# Patient Record
Sex: Male | Born: 1940
Health system: Southern US, Community
[De-identification: ages and names within clinical notes are randomized; demographics above are authoritative.]

## PROBLEM LIST (undated history)

## (undated) DIAGNOSIS — Z87442 Personal history of urinary calculi: Secondary | ICD-10-CM

## (undated) DIAGNOSIS — C61 Malignant neoplasm of prostate: Secondary | ICD-10-CM

## (undated) DIAGNOSIS — I10 Essential (primary) hypertension: Secondary | ICD-10-CM

## (undated) DIAGNOSIS — C181 Malignant neoplasm of appendix: Secondary | ICD-10-CM

## (undated) DIAGNOSIS — N201 Calculus of ureter: Secondary | ICD-10-CM

## (undated) DIAGNOSIS — K219 Gastro-esophageal reflux disease without esophagitis: Secondary | ICD-10-CM

## (undated) DIAGNOSIS — J4540 Moderate persistent asthma, uncomplicated: Secondary | ICD-10-CM

## (undated) DIAGNOSIS — I471 Supraventricular tachycardia, unspecified: Secondary | ICD-10-CM

## (undated) DIAGNOSIS — I480 Paroxysmal atrial fibrillation: Secondary | ICD-10-CM

## (undated) DIAGNOSIS — M7120 Synovial cyst of popliteal space [Baker], unspecified knee: Secondary | ICD-10-CM

## (undated) HISTORY — DX: Malignant neoplasm of appendix: C18.1

## (undated) HISTORY — DX: Paroxysmal atrial fibrillation: I48.0

## (undated) HISTORY — DX: Synovial cyst of popliteal space (Baker), unspecified knee: M71.20

## (undated) HISTORY — DX: Moderate persistent asthma, uncomplicated: J45.40

## (undated) HISTORY — PX: APPENDECTOMY: SHX54

## (undated) HISTORY — DX: Supraventricular tachycardia, unspecified: I47.10

## (undated) HISTORY — PX: OTHER SURGICAL HISTORY: SHX169

---

## 2012-01-15 ENCOUNTER — Emergency Department (HOSPITAL_BASED_OUTPATIENT_CLINIC_OR_DEPARTMENT_OTHER): Payer: Medicare Other

## 2012-01-15 ENCOUNTER — Encounter (HOSPITAL_BASED_OUTPATIENT_CLINIC_OR_DEPARTMENT_OTHER): Payer: Self-pay | Admitting: Emergency Medicine

## 2012-01-15 ENCOUNTER — Emergency Department (HOSPITAL_BASED_OUTPATIENT_CLINIC_OR_DEPARTMENT_OTHER)
Admission: EM | Admit: 2012-01-15 | Discharge: 2012-01-15 | Disposition: A | Discharge status: Home / Self Care | Payer: Medicare Other | Attending: Emergency Medicine | Admitting: Emergency Medicine

## 2012-01-15 DIAGNOSIS — Z87891 Personal history of nicotine dependence: Secondary | ICD-10-CM | POA: Insufficient documentation

## 2012-01-15 DIAGNOSIS — M79609 Pain in unspecified limb: Secondary | ICD-10-CM | POA: Insufficient documentation

## 2012-01-15 DIAGNOSIS — M7120 Synovial cyst of popliteal space [Baker], unspecified knee: Secondary | ICD-10-CM | POA: Insufficient documentation

## 2012-01-15 NOTE — ED Notes (Signed)
Patient back from  X-ray 

## 2012-01-15 NOTE — ED Notes (Signed)
Patient transported to X-ray 

## 2012-01-15 NOTE — ED Provider Notes (Signed)
History     CSN: [id number]  Arrival date & time 01/15/12  0909   First MD Initiated Contact with Patient 01/15/12 1018      Chief Complaint  Patient presents with  . Leg Pain    Lt posterior lower leg  . Knee Pain    Lt posterior knee    (Consider location/radiation/quality/duration/timing/severity/associated sxs/prior treatment) Patient is a 71 y.o. male presenting with leg pain and knee pain. The history is provided by the patient.  Leg Pain  The incident occurred more than 2 days ago. Incident location: walking and stretching his leg when he felt a pop. The injury mechanism is unknown. The pain is present in the left knee. The quality of the pain is described as aching and throbbing. The pain is at a severity of 8/10. The pain is moderate. The pain has been constant since onset. Pertinent negatives include no numbness, no inability to bear weight, no loss of motion, no loss of sensation and no tingling. Nothing aggravates the symptoms. He has tried NSAIDs for the symptoms. The treatment provided no relief.  Knee Pain    History reviewed. No pertinent past medical history.  Past Surgical History  Procedure Date  . Appendectomy     History reviewed. No pertinent family history.  History  Substance Use Topics  . Smoking status: Former Smoker    Types: Cigarettes  . Smokeless tobacco: Not on file  . Alcohol Use: 0.6 oz/week    1 Glasses of wine per week      Review of Systems  Constitutional: Negative for fever.  Neurological: Negative for tingling, weakness and numbness.  All other systems reviewed and are negative.    Allergies  Review of patient's allergies indicates no known allergies.  Home Medications  No current outpatient prescriptions on file.  BP 153/72  Pulse 61  Temp 98.7 F (37.1 C) (Oral)  Resp 18  Ht 5\' 9"  (1.753 m)  Wt 183 lb (83.008 kg)  BMI 27.02 kg/m2  SpO2 97%  Physical Exam  Nursing note and vitals reviewed. Constitutional:  He is oriented to person, place, and time. He appears well-developed and well-nourished. No distress.  HENT:  Head: Normocephalic and atraumatic.  Eyes: EOM are normal. Pupils are equal, round, and reactive to light.  Musculoskeletal:       Left knee: He exhibits swelling. He exhibits no ecchymosis and no bony tenderness. No medial joint line, no lateral joint line, no MCL, no LCL and no patellar tendon tenderness noted.       Legs: Neurological: He is alert and oriented to person, place, and time. He has normal strength. No sensory deficit.  Skin: Skin is warm and dry. No rash noted. No erythema.    ED Course  Procedures (including critical care time)  Labs Reviewed - No data to display Dg Knee 1-2 Views Left  01/15/2012  *RADIOLOGY REPORT*  Clinical Data: Knee pain.  No known injury.  LEFT KNEE - 1-2 VIEW  Comparison: None  Findings: Early degenerative changes with early spurring in the medial and patellofemoral compartments.  There may be a small joint effusion. No acute bony abnormality.  Specifically, no fracture, subluxation, or dislocation.  Soft tissues are intact.  IMPRESSION: No acute bony abnormality.   Original Report Authenticated By: Cyndie Chime, M.D.    [place] Extrem Low Left Comp  01/15/2012  *RADIOLOGY REPORT*  Clinical Data: Posterior knee pain, swelling.  ULTRASOUND LEFT LOWER EXTREMITY LIMITED  Technique:  Ultrasound  examination of the region of interest in the left lower extremity was performed.  Comparison:  None.  Findings: There is a complex cystic collection in the popliteal fossa measuring 8.1 x 4.1 x 2.6 cm.  Internal septations and debris.  No internal blood flow.  Findings compatible with large complex Baker's cyst.  IMPRESSION: 8.1 cm complex left Baker's cyst.   Original Report Authenticated By: Cyndie Chime, M.D.      1. Baker's cyst of knee       MDM   Patient with pain and swelling behind the left knee. He states his knee and felt tight for months to  3 days ago he felt a pop and now has a swelling. Concern for possible ruptured Baker's cyst. He has significant swelling and pain in the posterior aspect of the left knee. He is able to flex and extend his knee normally. Neurovascularly intact. No external signs of bruising and normal function of the calf muscle as well as thigh and hamstring.  10:44 AM Will start with plain films of the knee.  12:03 PM Knee films are unremarkable follow up ultrasound showed an 8.1 cm Baker cyst. Patient was given followup with Dr. Alford Highland, MD 01/15/12 [phone]

## 2012-01-15 NOTE — ED Notes (Signed)
Patient transported to Ultrasound 

## 2012-01-15 NOTE — ED Notes (Signed)
Pt reports Lt knee pain/ Lt leg pain. Pt denies injury. Pt states occasional feeling numbness.

## 2012-01-18 ENCOUNTER — Other Ambulatory Visit (HOSPITAL_COMMUNITY): Payer: Self-pay | Admitting: Orthopedic Surgery

## 2012-01-18 DIAGNOSIS — M7120 Synovial cyst of popliteal space [Baker], unspecified knee: Secondary | ICD-10-CM

## 2012-01-19 ENCOUNTER — Other Ambulatory Visit: Payer: Self-pay | Admitting: Radiology

## 2012-01-22 ENCOUNTER — Ambulatory Visit (HOSPITAL_COMMUNITY)
Admission: RE | Admit: 2012-01-22 | Discharge: 2012-01-22 | Disposition: A | Discharge status: Home / Self Care | Payer: Medicare Other | Source: Ambulatory Visit | Attending: Orthopedic Surgery | Admitting: Orthopedic Surgery

## 2012-01-22 ENCOUNTER — Ambulatory Visit (HOSPITAL_COMMUNITY): Payer: Medicare Other

## 2012-01-22 DIAGNOSIS — M7120 Synovial cyst of popliteal space [Baker], unspecified knee: Secondary | ICD-10-CM

## 2012-01-22 NOTE — Procedures (Signed)
US aspiration Bakers Cyst Left 25ml pale yellow fluid No complication No blood loss. See complete dictation in [facility]

## 2012-04-18 ENCOUNTER — Encounter: Payer: Self-pay | Admitting: Critical Care Medicine

## 2012-04-18 ENCOUNTER — Ambulatory Visit (INDEPENDENT_AMBULATORY_CARE_PROVIDER_SITE_OTHER): Payer: Medicare Other | Admitting: Critical Care Medicine

## 2012-04-18 ENCOUNTER — Ambulatory Visit (HOSPITAL_BASED_OUTPATIENT_CLINIC_OR_DEPARTMENT_OTHER)
Admission: RE | Admit: 2012-04-18 | Discharge: 2012-04-18 | Disposition: A | Discharge status: Home / Self Care | Payer: Medicare Other | Source: Ambulatory Visit | Attending: Critical Care Medicine | Admitting: Critical Care Medicine

## 2012-04-18 VITALS — BP 150/88 | HR 77 | Temp 97.6°F | Ht 70.0 in | Wt 194.0 lb

## 2012-04-18 DIAGNOSIS — K219 Gastro-esophageal reflux disease without esophagitis: Secondary | ICD-10-CM

## 2012-04-18 DIAGNOSIS — R0600 Dyspnea, unspecified: Secondary | ICD-10-CM

## 2012-04-18 DIAGNOSIS — R1310 Dysphagia, unspecified: Secondary | ICD-10-CM

## 2012-04-18 DIAGNOSIS — J45902 Unspecified asthma with status asthmaticus: Secondary | ICD-10-CM

## 2012-04-18 DIAGNOSIS — R0602 Shortness of breath: Secondary | ICD-10-CM | POA: Insufficient documentation

## 2012-04-18 DIAGNOSIS — R0989 Other specified symptoms and signs involving the circulatory and respiratory systems: Secondary | ICD-10-CM

## 2012-04-18 MED ORDER — ALBUTEROL SULFATE HFA 108 (90 BASE) MCG/ACT IN AERS: 2.0000 | INHALATION_SPRAY | Freq: Four times a day (QID) | RESPIRATORY_TRACT | Status: DC | PRN

## 2012-04-18 MED ORDER — OMEPRAZOLE 20 MG PO CPDR: 20.0000 mg | DELAYED_RELEASE_CAPSULE | Freq: Every day | ORAL | Status: DC

## 2012-04-18 MED ORDER — MOMETASONE FUROATE 220 MCG/INH IN AEPB: 2.0000 | INHALATION_SPRAY | Freq: Every day | RESPIRATORY_TRACT | Status: DC

## 2012-04-18 NOTE — Assessment & Plan Note (Signed)
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 In concerned about the possibility of esophageal stricture driving the patient's dyspnea and wheezing. Note pulmonary function show mild to moderate airway obstruction.  Plan Start Asmanex two puff daily Albuterol 1-2 puff every 4 hours as needed for wheezing or short of breath Start omeprazole 20mg  daily Follow strict reflux diet Obtain an esophageal barium swallow, will do at [facility]

## 2012-04-18 NOTE — Progress Notes (Signed)
Quick Note:  Notify the patient that the Xray is stable and no pneumonia No change in medications are recommended. Continue current meds as prescribed at last office visit ______ 

## 2012-04-18 NOTE — Progress Notes (Signed)
Subjective:    Patient ID: John Livingston, male    DOB: 25-Feb-1941, 72 y.o.   MRN: [id number]  HPI Comments: Dyspnea complaint with exertion only. 56yrs hx  Shortness of Breath This is a chronic problem. The current episode started more than 1 year ago. The problem occurs daily (exertional only, getting worse, working, exercise, going up hill). The problem has been gradually worsening. The average episode lasts 15 minutes. Associated symptoms include PND, rhinorrhea and wheezing. Pertinent negatives include no abdominal pain, chest pain, claudication, coryza, ear pain, fever, headaches, hemoptysis, leg pain, leg swelling, neck pain, orthopnea, rash, sore throat, sputum production or vomiting. The symptoms are aggravated by any activity, exercise and lying flat. Associated symptoms comments: Pt notes nocturnal wheezing and will awaken with same . Risk factors include smoking. He has tried nothing for the symptoms. There is no history of allergies, aspirin allergies, asthma, bronchiolitis, CAD, chronic lung disease, COPD, DVT, a heart failure, PE, pneumonia or a recent surgery.   Past Medical History  Diagnosis Date  . Baker's cyst of knee     left  . Dyspnea   . Cancer of appendix      Family History  Problem Relation Age of Onset  . Asthma Son     as a child  . Heart disease Mother   . Prostate cancer Father      History   Social History  . Marital Status: Single    Spouse Name: N/A    Number of Children: N/A  . Years of Education: N/A   Occupational History  . Buisness Owner     [employer]   Social History Main Topics  . Smoking status: Former Smoker -- 1.0 packs/day for 20 years    Types: Cigarettes    Quit date: 03/20/1978  . Smokeless tobacco: Never Used  . Alcohol Use: 0.6 oz/week    1 Glasses of wine per week     Comment: a glass of wine nightly  . Drug Use: No  . Sexually Active: Not on file   Other Topics Concern  . Not on file   Social History  Narrative  . No narrative on file     No Known Allergies   No outpatient prescriptions prior to visit.    Last reviewed on 04/18/2012 11:19 AM by Storm Frisk, MD      Review of Systems  Constitutional: Negative for fever, chills, diaphoresis, activity change, appetite change, fatigue and unexpected weight change.  HENT: Positive for hearing loss, rhinorrhea, trouble swallowing and tinnitus. Negative for ear pain, nosebleeds, congestion, sore throat, facial swelling, sneezing, mouth sores, neck pain, neck stiffness, dental problem, voice change, postnasal drip, sinus pressure and ear discharge.        Diff swallow bread.  Feels like is stuck in the throat Prior Esophageal dilation 2-3 yrs ago, cannot remember the MD name at [facility]  Eyes: Negative for photophobia, discharge, itching and visual disturbance.  Respiratory: Positive for shortness of breath and wheezing. Negative for apnea, cough, hemoptysis, sputum production, choking, chest tightness and stridor.   Cardiovascular: Positive for palpitations and PND. Negative for chest pain, orthopnea, claudication and leg swelling.  Gastrointestinal: Negative for nausea, vomiting, abdominal pain, constipation, blood in stool and abdominal distention.  Genitourinary: Negative for dysuria, urgency, frequency, hematuria, flank pain, decreased urine volume and difficulty urinating.  Musculoskeletal: Negative for myalgias, back pain, joint swelling, arthralgias and gait problem.  Skin: Negative for color change, pallor and rash.  Neurological: Negative for dizziness, tremors, seizures, syncope, speech difficulty, weakness, light-headedness, numbness and headaches.  Hematological: Negative for adenopathy. Does not bruise/bleed easily.  Psychiatric/Behavioral: Negative for confusion, sleep disturbance and agitation. The patient is not nervous/anxious.        Objective:   Physical Exam Filed Vitals:   04/18/12 1058  BP: 150/88  Pulse: 77   Temp: 97.6 F (36.4 C)  TempSrc: Oral  Height: 5\' 10"  (1.778 m)  Weight: 194 lb (87.998 kg)  SpO2: 92%    Gen: Pleasant, well-nourished, in no distress,  normal affect  ENT: No lesions,  mouth clear,  oropharynx clear, no postnasal drip  Neck: No JVD, no TMG, no carotid bruits  Lungs: No use of accessory muscles, no dullness to percussion, expired wheezes and lower airway and moderate pseudo-wheeze and upper airway  Cardiovascular: RRR, heart sounds normal, no murmur or gallops, no peripheral edema  Abdomen: soft and NT, no HSM,  BS normal  Musculoskeletal: No deformities, no cyanosis or clubbing  Neuro: alert, non focal  Skin: Warm, no lesions or rashes  Dg Chest 2 View  04/18/2012  *RADIOLOGY REPORT*  Clinical Data: Shortness of breath  CHEST - 2 VIEW  Comparison: None.  Findings: Cardiomediastinal silhouette is unremarkable.  No acute infiltrate or pleural effusion.  No pulmonary edema.  Bony thorax is unremarkable.  IMPRESSION: No active disease.   Original Report Authenticated By: Natasha Mead, M.D.    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 In concerned about the possibility of esophageal stricture driving the patient's dyspnea and wheezing. Note pulmonary function show mild to moderate airway obstruction.  Plan Start Asmanex two puff daily Albuterol 1-2 puff every 4 hours as needed for wheezing or short of breath Start omeprazole 20mg  daily Follow strict reflux diet Obtain an esophageal barium swallow, will do at [facility]   Updated Medication List Outpatient Encounter Prescriptions as of 04/18/2012  Medication Sig Dispense Refill  . Aromatic Inhalants (VICKS VAPOR INHALER IN) Inhale into the lungs at bedtime.      .  clobetasol cream (TEMOVATE) 0.05 % Apply topically. As directed      . Doxylamine Succinate, Sleep, (SLEEP AID PO) Take 2 tablets by mouth at bedtime.      Marland Kitchen albuterol (PROVENTIL HFA;VENTOLIN HFA) 108 (90 BASE) MCG/ACT inhaler Inhale 2 puffs into the lungs every 6 (six) hours as needed for wheezing or shortness of breath.  1 Inhaler  6  . mometasone (ASMANEX 120 METERED DOSES) 220 MCG/INH inhaler Inhale 2 puffs into the lungs daily.  1 Inhaler  12  . omeprazole (PRILOSEC) 20 MG capsule Take 1 capsule (20 mg total) by mouth daily.  30 capsule  4

## 2012-04-18 NOTE — Patient Instructions (Addendum)
Start Asmanex two puff daily Albuterol 1-2 puff every 4 hours as needed for wheezing or short of breath Start omeprazole 20mg  daily Follow strict reflux diet Obtain an esophageal barium swallow, will do at [facility] Obtain Chest xray today Return 6 weeks for recheck

## 2012-04-19 ENCOUNTER — Other Ambulatory Visit: Payer: Self-pay | Admitting: Critical Care Medicine

## 2012-04-19 DIAGNOSIS — R1310 Dysphagia, unspecified: Secondary | ICD-10-CM

## 2012-04-22 ENCOUNTER — Telehealth: Payer: Self-pay | Admitting: Critical Care Medicine

## 2012-04-22 NOTE — Telephone Encounter (Signed)
John Livingston has gone for the day, I will put cpt code on referral form, so pt can have this barium swallow @ [facility] .Kandice Hams

## 2012-04-22 NOTE — Progress Notes (Signed)
Quick Note:  Called, spoke with pt. Informed him of cxr results and recs per Dr. Wright. He verbalized understanding of this and voiced no further questions or concerns at this time. ______ 

## 2012-04-22 NOTE — Telephone Encounter (Signed)
Spoke with Steward Drone @ [facility] pt has esophageal barium swallow scheduled 04/24/12, informed her dx is Dysphagia; 787.20. They also need CPT code to [place] put [place] referral [place] [address] which she says is fl Barium swallow xray. In order for pt to have this done they need this documented on referral form. For [facility] orders.  Crystal can you please document this on order.  I can bring the faxed order to you and it can be handwritten on order form.  Thanks!

## 2012-04-23 NOTE — Telephone Encounter (Signed)
Per Alida, this has been taken care of.  Nothing further needed at this time.

## 2012-04-30 ENCOUNTER — Telehealth: Payer: Self-pay | Admitting: Critical Care Medicine

## 2012-04-30 NOTE — Telephone Encounter (Signed)
Call pt and tell him barium swallow was NORMAL.  No swallowing or esophagus issues. No medication changes Keep next OV

## 2012-05-01 NOTE — Telephone Encounter (Signed)
Called, spoke with pt.  Informed him of barium swallow results and recs per PW.  He verbalized understanding of this, is aware to keep scheduled OV on March 13 at 9 am in [facility], and voiced no further questions or concerns at this time.

## 2012-05-20 ENCOUNTER — Encounter: Payer: Self-pay | Admitting: Critical Care Medicine

## 2012-05-30 ENCOUNTER — Ambulatory Visit (INDEPENDENT_AMBULATORY_CARE_PROVIDER_SITE_OTHER): Payer: Medicare Other | Admitting: Critical Care Medicine

## 2012-05-30 ENCOUNTER — Encounter: Payer: Self-pay | Admitting: Critical Care Medicine

## 2012-05-30 VITALS — BP 118/78 | HR 60 | Temp 97.8°F | Ht 69.5 in | Wt 194.0 lb

## 2012-05-30 DIAGNOSIS — J45902 Unspecified asthma with status asthmaticus: Secondary | ICD-10-CM

## 2012-05-30 NOTE — Patient Instructions (Addendum)
Continue asmanex two puff daily Return 6 months

## 2012-05-30 NOTE — Progress Notes (Signed)
Subjective:    Patient ID: John Livingston, male    DOB: [birth year], 72 y.o.   MRN: [id number]  HPI 05/30/2012 Since her last visit the patient is improved. The patient had been using Asmanex regularly but note is that the samples lockup when he twists the device. He does have a prescription from the pharmacy and is just now picked this up. The patient denies any cough wheezing or excess mucus production. The patient had a barium swallow which showed no stricture and mild reflux with vital hernia noted no other source of dysphagia seen. Pt denies any significant sore throat, nasal congestion or excess secretions, fever, chills, sweats, unintended weight loss, pleurtic or exertional chest pain, orthopnea PND, or leg swelling Pt denies any increase in rescue therapy over baseline, denies waking up needing it or having any early am or nocturnal exacerbations of coughing/wheezing/or dyspnea. Pt also denies any obvious fluctuation in symptoms with  weather or environmental change or other alleviating or aggravating factors  PUL ASTHMA HISTORY 05/30/2012  Symptoms 0-2 days/week  Nighttime awakenings 0-2/month  Interference with activity Minor limitations  SABA use 0-2 days/wk  Exacerbations requiring oral steroids 0-1 / year     Past Medical History  Diagnosis Date  . Baker's cyst of knee     left  . Moderate persistent asthma   . Cancer of appendix      Family History  Problem Relation Age of Onset  . Asthma Son     as a child  . Heart disease Mother   . Prostate cancer Father      History   Social History  . Marital Status: Single    Spouse Name: N/A    Number of Children: N/A  . Years of Education: N/A   Occupational History  . Buisness Owner     [employer]   Social History Main Topics  . Smoking status: Former Smoker -- 1.00 packs/day for 20 years    Types: Cigarettes    Quit date: 03/20/1978  . Smokeless tobacco: Never Used  . Alcohol Use: 0.6 oz/week    1  Glasses of wine per week     Comment: a glass of wine nightly  . Drug Use: No  . Sexually Active: Not on file   Other Topics Concern  . Not on file   Social History Narrative  . No narrative on file     No Known Allergies   Outpatient Prescriptions Prior to Visit  Medication Sig Dispense Refill  . albuterol (PROVENTIL HFA;VENTOLIN HFA) 108 (90 BASE) MCG/ACT inhaler Inhale 2 puffs into the lungs every 6 (six) hours as needed for wheezing or shortness of breath.  1 Inhaler  6  . Aromatic Inhalants (VICKS VAPOR INHALER IN) Inhale into the lungs at bedtime.      . clobetasol cream (TEMOVATE) 0.05 % Apply topically. As directed      . Doxylamine Succinate, Sleep, (SLEEP AID PO) Take 2 tablets by mouth at bedtime.      Marland Kitchen omeprazole (PRILOSEC) 20 MG capsule Take 1 capsule (20 mg total) by mouth daily.  30 capsule  4  . mometasone (ASMANEX 120 METERED DOSES) 220 MCG/INH inhaler Inhale 2 puffs into the lungs daily.  1 Inhaler  12   No facility-administered medications prior to visit.        Review of Systems  Constitutional: Negative for chills, diaphoresis, activity change, appetite change, fatigue and unexpected weight change.  HENT: Positive for hearing loss,  trouble swallowing and tinnitus. Negative for nosebleeds, congestion, facial swelling, sneezing, mouth sores, neck stiffness, dental problem, voice change, postnasal drip, sinus pressure and ear discharge.        Diff swallow bread.  Feels like is stuck in the throat Prior Esophageal dilation 2-3 yrs ago, cannot remember the MD name at [facility]  Eyes: Negative for photophobia, discharge, itching and visual disturbance.  Respiratory: Negative for apnea, cough, choking, chest tightness and stridor.   Cardiovascular: Positive for palpitations.  Gastrointestinal: Negative for nausea, constipation, blood in stool and abdominal distention.  Genitourinary: Negative for dysuria, urgency, frequency, hematuria, flank pain, decreased urine  volume and difficulty urinating.  Musculoskeletal: Negative for myalgias, back pain, joint swelling, arthralgias and gait problem.  Skin: Negative for color change and pallor.  Neurological: Negative for dizziness, tremors, seizures, syncope, speech difficulty, weakness, light-headedness and numbness.  Hematological: Negative for adenopathy. Does not bruise/bleed easily.  Psychiatric/Behavioral: Negative for confusion, sleep disturbance and agitation. The patient is not nervous/anxious.        Objective:   Physical Exam  Filed Vitals:   05/30/12 0906  BP: 118/78  Pulse: 60  Temp: 97.8 F (36.6 C)  TempSrc: Oral  Height: 5' 9.5" (1.765 m)  Weight: 194 lb (87.998 kg)  SpO2: 98%    Gen: Pleasant, well-nourished, in no distress,  normal affect  ENT: No lesions,  mouth clear,  oropharynx clear, no postnasal drip  Neck: No JVD, no TMG, no carotid bruits  Lungs: No use of accessory muscles, no dullness to percussion, Much clearer with decreased wheeze  Cardiovascular: RRR, heart sounds normal, no murmur or gallops, no peripheral edema  Abdomen: soft and NT, no HSM,  BS normal  Musculoskeletal: No deformities, no cyanosis or clubbing  Neuro: alert, non focal  Skin: Warm, no lesions or rashes      Assessment & Plan:   Moderate persistent asthma with reflux disease Moderate persistent asthma with inability to use Asmanex inhaler due to mechanical failure of device GERD trigger of reflux Plan The patient was reinstructed as to proper use of the dry powdered inhaler device and additional samples were issued If the patient continues to have difficulty operating this device the patient will be migrated to different inhaled steroid Continued proton pump inhibitor for reflux which is a major trigger for the patient's asthma Return 6 months    Updated Medication List Outpatient Encounter Prescriptions as of 05/30/2012  Medication Sig Dispense Refill  . albuterol (PROVENTIL  HFA;VENTOLIN HFA) 108 (90 BASE) MCG/ACT inhaler Inhale 2 puffs into the lungs every 6 (six) hours as needed for wheezing or shortness of breath.  1 Inhaler  6  . Aromatic Inhalants (VICKS VAPOR INHALER IN) Inhale into the lungs at bedtime.      . clobetasol cream (TEMOVATE) 0.05 % Apply topically. As directed      . Doxylamine Succinate, Sleep, (SLEEP AID PO) Take 2 tablets by mouth at bedtime.      Marland Kitchen omeprazole (PRILOSEC) 20 MG capsule Take 1 capsule (20 mg total) by mouth daily.  30 capsule  4  . TRAVATAN Z 0.004 % SOLN ophthalmic solution Place 1 drop into both eyes 2 (two) times daily.      . mometasone (ASMANEX 120 METERED DOSES) 220 MCG/INH inhaler Inhale 2 puffs into the lungs daily.  1 Inhaler  12   No facility-administered encounter medications on file as of 05/30/2012.

## 2012-05-30 NOTE — Assessment & Plan Note (Signed)
Moderate persistent asthma with inability to use Asmanex inhaler due to mechanical failure of device GERD trigger of reflux Plan The patient was reinstructed as to proper use of the dry powdered inhaler device and additional samples were issued If the patient continues to have difficulty operating this device the patient will be migrated to different inhaled steroid Continued proton pump inhibitor for reflux which is a major trigger for the patient's asthma Return 6 months

## 2012-11-12 ENCOUNTER — Telehealth: Payer: Self-pay | Admitting: Critical Care Medicine

## 2012-11-12 NOTE — Telephone Encounter (Signed)
pt states he no longer needs to see PW. Will call us if he needs [place] in the future.

## 2013-04-29 ENCOUNTER — Emergency Department (HOSPITAL_BASED_OUTPATIENT_CLINIC_OR_DEPARTMENT_OTHER)
Admission: EM | Admit: 2013-04-29 | Discharge: 2013-04-30 | Disposition: A | Discharge status: Home / Self Care | Payer: Medicare Other | Attending: Emergency Medicine | Admitting: Emergency Medicine

## 2013-04-29 ENCOUNTER — Emergency Department (HOSPITAL_BASED_OUTPATIENT_CLINIC_OR_DEPARTMENT_OTHER): Payer: Medicare Other

## 2013-04-29 ENCOUNTER — Encounter (HOSPITAL_BASED_OUTPATIENT_CLINIC_OR_DEPARTMENT_OTHER): Payer: Self-pay | Admitting: Emergency Medicine

## 2013-04-29 DIAGNOSIS — Z79899 Other long term (current) drug therapy: Secondary | ICD-10-CM | POA: Insufficient documentation

## 2013-04-29 DIAGNOSIS — J45909 Unspecified asthma, uncomplicated: Secondary | ICD-10-CM | POA: Insufficient documentation

## 2013-04-29 DIAGNOSIS — K219 Gastro-esophageal reflux disease without esophagitis: Secondary | ICD-10-CM | POA: Insufficient documentation

## 2013-04-29 DIAGNOSIS — Z8509 Personal history of malignant neoplasm of other digestive organs: Secondary | ICD-10-CM | POA: Insufficient documentation

## 2013-04-29 DIAGNOSIS — Z87891 Personal history of nicotine dependence: Secondary | ICD-10-CM | POA: Insufficient documentation

## 2013-04-29 DIAGNOSIS — I10 Essential (primary) hypertension: Secondary | ICD-10-CM | POA: Insufficient documentation

## 2013-04-29 DIAGNOSIS — Z8739 Personal history of other diseases of the musculoskeletal system and connective tissue: Secondary | ICD-10-CM | POA: Insufficient documentation

## 2013-04-29 DIAGNOSIS — IMO0002 Reserved for concepts with insufficient information to code with codable children: Secondary | ICD-10-CM | POA: Insufficient documentation

## 2013-04-29 DIAGNOSIS — K859 Acute pancreatitis without necrosis or infection, unspecified: Secondary | ICD-10-CM

## 2013-04-29 LAB — URINALYSIS, ROUTINE W REFLEX MICROSCOPIC
BILIRUBIN URINE: NEGATIVE
Glucose, UA: NEGATIVE mg/dL
HGB URINE DIPSTICK: NEGATIVE
Ketones, ur: NEGATIVE mg/dL
Leukocytes, UA: NEGATIVE
Nitrite: NEGATIVE
PROTEIN: NEGATIVE mg/dL
Specific Gravity, Urine: 1.026 (ref 1.005–1.030)
UROBILINOGEN UA: 1 mg/dL (ref 0.0–1.0)
pH: 5.5 (ref 5.0–8.0)

## 2013-04-29 LAB — COMPREHENSIVE METABOLIC PANEL
ALT: 13 U/L (ref 0–53)
AST: 17 U/L (ref 0–37)
Albumin: 4.2 g/dL (ref 3.5–5.2)
Alkaline Phosphatase: 71 U/L (ref 39–117)
BUN: 16 mg/dL (ref 6–23)
CALCIUM: 9.3 mg/dL (ref 8.4–10.5)
CO2: 26 mEq/L (ref 19–32)
Chloride: 105 mEq/L (ref 96–112)
Creatinine, Ser: 1.1 mg/dL (ref 0.50–1.35)
GFR calc non Af Amer: 64 mL/min — ABNORMAL LOW (ref >90)
GFR, EST AFRICAN AMERICAN: 74 mL/min — AB (ref >90)
GLUCOSE: 147 mg/dL — AB (ref 70–99)
Potassium: 3.7 mEq/L (ref 3.7–5.3)
SODIUM: 144 meq/L (ref 137–147)
TOTAL PROTEIN: 7.4 g/dL (ref 6.0–8.3)
Total Bilirubin: 0.4 mg/dL (ref 0.3–1.2)

## 2013-04-29 LAB — CBC WITH DIFFERENTIAL/PLATELET
BASOS ABS: 0 10*3/uL (ref 0.0–0.1)
Basophils Relative: 1 % (ref 0–1)
EOS ABS: 0.3 10*3/uL (ref 0.0–0.7)
EOS PCT: 5 % (ref 0–5)
HCT: 40.8 % (ref 39.0–52.0)
Hemoglobin: 14.5 g/dL (ref 13.0–17.0)
Lymphocytes Relative: 26 % (ref 12–46)
Lymphs Abs: 1.6 10*3/uL (ref 0.7–4.0)
MCH: 32.1 pg (ref 26.0–34.0)
MCHC: 35.5 g/dL (ref 30.0–36.0)
MCV: 90.3 fL (ref 78.0–100.0)
Monocytes Absolute: 0.9 10*3/uL (ref 0.1–1.0)
Monocytes Relative: 16 % — ABNORMAL HIGH (ref 3–12)
Neutro Abs: 3.2 10*3/uL (ref 1.7–7.7)
Neutrophils Relative %: 53 % (ref 43–77)
PLATELETS: 185 10*3/uL (ref 150–400)
RBC: 4.52 MIL/uL (ref 4.22–5.81)
RDW: 13.5 % (ref 11.5–15.5)
WBC: 6 10*3/uL (ref 4.0–10.5)

## 2013-04-29 LAB — CG4 I-STAT (LACTIC ACID): Lactic Acid, Venous: 1.21 mmol/L (ref 0.5–2.2)

## 2013-04-29 LAB — TROPONIN I: Troponin I: <0.3 ng/mL (ref <0.30)

## 2013-04-29 LAB — LIPASE, BLOOD: Lipase: 69 U/L — ABNORMAL HIGH (ref 11–59)

## 2013-04-29 MED ORDER — ONDANSETRON HCL 4 MG/2ML IJ SOLN
4.0000 mg | Freq: Once | INTRAMUSCULAR | Status: AC
  Administered 2013-04-29: 4 mg via INTRAVENOUS
  Filled 2013-04-29: qty 2

## 2013-04-29 MED ORDER — IOHEXOL 350 MG/ML SOLN
100.0000 mL | Freq: Once | INTRAVENOUS | Status: AC | PRN
  Administered 2013-04-29: 100 mL via INTRAVENOUS

## 2013-04-29 MED ORDER — MORPHINE SULFATE 4 MG/ML IJ SOLN
4.0000 mg | Freq: Once | INTRAMUSCULAR | Status: AC
  Administered 2013-04-29: 4 mg via INTRAVENOUS
  Filled 2013-04-29: qty 1

## 2013-04-29 MED ORDER — GI COCKTAIL ~~LOC~~
30.0000 mL | Freq: Once | ORAL | Status: AC
  Administered 2013-04-29: 30 mL via ORAL
  Filled 2013-04-29: qty 30

## 2013-04-29 NOTE — ED Notes (Signed)
Pt complains epigastric pain that started about 1 hour ago that radiates into back.  Denies n/v/d.

## 2013-04-29 NOTE — ED Provider Notes (Signed)
CSN: [id number]     Arrival date & time 04/29/13  2136 History  This chart was scribed for John Dessert, MD by Celesta Gentile, ED Scribe. The patient was seen in room [address]. Patient's care was started at 9:52 PM.    Chief Complaint  Patient presents with  . Abdominal Pain   Patient is a 73 y.o. male presenting with abdominal pain. The history is provided by the patient. No language interpreter was used.  Abdominal Pain Pain location:  Epigastric Pain radiates to:  Back Pain severity:  Moderate Onset quality:  Sudden Duration:  1 hour Timing:  Constant Progression:  Worsening Chronicity:  New Relieved by:  Nothing Worsened by:  Deep breathing and palpation Ineffective treatments:  None tried Associated symptoms: no chest pain, no chills, no cough, no diarrhea, no fever, no nausea, no shortness of breath, no sore throat and no vomiting   Risk factors: no alcohol abuse and not obese    HPI Comments: KERWIN AUGUSTUS is a 73 y.o. male who presents to the Emergency Department complaining of constant epigastric abdominal/ lower substernal chest pain that started about an hour ago while the pt was sitting on the floor sorting through boxes.  Pt states that the pain radiates into his upper back.  Pt denies SOB.  Pt states the pain is worsened with palpation and when he breathes out.  Pt denies pain below his navel area.  Pt states that he last ate around 6:00PM.  He states that he had fried chicken without the skin, collard greens with vinegar, and bread.  Pt denies diarrhea and nausea.  Pt states that he had an appendectomy.  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  Pt states that he drinks 1 glass of wine daily.  Pt denies smoking.  Pt denies any heart conditions.  Pt denies diabetes and HTN.  However high blood pressure here.  Past Medical History  Diagnosis Date  . Baker's cyst of knee     left  . Moderate persistent asthma   .  Cancer of appendix    Past Surgical History  Procedure Laterality Date  . Appendectomy    . Cartilage surgery in nose     Family History  Problem Relation Age of Onset  . Asthma Son     as a child  . Heart disease Mother   . Prostate cancer Father    History  Substance Use Topics  . Smoking status: Former Smoker -- 1.00 packs/day for 20 years    Types: Cigarettes    Quit date: 03/20/1978  . Smokeless tobacco: Never Used  . Alcohol Use: 0.6 oz/week    1 Glasses of wine per week     Comment: a glass of wine nightly    Review of Systems  Constitutional: Negative for fever and chills.  HENT: Negative for congestion, rhinorrhea and sore throat.   Respiratory: Negative for cough and shortness of breath.   Cardiovascular: Negative for chest pain.  Gastrointestinal: Positive for abdominal pain. Negative for nausea, vomiting and diarrhea.  Musculoskeletal: Negative for back pain.  Skin: Negative for color change and rash.  Neurological: Negative for syncope.  Psychiatric/Behavioral: Negative for behavioral problems and confusion.  All other systems reviewed and are negative.      Allergies  Review of patient's allergies indicates no known allergies.  Home Medications   Current Outpatient Rx  Name  Route  Sig  Dispense  Refill  . albuterol (PROVENTIL HFA;VENTOLIN HFA) 108 (90 BASE) MCG/ACT inhaler   Inhalation   Inhale 2 puffs into the lungs every 6 (six) hours as needed for wheezing or shortness of breath.   1 Inhaler   6   . Aromatic Inhalants (VICKS VAPOR INHALER IN)   Inhalation   Inhale into the lungs at bedtime.         . clobetasol cream (TEMOVATE) 0.05 %   Topical   Apply topically. As directed         . Doxylamine Succinate, Sleep, (SLEEP AID PO)   Oral   Take 2 tablets by mouth at bedtime.         . mometasone (ASMANEX 120 METERED DOSES) 220 MCG/INH inhaler   Inhalation   Inhale 2 puffs into the lungs daily.   1 Inhaler   12   . omeprazole  (PRILOSEC) 20 MG capsule   Oral   Take 1 capsule (20 mg total) by mouth daily.   30 capsule   4   . TRAVATAN Z 0.004 % SOLN ophthalmic solution   Both Eyes   Place 1 drop into both eyes 2 (two) times daily.          Triage Vitals: BP 218/92  Pulse 69  Temp(Src) 98.1 F (36.7 C) (Oral)  Resp 16  Ht 5\' 9"  (1.753 m)  Wt 196 lb (88.905 kg)  BMI 28.93 kg/m2  SpO2 97% Physical Exam  Nursing note and vitals reviewed. Constitutional: He is oriented to person, place, and time. He appears well-developed and well-nourished. No distress.  HENT:  Head: Normocephalic and atraumatic.  Eyes: Conjunctivae and EOM are normal. Right eye exhibits no discharge. Left eye exhibits no discharge.  Neck: Neck supple. No tracheal deviation present.  Cardiovascular: Normal rate, regular rhythm and normal heart sounds.  Exam reveals no gallop and no friction rub.   No murmur heard. Pulmonary/Chest: Effort normal. No respiratory distress. He has no wheezes. He has no rales. He exhibits no tenderness.  Abdominal: Soft. He exhibits no distension and no mass. There is no tenderness. There is no rebound and no guarding.  Musculoskeletal: Normal range of motion.  Neurological: He is alert and oriented to person, place, and time.  Skin: Skin is warm and dry. No rash noted.  Psychiatric: He has a normal mood and affect. His behavior is normal. Judgment and thought content normal.    ED Course  Procedures (including critical care time) DIAGNOSTIC STUDIES: Oxygen Saturation is 97% on RA, normal by my interpretation.    COORDINATION OF CARE: 10:09 PM-Will order UA, chest x-ray, and Comprehensive metabolic panel.  Patient informed of current plan of treatment and evaluation and agrees with plan.     Labs Review Labs Reviewed  CBC WITH DIFFERENTIAL - Abnormal; Notable for the following:    Monocytes Relative 16 (*)    All other components within normal limits  COMPREHENSIVE METABOLIC PANEL - Abnormal;  Notable for the following:    Glucose, Bld 147 (*)    GFR calc non Af Amer 64 (*)    GFR calc Af Amer 74 (*)    All other components within normal limits  LIPASE, BLOOD - Abnormal; Notable for the following:    Lipase 69 (*)    All other components within normal limits  TROPONIN I  URINALYSIS, ROUTINE W REFLEX MICROSCOPIC  CG4 I-STAT (LACTIC ACID)   Imaging Review Dg Chest 2 View  04/29/2013   CLINICAL DATA:  Chest pain  EXAM: CHEST  2 VIEW  COMPARISON:  April 18, 2012  FINDINGS: The heart size and mediastinal contours are within normal limits. The aorta is tortuous. There is no focal infiltrate, pulmonary edema, or pleural effusion. The visualized skeletal structures are stable.  IMPRESSION: No active cardiopulmonary disease.   Electronically Signed   By: Abelardo Diesel M.D.   On: 04/29/2013 22:48    EKG Interpretation    Date/Time:  Tuesday April 29 2013 21:41:48 EST Ventricular Rate:  65 PR Interval:  176 QRS Duration: 108 QT Interval:  436 QTC Calculation: 453 R Axis:   69 Text Interpretation:  Normal sinus rhythm Normal ECG No significant change since last tracing Confirmed by Maryan Rued  MD, Wilhelmenia Addis ([id number]) on 04/29/2013 10:11:28 PM            MDM   Final diagnoses:  None   Patient presents with sudden onset of lower substernal and epigastric chest pain that radiates into the upper back that started approximately one hour ago. He has no other associated symptoms and is well appearing. However patient is hypertensive today. He has no prior history of hypertension and sees his PCP every 6 months for evaluation. Blood pressure is similar in bilateral upper extremities with normal pulses and new description appearing chest pain or pain radiating into the extremities. Patient does have a history of GERD and takes omeprazole.  EKG is within normal limits today. Pulses are intact in all 4 extremities. Symptoms could be related to esophageal spasm versus gallbladder  pathology however patient does not have significant right upper quadrant pain. Low suspicion for ACS given patient's symptoms and description of pain however pt has new significant HTN and dissection is also a possibility.  will evaluate chest x-ray and troponin. Low suspicion for mesenteric ischemia as patient is not truly complaining of abdominal tenderness. He denies any bowel or bladder complaints.  No radiation of pain into the limbs Patient given a GI cocktail to see if any improvement in the pain.  CBC, CMP, lipase, troponin, lactate, chest x-ray pending.  10:44 PM No improvement with GI cocktail.  Labs without significant findings except for mild elevation of lipase of 69.  CXR with tortuous aorta but no other findings.  Will get CTA of chest/abd pelvis for further evaluation.  Pt given morphine for pain.  I personally performed the services described in this documentation, which was scribed in my presence.  The recorded information has been reviewed and considered.    John Dessert, MD 05/01/13 [phone]

## 2013-04-29 NOTE — ED Notes (Signed)
Pt c/o diffuse abd pain and lower back pain x 1 hr

## 2013-04-30 MED ORDER — OXYCODONE-ACETAMINOPHEN 5-325 MG PO TABS: 1.0000 | ORAL_TABLET | Freq: Four times a day (QID) | ORAL | Status: DC | PRN

## 2013-04-30 MED ORDER — MORPHINE SULFATE 2 MG/ML IJ SOLN
2.0000 mg | Freq: Once | INTRAMUSCULAR | Status: AC
Start: 2013-04-30 — End: 2013-04-30
  Administered 2013-04-30: 2 mg via INTRAVENOUS

## 2013-04-30 MED ORDER — HYDROCHLOROTHIAZIDE 25 MG PO TABS: 25.0000 mg | ORAL_TABLET | Freq: Every day | ORAL | Status: DC

## 2013-04-30 MED ORDER — MORPHINE SULFATE 2 MG/ML IJ SOLN
INTRAMUSCULAR | Status: AC
  Filled 2013-04-30: qty 1

## 2013-04-30 NOTE — Discharge Instructions (Signed)
Acute Pancreatitis °Acute pancreatitis is a disease in which the pancreas becomes suddenly inflamed. The pancreas is a large gland located behind your stomach. The pancreas produces enzymes that help digest food. The pancreas also releases the hormones glucagon and insulin that help regulate blood sugar. Damage to the pancreas occurs when the digestive enzymes from the pancreas are activated and begin attacking the pancreas before being released into the intestine. Most acute attacks last a couple of days and can cause serious complications. Some people become dehydrated and develop low blood pressure. In severe cases, bleeding into the pancreas can lead to shock and can be life-threatening. The lungs, heart, and kidneys may fail. °CAUSES  °Pancreatitis can happen to anyone. In some cases, the cause is unknown. Most cases are caused by: °· Alcohol abuse. °· Gallstones. °Other less common causes are: °· Certain medicines. °· Exposure to certain chemicals. °· Infection. °· Damage caused by an accident (trauma). °· Abdominal surgery. °SYMPTOMS  °· Pain in the upper abdomen that may radiate to the back. °· Tenderness and swelling of the abdomen. °· Nausea and vomiting. °DIAGNOSIS  °Your caregiver will perform a physical exam. Blood and stool tests may be done to confirm the diagnosis. Imaging tests may also be done, such as X-rays, CT scans, or an ultrasound of the abdomen. °TREATMENT  °Treatment usually requires a stay in the hospital. Treatment may include: °· Pain medicine. °· Fluid replacement through an intravenous line (IV). °· Placing a tube in the stomach to remove stomach contents and control vomiting. °· Not eating for 3 or 4 days. This gives your pancreas a rest, because enzymes are not being produced that can cause further damage. °· Antibiotic medicines if your condition is caused by an infection. °· Surgery of the pancreas or gallbladder. °HOME CARE INSTRUCTIONS  °· Follow the diet advised by your  caregiver. This may involve avoiding alcohol and decreasing the amount of fat in your diet. °· Eat smaller, more frequent meals. This reduces the amount of digestive juices the pancreas produces. °· Drink enough fluids to keep your urine clear or pale yellow. °· Only take over-the-counter or prescription medicines as directed by your caregiver. °· Avoid drinking alcohol if it caused your condition. °· Do not smoke. °· Get plenty of rest. °· Check your blood sugar at home as directed by your caregiver. °· Keep all follow-up appointments as directed by your caregiver. °SEEK MEDICAL CARE IF:  °· You do not recover as quickly as expected. °· You develop new or worsening symptoms. °· You have persistent pain, weakness, or nausea. °· You recover and then have another episode of pain. °SEEK IMMEDIATE MEDICAL CARE IF:  °· You are unable to eat or keep fluids down. °· Your pain becomes severe. °· You have a fever or persistent symptoms for more than 2 to 3 days. °· You have a fever and your symptoms suddenly get worse. °· Your skin or the white part of your eyes turn yellow (jaundice). °· You develop vomiting. °· You feel dizzy, or you faint. °· Your blood sugar is high (over 300 mg/dL). °MAKE SURE YOU:  °· Understand these instructions. °· Will watch your condition. °· Will get help right away if you are not doing well or get worse. °Document Released: 03/06/2005 Document Revised: 09/05/2011 Document Reviewed: 06/15/2011 °[employer]® Patient Information ©2014 [employer]. ° °

## 2013-08-02 ENCOUNTER — Emergency Department (HOSPITAL_BASED_OUTPATIENT_CLINIC_OR_DEPARTMENT_OTHER)
Admission: EM | Admit: 2013-08-02 | Discharge: 2013-08-02 | Disposition: A | Discharge status: Home / Self Care | Payer: Medicare Other | Attending: Emergency Medicine | Admitting: Emergency Medicine

## 2013-08-02 ENCOUNTER — Encounter (HOSPITAL_BASED_OUTPATIENT_CLINIC_OR_DEPARTMENT_OTHER): Payer: Self-pay | Admitting: Emergency Medicine

## 2013-08-02 ENCOUNTER — Emergency Department (HOSPITAL_BASED_OUTPATIENT_CLINIC_OR_DEPARTMENT_OTHER): Payer: Medicare Other

## 2013-08-02 DIAGNOSIS — Z87891 Personal history of nicotine dependence: Secondary | ICD-10-CM | POA: Insufficient documentation

## 2013-08-02 DIAGNOSIS — N201 Calculus of ureter: Secondary | ICD-10-CM | POA: Insufficient documentation

## 2013-08-02 DIAGNOSIS — Z8509 Personal history of malignant neoplasm of other digestive organs: Secondary | ICD-10-CM | POA: Insufficient documentation

## 2013-08-02 DIAGNOSIS — Z79899 Other long term (current) drug therapy: Secondary | ICD-10-CM | POA: Insufficient documentation

## 2013-08-02 DIAGNOSIS — Z791 Long term (current) use of non-steroidal anti-inflammatories (NSAID): Secondary | ICD-10-CM | POA: Insufficient documentation

## 2013-08-02 DIAGNOSIS — Z87448 Personal history of other diseases of urinary system: Secondary | ICD-10-CM | POA: Insufficient documentation

## 2013-08-02 DIAGNOSIS — J45909 Unspecified asthma, uncomplicated: Secondary | ICD-10-CM | POA: Insufficient documentation

## 2013-08-02 LAB — URINE MICROSCOPIC-ADD ON

## 2013-08-02 LAB — URINALYSIS, ROUTINE W REFLEX MICROSCOPIC
BILIRUBIN URINE: NEGATIVE
Glucose, UA: NEGATIVE mg/dL
Ketones, ur: NEGATIVE mg/dL
Leukocytes, UA: NEGATIVE
Nitrite: NEGATIVE
Protein, ur: NEGATIVE mg/dL
Specific Gravity, Urine: 1.021 (ref 1.005–1.030)
Urobilinogen, UA: 0.2 mg/dL (ref 0.0–1.0)
pH: 5 (ref 5.0–8.0)

## 2013-08-02 LAB — CBC WITH DIFFERENTIAL/PLATELET
Basophils Absolute: 0 10*3/uL (ref 0.0–0.1)
Basophils Relative: 1 % (ref 0–1)
EOS ABS: 0.3 10*3/uL (ref 0.0–0.7)
Eosinophils Relative: 5 % (ref 0–5)
HCT: 40.6 % (ref 39.0–52.0)
HEMOGLOBIN: 14.6 g/dL (ref 13.0–17.0)
LYMPHS ABS: 1.1 10*3/uL (ref 0.7–4.0)
LYMPHS PCT: 23 % (ref 12–46)
MCH: 32.4 pg (ref 26.0–34.0)
MCHC: 36 g/dL (ref 30.0–36.0)
MCV: 90.2 fL (ref 78.0–100.0)
Monocytes Absolute: 0.8 10*3/uL (ref 0.1–1.0)
Monocytes Relative: 17 % — ABNORMAL HIGH (ref 3–12)
NEUTROS PCT: 55 % (ref 43–77)
Neutro Abs: 2.6 10*3/uL (ref 1.7–7.7)
Platelets: 190 10*3/uL (ref 150–400)
RBC: 4.5 MIL/uL (ref 4.22–5.81)
RDW: 13.5 % (ref 11.5–15.5)
WBC: 4.8 10*3/uL (ref 4.0–10.5)

## 2013-08-02 LAB — BASIC METABOLIC PANEL
BUN: 18 mg/dL (ref 6–23)
CALCIUM: 9.5 mg/dL (ref 8.4–10.5)
CO2: 26 meq/L (ref 19–32)
Chloride: 103 mEq/L (ref 96–112)
Creatinine, Ser: 1.1 mg/dL (ref 0.50–1.35)
GFR calc non Af Amer: 64 mL/min — ABNORMAL LOW (ref >90)
GFR, EST AFRICAN AMERICAN: 74 mL/min — AB (ref >90)
GLUCOSE: 125 mg/dL — AB (ref 70–99)
POTASSIUM: 4 meq/L (ref 3.7–5.3)
Sodium: 141 mEq/L (ref 137–147)

## 2013-08-02 MED ORDER — ONDANSETRON 4 MG PO TBDP: 4.0000 mg | ORAL_TABLET | Freq: Three times a day (TID) | ORAL | Status: DC | PRN

## 2013-08-02 MED ORDER — HYDROCODONE-ACETAMINOPHEN 5-325 MG PO TABS: 1.0000 | ORAL_TABLET | Freq: Four times a day (QID) | ORAL | Status: DC | PRN

## 2013-08-02 NOTE — ED Provider Notes (Signed)
CSN: [id number]     Arrival date & time 08/02/13  1102 History   First MD Initiated Contact with Patient 08/02/13 1123     Chief Complaint  Patient presents with  . Flank Pain     (Consider location/radiation/quality/duration/timing/severity/associated sxs/prior Treatment) Patient is a 73 y.o. male presenting with flank pain. The history is provided by the patient.  Flank Pain Associated symptoms include abdominal pain. Pertinent negatives include no chest pain, no headaches and no shortness of breath.   patient with onset of a right-sided flank pain CVA pain started about 3 in the morning. Would resolve and then come back. Patient currently pain free. Patient's had a history of kidney stones in the past has a urologist in [place] area. Not associated with any nausea or vomiting. No fevers. No blood in the urine.  Past Medical History  Diagnosis Date  . Baker's cyst of knee     left  . Moderate persistent asthma   . Cancer of appendix   . Renal disorder    Past Surgical History  Procedure Laterality Date  . Appendectomy    . Cartilage surgery in nose     Family History  Problem Relation Age of Onset  . Asthma Son     as a child  . Heart disease Mother   . Prostate cancer Father    History  Substance Use Topics  . Smoking status: Former Smoker -- 1.00 packs/day for 20 years    Types: Cigarettes    Quit date: 03/20/1978  . Smokeless tobacco: Never Used  . Alcohol Use: 0.6 oz/week    1 Glasses of wine per week     Comment: a glass of wine nightly    Review of Systems  Constitutional: Negative for fever.  HENT: Negative for congestion.   Eyes: Negative for visual disturbance.  Respiratory: Negative for shortness of breath.   Cardiovascular: Negative for chest pain.  Gastrointestinal: Positive for abdominal pain. Negative for nausea and vomiting.  Genitourinary: Positive for dysuria and flank pain.  Musculoskeletal: Positive for back pain.  Skin: Positive for  rash.  Neurological: Negative for headaches.  Hematological: Does not bruise/bleed easily.  Psychiatric/Behavioral: Negative for confusion.      Allergies  Review of patient's allergies indicates no known allergies.  Home Medications   Prior to Admission medications   Medication Sig Start Date End Date Taking? Authorizing Provider  albuterol (PROVENTIL HFA;VENTOLIN HFA) 108 (90 BASE) MCG/ACT inhaler Inhale 2 puffs into the lungs every 6 (six) hours as needed for wheezing or shortness of breath. 04/18/12  Yes Elsie Stain, MD  vardenafil (LEVITRA) 10 MG tablet Take 10 mg by mouth daily as needed for erectile dysfunction.   Yes Historical Provider, MD  Aromatic Inhalants (VICKS VAPOR INHALER IN) Inhale into the lungs at bedtime.    Historical Provider, MD  clobetasol cream (TEMOVATE) 0.05 % Apply topically. As directed    Historical Provider, MD  Doxylamine Succinate, Sleep, (SLEEP AID PO) Take 2 tablets by mouth at bedtime.    Historical Provider, MD  hydrochlorothiazide (HYDRODIURIL) 25 MG tablet Take 1 tablet (25 mg total) by mouth daily. 04/30/13   April Alfonso Patten, MD  HYDROcodone-acetaminophen (NORCO/VICODIN) 5-325 MG per tablet Take 1-2 tablets by mouth every 6 (six) hours as needed for moderate pain. 08/02/13   Mervin Kung, MD  mometasone (ASMANEX 120 METERED DOSES) 220 MCG/INH inhaler Inhale 2 puffs into the lungs daily. 04/18/12   Elsie Stain, MD  omeprazole (PRILOSEC) 20 MG capsule Take 1 capsule (20 mg total) by mouth daily. 04/18/12   Elsie Stain, MD  ondansetron (ZOFRAN ODT) 4 MG disintegrating tablet Take 1 tablet (4 mg total) by mouth every 8 (eight) hours as needed for nausea or vomiting. 08/02/13   Mervin Kung, MD  oxyCODONE-acetaminophen (PERCOCET) 5-325 MG per tablet Take 1 tablet by mouth every 6 (six) hours as needed. 04/30/13   April K Palumbo-Rasch, MD  TRAVATAN Z 0.004 % SOLN ophthalmic solution Place 1 drop into both eyes 2 (two) times  daily.    Historical Provider, MD   BP 145/86  Pulse 64  Temp(Src) 97.4 F (36.3 C) (Oral)  Resp 18  Ht 5\' 9"  (1.753 m)  Wt 190 lb (86.183 kg)  BMI 28.05 kg/m2  SpO2 94% Physical Exam  Nursing note and vitals reviewed. Constitutional: He is oriented to person, place, and time. He appears well-developed and well-nourished. No distress.  HENT:  Head: Normocephalic and atraumatic.  Mouth/Throat: Oropharynx is clear and moist.  Eyes: Conjunctivae and EOM are normal. Pupils are equal, round, and reactive to light.  Neck: Normal range of motion.  Cardiovascular: Normal rate, regular rhythm and normal heart sounds.   Pulmonary/Chest: Effort normal and breath sounds normal. No respiratory distress.  Abdominal: Soft. Bowel sounds are normal. There is no tenderness.  Musculoskeletal: Normal range of motion. He exhibits no edema.  Neurological: He is alert and oriented to person, place, and time. No cranial nerve deficit. He exhibits normal muscle tone. Coordination normal.  Skin: Skin is warm. No erythema.    ED Course  Procedures (including critical care time) Labs Review Labs Reviewed  URINALYSIS, ROUTINE W REFLEX MICROSCOPIC - Abnormal; Notable for the following:    Hgb urine dipstick MODERATE (*)    All other components within normal limits  CBC WITH DIFFERENTIAL - Abnormal; Notable for the following:    Monocytes Relative 17 (*)    All other components within normal limits  BASIC METABOLIC PANEL - Abnormal; Notable for the following:    Glucose, Bld 125 (*)    GFR calc non Af Amer 64 (*)    GFR calc Af Amer 74 (*)    All other components within normal limits  URINE MICROSCOPIC-ADD ON - Abnormal; Notable for the following:    Bacteria, UA MANY (*)    Casts HYALINE CASTS (*)    All other components within normal limits   Results for orders placed during the hospital encounter of 08/02/13  URINALYSIS, ROUTINE W REFLEX MICROSCOPIC      Result Value Ref Range   Color, Urine  YELLOW  YELLOW   APPearance CLEAR  CLEAR   Specific Gravity, Urine 1.021  1.005 - 1.030   pH 5.0  5.0 - 8.0   Glucose, UA NEGATIVE  NEGATIVE mg/dL   Hgb urine dipstick MODERATE (*) NEGATIVE   Bilirubin Urine NEGATIVE  NEGATIVE   Ketones, ur NEGATIVE  NEGATIVE mg/dL   Protein, ur NEGATIVE  NEGATIVE mg/dL   Urobilinogen, UA 0.2  0.0 - 1.0 mg/dL   Nitrite NEGATIVE  NEGATIVE   Leukocytes, UA NEGATIVE  NEGATIVE  CBC WITH DIFFERENTIAL      Result Value Ref Range   WBC 4.8  4.0 - 10.5 K/uL   RBC 4.50  4.22 - 5.81 MIL/uL   Hemoglobin 14.6  13.0 - 17.0 g/dL   HCT 40.6  39.0 - 52.0 %   MCV 90.2  78.0 - 100.0 fL  MCH 32.4  26.0 - 34.0 pg   MCHC 36.0  30.0 - 36.0 g/dL   RDW 13.5  11.5 - 15.5 %   Platelets 190  150 - 400 K/uL   Neutrophils Relative % 55  43 - 77 %   Neutro Abs 2.6  1.7 - 7.7 K/uL   Lymphocytes Relative 23  12 - 46 %   Lymphs Abs 1.1  0.7 - 4.0 K/uL   Monocytes Relative 17 (*) 3 - 12 %   Monocytes Absolute 0.8  0.1 - 1.0 K/uL   Eosinophils Relative 5  0 - 5 %   Eosinophils Absolute 0.3  0.0 - 0.7 K/uL   Basophils Relative 1  0 - 1 %   Basophils Absolute 0.0  0.0 - 0.1 K/uL  BASIC METABOLIC PANEL      Result Value Ref Range   Sodium 141  137 - 147 mEq/L   Potassium 4.0  3.7 - 5.3 mEq/L   Chloride 103  96 - 112 mEq/L   CO2 26  19 - 32 mEq/L   Glucose, Bld 125 (*) 70 - 99 mg/dL   BUN 18  6 - 23 mg/dL   Creatinine, Ser 1.10  0.50 - 1.35 mg/dL   Calcium 9.5  8.4 - 10.5 mg/dL   GFR calc non Af Amer 64 (*) >90 mL/min   GFR calc Af Amer 74 (*) >90 mL/min  URINE MICROSCOPIC-ADD ON      Result Value Ref Range   Squamous Epithelial / LPF RARE  RARE   WBC, UA 0-2  <3 WBC/hpf   RBC / HPF 11-20  <3 RBC/hpf   Bacteria, UA MANY (*) RARE   Casts HYALINE CASTS (*) NEGATIVE   Urine-Other MUCOUS PRESENT       Imaging Review Ct Abdomen Pelvis Wo Contrast  08/02/2013   CLINICAL DATA:  Right flank pain for 24 hr  EXAM: CT ABDOMEN AND PELVIS WITHOUT CONTRAST  TECHNIQUE:  Multidetector CT imaging of the abdomen and pelvis was performed following the standard protocol without IV contrast.  COMPARISON:  CT CTA ABD/PEL W/CM AND/OR W/O CM dated 04/29/2013  FINDINGS: Curvilinear lingular scarring or atelectasis reidentified. Lung bases otherwise clear. No pleural effusion.  Moderate atheromatous aortic calcification with minimal infrarenal aortic ectasia measuring 2.1 cm image 36. Moderate bilateral perinephric stranding is noted. There is mild right hydroureteronephrosis to the level of the right ureteral vesicular junction. A 2 mm calculus is identified at or adjacent to the region of the right ureteral vesicular junction image 77. No left ureteral radiopaque calculus is identified. Nonobstructing 2 mm right upper pole renal calculus identified image 27.  Unenhanced gallbladder, spleen, pancreas, and adrenal glands are unremarkable. Hepatic granuloma incidentally noted in the otherwise normal-appearing liver. Large duodenal diverticulum incidentally noted image 28. No lymphadenopathy, free air, or ascites.  Clip from presumed appendectomy noted. No bowel wall thickening or focal segmental dilatation. Fat containing umbilical hernia. Colonic diverticuli noted without evidence for diverticulitis. Mild prostatomegaly, 4.8 cm. No acute osseous abnormality. Multilevel disc degenerative change noted in the lumbar spine.  IMPRESSION: 2 mm calculus at the right ureterovesicular junction versus immediately adjacent to it within the bladder, with mild right hydroureteronephrosis.   Electronically Signed   By: Conchita Paris M.D.   On: 08/02/2013 12:23     EKG Interpretation None      MDM   Final diagnoses:  Right ureteral stone   CT scan is consistent with right ureteral stone may be  in the bladder. Patient currently without symptoms. Patient has urologist. The followup with. Patient's had kidney stones in the past. Nontoxic no acute distress. Urinalysis shows some hematuria  consistent with the finding of the stone.    Mervin Kung, MD 08/02/13 [phone]

## 2013-08-02 NOTE — Discharge Instructions (Signed)
Kidney Stones Kidney stones (urolithiasis) are solid masses that form inside your kidneys. The intense pain is caused by the stone moving through the kidney, ureter, bladder, and urethra (urinary tract). When the stone moves, the ureter starts to spasm around the stone. The stone is usually passed in your pee (urine).  HOME CARE  Drink enough fluids to keep your pee clear or pale yellow. This helps to get the stone out.  Strain all pee through the provided strainer. Do not pee without peeing through the strainer, not even once. If you pee the stone out, catch it in the strainer. The stone may be as small as a grain of salt. Take this to your doctor. This will help your doctor figure out what you can do to try to prevent more kidney stones.  Only take medicine as told by your doctor.  Follow up with your doctor as told.  Get follow-up X-rays as told by your doctor. GET HELP IF: You have pain that gets worse even if you have been taking pain medicine. GET HELP RIGHT AWAY IF:   Your pain does not get better with medicine.  You have a fever or shaking chills.  Your pain increases and gets worse over 18 hours.  You have new belly (abdominal) pain.  You feel faint or pass out.  You are unable to pee. MAKE SURE YOU:   Understand these instructions.  Will watch your condition.  Will get help right away if you are not doing well or get worse. Document Released: 08/23/2007 Document Revised: 11/06/2012 Document Reviewed: 08/07/2012 [facility] Patient Information 2014 [place], [place].  Ureteral Colic Ureteral colic is spasm-like pain from the kidney or the ureter. This is often caused by a kidney stone. The pain is caused by the stone trying to get through the tubes that pass your pee. HOME CARE   Drink enough fluids to keep your pee (urine) clear or pale yellow.  Strain all your pee. A strainer will be provided. Keep anything caught in the [place] and bring it to your doctor. The  stone causing the pain may be very small.  Only take medicine as told by your doctor.  Follow up with your doctor as told. GET HELP RIGHT AWAY IF:   Pain is not controlled with medicine.  Pain continues or gets worse.  The pain changes and there is chest or belly (abdominal) pain.  You pass out (faint).  You cannot pee.  You keep throwing up (vomiting).  You have a temperature by mouth above 102 F (38.9 C), not controlled by medicine. MAKE SURE YOU:   Understand these instructions.  Will watch this condition.  Will get help right away if you are not doing well or get worse. Document Released: 08/23/2007 Document Revised: 05/29/2011 Document Reviewed: 08/23/2007 [facility] Patient Information 2014 [place], [place].  A kidney stone is either near the bladder or in the bladder. If you develop recurrent pain that wasn't quite in the bladder. Take pain medicine and antinausea medicine as needed. Make a point to follow up with your urologist at [facility]. Return for any newer worse symptoms.

## 2013-08-02 NOTE — ED Notes (Signed)
Right flank pain, started last night. States he has had a kidney stone in the past and this feels the same.

## 2014-08-30 ENCOUNTER — Emergency Department (HOSPITAL_BASED_OUTPATIENT_CLINIC_OR_DEPARTMENT_OTHER)
Admission: EM | Admit: 2014-08-30 | Discharge: 2014-08-30 | Disposition: A | Discharge status: Other Acute Inpatient Hospital | Payer: Medicare Other | Attending: Emergency Medicine | Admitting: Emergency Medicine

## 2014-08-30 ENCOUNTER — Emergency Department (HOSPITAL_BASED_OUTPATIENT_CLINIC_OR_DEPARTMENT_OTHER): Payer: Medicare Other

## 2014-08-30 ENCOUNTER — Encounter (HOSPITAL_BASED_OUTPATIENT_CLINIC_OR_DEPARTMENT_OTHER): Payer: Self-pay | Admitting: Emergency Medicine

## 2014-08-30 DIAGNOSIS — J4540 Moderate persistent asthma, uncomplicated: Secondary | ICD-10-CM | POA: Diagnosis not present

## 2014-08-30 DIAGNOSIS — I10 Essential (primary) hypertension: Secondary | ICD-10-CM | POA: Insufficient documentation

## 2014-08-30 DIAGNOSIS — Z87442 Personal history of urinary calculi: Secondary | ICD-10-CM | POA: Insufficient documentation

## 2014-08-30 DIAGNOSIS — Z8739 Personal history of other diseases of the musculoskeletal system and connective tissue: Secondary | ICD-10-CM | POA: Diagnosis not present

## 2014-08-30 DIAGNOSIS — Z7951 Long term (current) use of inhaled steroids: Secondary | ICD-10-CM | POA: Diagnosis not present

## 2014-08-30 DIAGNOSIS — R079 Chest pain, unspecified: Secondary | ICD-10-CM | POA: Diagnosis not present

## 2014-08-30 DIAGNOSIS — Z87891 Personal history of nicotine dependence: Secondary | ICD-10-CM | POA: Insufficient documentation

## 2014-08-30 DIAGNOSIS — Z79899 Other long term (current) drug therapy: Secondary | ICD-10-CM | POA: Diagnosis not present

## 2014-08-30 DIAGNOSIS — Z8509 Personal history of malignant neoplasm of other digestive organs: Secondary | ICD-10-CM | POA: Insufficient documentation

## 2014-08-30 HISTORY — DX: Calculus of ureter: N20.1

## 2014-08-30 LAB — MAGNESIUM: MAGNESIUM: 2.3 mg/dL (ref 1.7–2.4)

## 2014-08-30 LAB — CBC
HEMATOCRIT: 40.2 % (ref 39.0–52.0)
Hemoglobin: 14 g/dL (ref 13.0–17.0)
MCH: 31.5 pg (ref 26.0–34.0)
MCHC: 34.8 g/dL (ref 30.0–36.0)
MCV: 90.3 fL (ref 78.0–100.0)
Platelets: 205 10*3/uL (ref 150–400)
RBC: 4.45 MIL/uL (ref 4.22–5.81)
RDW: 13.6 % (ref 11.5–15.5)
WBC: 5.3 10*3/uL (ref 4.0–10.5)

## 2014-08-30 LAB — COMPREHENSIVE METABOLIC PANEL
ALT: 17 U/L (ref 17–63)
ANION GAP: 7 (ref 5–15)
AST: 23 U/L (ref 15–41)
Albumin: 4.4 g/dL (ref 3.5–5.0)
Alkaline Phosphatase: 64 U/L (ref 38–126)
BUN: 19 mg/dL (ref 6–20)
CALCIUM: 9.4 mg/dL (ref 8.9–10.3)
CO2: 24 mmol/L (ref 22–32)
Chloride: 106 mmol/L (ref 101–111)
Creatinine, Ser: 1.13 mg/dL (ref 0.61–1.24)
Glucose, Bld: 119 mg/dL — ABNORMAL HIGH (ref 65–99)
Potassium: 3.9 mmol/L (ref 3.5–5.1)
Sodium: 137 mmol/L (ref 135–145)
TOTAL PROTEIN: 7.4 g/dL (ref 6.5–8.1)
Total Bilirubin: 0.3 mg/dL (ref 0.3–1.2)

## 2014-08-30 LAB — TROPONIN I: Troponin I: <0.03 ng/mL (ref <0.031)

## 2014-08-30 MED ORDER — MORPHINE SULFATE 2 MG/ML IJ SOLN
2.0000 mg | Freq: Once | INTRAMUSCULAR | Status: AC
  Administered 2014-08-30: 2 mg via INTRAVENOUS
  Filled 2014-08-30: qty 1

## 2014-08-30 MED ORDER — SODIUM CHLORIDE 0.9 % IV BOLUS (SEPSIS)
500.0000 mL | Freq: Once | INTRAVENOUS | Status: AC
  Administered 2014-08-30: 500 mL via INTRAVENOUS

## 2014-08-30 MED ORDER — ASPIRIN 81 MG PO CHEW
CHEWABLE_TABLET | ORAL | Status: AC
  Administered 2014-08-30: 324 mg via ORAL
  Filled 2014-08-30: qty 3

## 2014-08-30 MED ORDER — ASPIRIN 81 MG PO CHEW
324.0000 mg | CHEWABLE_TABLET | Freq: Once | ORAL | Status: DC
  Administered 2014-08-30: 324 mg via ORAL

## 2014-08-30 MED ORDER — NITROGLYCERIN 0.4 MG SL SUBL
0.4000 mg | SUBLINGUAL_TABLET | SUBLINGUAL | Status: AC | PRN
  Administered 2014-08-30 (×3): 0.4 mg via SUBLINGUAL

## 2014-08-30 MED ORDER — ASPIRIN 81 MG PO CHEW: 324.0000 mg | CHEWABLE_TABLET | Freq: Once | ORAL | Status: AC

## 2014-08-30 NOTE — ED Notes (Signed)
MD at bedside. Informed of pts cont c/o mid, non radiating chest pressure, 12 lead EKG repeated

## 2014-08-30 NOTE — ED Provider Notes (Signed)
CSN: [id number]     Arrival date & time 08/30/14  1950 History   First MD Initiated Contact with Patient 08/30/14 [phone]     Chief Complaint  Patient presents with  . Chest Pain     (Consider location/radiation/quality/duration/timing/severity/associated sxs/prior Treatment) HPI  This is a 74 year old male with a history of hypertension and a remote history of smoking, having quit in the 1980s. He is here with chest pain that awakened him from sleep approximately 5:50 AM. The pain is located in the lower substernal region, is described as a pressure and is rated as a 6 out of 10. There is no associated diaphoresis, shortness of breath or nausea. There are no known modifying factors.  Past Medical History  Diagnosis Date  . Baker's cyst of knee     left  . Moderate persistent asthma   . Cancer of appendix   . Ureteral stone    Past Surgical History  Procedure Laterality Date  . Appendectomy    . Cartilage surgery in nose     Family History  Problem Relation Age of Onset  . Asthma Son     as a child  . Heart disease Mother   . Prostate cancer Father    History  Substance Use Topics  . Smoking status: Former Smoker -- 1.00 packs/day for 20 years    Types: Cigarettes    Quit date: 03/20/1978  . Smokeless tobacco: Never Used  . Alcohol Use: 0.6 oz/week    1 Glasses of wine per week     Comment: a glass of wine nightly    Review of Systems  All other systems reviewed and are negative.   Allergies  Review of patient's allergies indicates no known allergies.  Home Medications   Prior to Admission medications   Medication Sig Start Date End Date Taking? Authorizing Provider  albuterol (PROVENTIL HFA;VENTOLIN HFA) 108 (90 BASE) MCG/ACT inhaler Inhale 2 puffs into the lungs every 6 (six) hours as needed for wheezing or shortness of breath. 04/18/12   Elsie Stain, MD  Aromatic Inhalants (VICKS VAPOR INHALER IN) Inhale into the lungs at bedtime.    Historical Provider, MD   clobetasol cream (TEMOVATE) 0.05 % Apply topically. As directed    Historical Provider, MD  Doxylamine Succinate, Sleep, (SLEEP AID PO) Take 2 tablets by mouth at bedtime.    Historical Provider, MD  hydrochlorothiazide (HYDRODIURIL) 25 MG tablet Take 1 tablet (25 mg total) by mouth daily. 04/30/13   April Palumbo, MD  HYDROcodone-acetaminophen (NORCO/VICODIN) 5-325 MG per tablet Take 1-2 tablets by mouth every 6 (six) hours as needed for moderate pain. 08/02/13   Fredia Sorrow, MD  mometasone (ASMANEX 120 METERED DOSES) 220 MCG/INH inhaler Inhale 2 puffs into the lungs daily. 04/18/12   Elsie Stain, MD  omeprazole (PRILOSEC) 20 MG capsule Take 1 capsule (20 mg total) by mouth daily. 04/18/12   Elsie Stain, MD  ondansetron (ZOFRAN ODT) 4 MG disintegrating tablet Take 1 tablet (4 mg total) by mouth every 8 (eight) hours as needed for nausea or vomiting. 08/02/13   Fredia Sorrow, MD  oxyCODONE-acetaminophen (PERCOCET) 5-325 MG per tablet Take 1 tablet by mouth every 6 (six) hours as needed. 04/30/13   April Palumbo, MD  TRAVATAN Z 0.004 % SOLN ophthalmic solution Place 1 drop into both eyes 2 (two) times daily.    Historical Provider, MD  vardenafil (LEVITRA) 10 MG tablet Take 10 mg by mouth daily as needed for erectile dysfunction.  Historical Provider, MD   BP 111/61 mmHg  Pulse 64  Temp(Src) 97.7 F (36.5 C) (Oral)  Resp 16  Wt 185 lb (83.915 kg)  SpO2 93%   Physical Exam  General: Well-developed, well-nourished male in no acute distress; appearance consistent with age of record HENT: normocephalic; atraumatic Eyes: pupils equal, round and reactive to light; extraocular muscles intact Neck: supple Heart: regular rate and rhythm; no murmurs, rubs or gallops Lungs: clear to auscultation bilaterally Abdomen: soft; nondistended; nontender; no masses or hepatosplenomegaly; bowel sounds present Extremities: No deformity; full range of motion; pulses normal Neurologic: Awake, alert  and oriented; motor function intact in all extremities and symmetric; no facial droop Skin: Warm and dry Psychiatric: Normal mood and affect   ED Course  Procedures (including critical care time)   EKG Interpretation   Date/Time:  Sunday August 30 2014 06:30:27 EDT Ventricular Rate:  63 PR Interval:  194 QRS Duration: 100 QT Interval:  446 QTC Calculation: 456 R Axis:   57 Text Interpretation:  Normal sinus rhythm Possible Left atrial enlargement  Borderline ECG No significant change was found Confirmed by Florina Ou  MD,  Jenny Reichmann ([id number]) on 08/30/2014 6:34:24 AM       EKG Interpretation  Date/Time:  Sunday August 30 2014 07:08:20 EDT Ventricular Rate:  66 PR Interval:  198 QRS Duration: 104 QT Interval:  454 QTC Calculation: 475 R Axis:   70 Text Interpretation:  Normal sinus rhythm with sinus pause Normal ECG Sinus pauses not seen previously Confirmed by Porchia Sinkler  MD, Jenny Reichmann ([id number]) on 08/30/2014 7:10:00 AM        MDM  Nursing notes and vitals signs, including pulse oximetry, reviewed.  Summary of this visit's results, reviewed by myself:  Labs:  Results for orders placed or performed during the hospital encounter of 08/30/14 (from the past 24 hour(s))  Troponin I     Status: None   Collection Time: 08/30/14  6:29 AM  Result Value Ref Range   Troponin I <0.03 <0.031 ng/mL  Magnesium     Status: None   Collection Time: 08/30/14  6:29 AM  Result Value Ref Range   Magnesium 2.3 1.7 - 2.4 mg/dL    Imaging Studies: No results found.  7:08 AM Pain not relieved by sublingual nitroglycerin 3. Repeat EKG shows normal sinus rhythm with sinus arrhythmia. Dr. Leonides Schanz to follow up on studies and make disposition.     Shanon Rosser, MD 08/30/14 [phone]

## 2014-08-30 NOTE — ED Notes (Signed)
Family at bedside. 

## 2014-08-30 NOTE — ED Notes (Signed)
Patient remains on cardiac monitor with sinus bradycardia.  Patient continues to deny pain and states he would rather go home, but will go to [facility] when a bed is available.

## 2014-08-30 NOTE — ED Notes (Signed)
Pt reports awoke from sleep at 05:45 with central chest pain pressure denies Hx of cardiac other than HTN

## 2014-08-30 NOTE — ED Notes (Signed)
Resting quietly on stretcher, cont on cardiac monitor, SB noted w/o ectopy, denies any pain or discomfort at this time. Cont to await for room assignment at [facility]

## 2014-08-30 NOTE — ED Notes (Signed)
Report given to HP1

## 2014-08-30 NOTE — ED Notes (Signed)
[facility]1 here to transport patient to [facility].

## 2014-08-30 NOTE — ED Provider Notes (Addendum)
7:15 AM  Assumed care from Dr. Florina Ou.  Pt is a 74 y.o. M with h/o HTN and prior tobacco use (quit in the 1980s) who presents to ED with chest pressure that woke him from sleep.  No associated symptoms.  EKG shows no ischemic changes.  First troponin negative.  Labs, CXR pending.  Will need admission for chest pain rule out.  7:50 AM  Pt chest pain-free after 4 mg of IV morphine. Labs unremarkable. Chest x-ray clear but does show torturous aorta. He is hemodynamically stable. Have recommended admission. He thinks his primary care physician is with [facility]. He would like admission at [facility]. Will discuss with hospitalist on call.  8:50 AM  D/w Dr. Laveda Norman for admission to [facility].       [place], DO 08/30/14 0851   1:00 PM  Pt still chest pain-free and hemodynamically stable. He has had mild bradycardia here. Second troponin is negative. Bed available at [facility]. Awaiting transport. Patient and wife updated.  Tyrrell, DO 08/30/14 1303

## 2014-08-30 NOTE — ED Notes (Signed)
MD at bedside. 

## 2015-03-28 ENCOUNTER — Emergency Department (HOSPITAL_BASED_OUTPATIENT_CLINIC_OR_DEPARTMENT_OTHER): Payer: Medicare Other

## 2015-03-28 ENCOUNTER — Other Ambulatory Visit: Payer: Self-pay

## 2015-03-28 ENCOUNTER — Emergency Department (HOSPITAL_BASED_OUTPATIENT_CLINIC_OR_DEPARTMENT_OTHER)
Admission: EM | Admit: 2015-03-28 | Discharge: 2015-03-29 | Disposition: A | Discharge status: Home / Self Care | Payer: Medicare Other | Attending: Emergency Medicine | Admitting: Emergency Medicine

## 2015-03-28 ENCOUNTER — Encounter (HOSPITAL_BASED_OUTPATIENT_CLINIC_OR_DEPARTMENT_OTHER): Payer: Self-pay | Admitting: Emergency Medicine

## 2015-03-28 DIAGNOSIS — Z87891 Personal history of nicotine dependence: Secondary | ICD-10-CM | POA: Diagnosis not present

## 2015-03-28 DIAGNOSIS — R1110 Vomiting, unspecified: Secondary | ICD-10-CM | POA: Diagnosis not present

## 2015-03-28 DIAGNOSIS — I7103 Dissection of thoracoabdominal aorta: Secondary | ICD-10-CM

## 2015-03-28 DIAGNOSIS — R1013 Epigastric pain: Secondary | ICD-10-CM | POA: Insufficient documentation

## 2015-03-28 DIAGNOSIS — Z8739 Personal history of other diseases of the musculoskeletal system and connective tissue: Secondary | ICD-10-CM | POA: Insufficient documentation

## 2015-03-28 DIAGNOSIS — J4540 Moderate persistent asthma, uncomplicated: Secondary | ICD-10-CM | POA: Insufficient documentation

## 2015-03-28 DIAGNOSIS — Z9089 Acquired absence of other organs: Secondary | ICD-10-CM | POA: Diagnosis not present

## 2015-03-28 DIAGNOSIS — R079 Chest pain, unspecified: Secondary | ICD-10-CM | POA: Diagnosis not present

## 2015-03-28 DIAGNOSIS — Z85028 Personal history of other malignant neoplasm of stomach: Secondary | ICD-10-CM | POA: Insufficient documentation

## 2015-03-28 DIAGNOSIS — Z79899 Other long term (current) drug therapy: Secondary | ICD-10-CM | POA: Diagnosis not present

## 2015-03-28 DIAGNOSIS — Z87448 Personal history of other diseases of urinary system: Secondary | ICD-10-CM | POA: Diagnosis not present

## 2015-03-28 LAB — CBC WITH DIFFERENTIAL/PLATELET
BASOS PCT: 0 %
Basophils Absolute: 0 10*3/uL (ref 0.0–0.1)
Eosinophils Absolute: 0.2 10*3/uL (ref 0.0–0.7)
Eosinophils Relative: 3 %
HEMATOCRIT: 37.9 % — AB (ref 39.0–52.0)
Hemoglobin: 12.9 g/dL — ABNORMAL LOW (ref 13.0–17.0)
LYMPHS ABS: 1.4 10*3/uL (ref 0.7–4.0)
Lymphocytes Relative: 16 %
MCH: 30.8 pg (ref 26.0–34.0)
MCHC: 34 g/dL (ref 30.0–36.0)
MCV: 90.5 fL (ref 78.0–100.0)
MONOS PCT: 12 %
Monocytes Absolute: 1 10*3/uL (ref 0.1–1.0)
NEUTROS ABS: 6.1 10*3/uL (ref 1.7–7.7)
NEUTROS PCT: 69 %
Platelets: 181 10*3/uL (ref 150–400)
RBC: 4.19 MIL/uL — ABNORMAL LOW (ref 4.22–5.81)
RDW: 13.7 % (ref 11.5–15.5)
WBC: 8.7 10*3/uL (ref 4.0–10.5)

## 2015-03-28 LAB — COMPREHENSIVE METABOLIC PANEL
ALBUMIN: 4.1 g/dL (ref 3.5–5.0)
ALT: 15 U/L — ABNORMAL LOW (ref 17–63)
ANION GAP: 7 (ref 5–15)
AST: 21 U/L (ref 15–41)
Alkaline Phosphatase: 58 U/L (ref 38–126)
BUN: 20 mg/dL (ref 6–20)
CALCIUM: 9 mg/dL (ref 8.9–10.3)
CO2: 28 mmol/L (ref 22–32)
Chloride: 103 mmol/L (ref 101–111)
Creatinine, Ser: 1.09 mg/dL (ref 0.61–1.24)
GFR calc Af Amer: >60 mL/min (ref >60)
GFR calc non Af Amer: >60 mL/min (ref >60)
Glucose, Bld: 188 mg/dL — ABNORMAL HIGH (ref 65–99)
Potassium: 4.2 mmol/L (ref 3.5–5.1)
Sodium: 138 mmol/L (ref 135–145)
Total Bilirubin: 0.5 mg/dL (ref 0.3–1.2)
Total Protein: 6.9 g/dL (ref 6.5–8.1)

## 2015-03-28 LAB — I-STAT CG4 LACTIC ACID, ED: Lactic Acid, Venous: 3.11 mmol/L (ref 0.5–2.0)

## 2015-03-28 LAB — LIPASE, BLOOD: LIPASE: 44 U/L (ref 11–51)

## 2015-03-28 MED ORDER — LIDOCAINE VISCOUS 2 % MT SOLN
15.0000 mL | Freq: Once | OROMUCOSAL | Status: AC
  Administered 2015-03-28: 15 mL via OROMUCOSAL
  Filled 2015-03-28: qty 15

## 2015-03-28 MED ORDER — MORPHINE SULFATE (PF) 4 MG/ML IV SOLN: 4.0000 mg | Freq: Once | INTRAVENOUS | Status: DC

## 2015-03-28 MED ORDER — SODIUM CHLORIDE 0.9 % IV SOLN
1000.0000 mL | Freq: Once | INTRAVENOUS | Status: AC
  Administered 2015-03-28: 1000 mL via INTRAVENOUS

## 2015-03-28 MED ORDER — NITROGLYCERIN 0.4 MG SL SUBL
0.4000 mg | SUBLINGUAL_TABLET | SUBLINGUAL | Status: DC | PRN
  Administered 2015-03-28 – 2015-03-29 (×2): 0.4 mg via SUBLINGUAL
  Filled 2015-03-28: qty 1

## 2015-03-28 MED ORDER — SODIUM CHLORIDE 0.9 % IV BOLUS (SEPSIS)
1000.0000 mL | Freq: Once | INTRAVENOUS | Status: AC
  Administered 2015-03-28: 1000 mL via INTRAVENOUS

## 2015-03-28 MED ORDER — MORPHINE SULFATE (PF) 4 MG/ML IV SOLN
8.0000 mg | Freq: Once | INTRAVENOUS | Status: AC
  Administered 2015-03-28: 8 mg via INTRAVENOUS
  Filled 2015-03-28: qty 2

## 2015-03-28 MED ORDER — ALUM & MAG HYDROXIDE-SIMETH 200-200-20 MG/5ML PO SUSP
15.0000 mL | Freq: Once | ORAL | Status: AC
  Administered 2015-03-28: 15 mL via ORAL
  Filled 2015-03-28: qty 30

## 2015-03-28 MED ORDER — ASPIRIN 81 MG PO CHEW
324.0000 mg | CHEWABLE_TABLET | Freq: Once | ORAL | Status: AC
  Administered 2015-03-28: 324 mg via ORAL
  Filled 2015-03-28: qty 4

## 2015-03-28 MED ORDER — ONDANSETRON HCL 4 MG/2ML IJ SOLN
4.0000 mg | Freq: Once | INTRAMUSCULAR | Status: AC
  Administered 2015-03-28: 4 mg via INTRAVENOUS
  Filled 2015-03-28: qty 2

## 2015-03-28 MED ORDER — IOHEXOL 350 MG/ML SOLN
100.0000 mL | Freq: Once | INTRAVENOUS | Status: AC | PRN
  Administered 2015-03-28: 100 mL via INTRAVENOUS

## 2015-03-28 MED ORDER — LABETALOL HCL 5 MG/ML IV SOLN
5.0000 mg | Freq: Once | INTRAVENOUS | Status: DC
  Filled 2015-03-28: qty 4

## 2015-03-28 NOTE — ED Notes (Signed)
Pt on heart monitor 

## 2015-03-28 NOTE — ED Notes (Addendum)
No changes, calm, NAD, interactive, VSS.

## 2015-03-28 NOTE — ED Notes (Signed)
labetolol held d/t VS change, EDP aware, pt to CT, alert, NAD, calm, interactive, resps e/u, speaking in clear complete sentences.

## 2015-03-28 NOTE — ED Notes (Signed)
Here tonight for abd pain, radiated to back and chest, denies shoulder or arm pain, also sob, nv (Vx1), (denies: fever, dizziness, syncope, bleeding, diarrhea, visual changes), MAEx4, pedal pulses equal and strong, LS CTA, abd soft NT, VSS.

## 2015-03-28 NOTE — ED Notes (Addendum)
Family leaving BS, no changes, 2nd liter infusing, denies questions or needs, updated, pending CT results.

## 2015-03-28 NOTE — ED Notes (Signed)
Pt in c/o onset of upper abd pain and emesis x 1 hour. Noted emesis in waiting room bathroom. Denies cardiac or gastric hx.

## 2015-03-28 NOTE — ED Provider Notes (Signed)
CSN: [id number]     Arrival date & time 03/28/15  2108 History   First MD Initiated Contact with Patient 03/28/15 2113     Chief Complaint  Patient presents with  . Abdominal Pain  . Emesis     (Consider location/radiation/quality/duration/timing/severity/associated sxs/prior Treatment) Patient is a 75 y.o. male presenting with abdominal pain, vomiting, and chest pain. The history is provided by the patient.  Abdominal Pain Pain location:  Epigastric Associated symptoms: chest pain   Associated symptoms: no chills, no diarrhea, no fever, no shortness of breath and no vomiting   Emesis Associated symptoms: abdominal pain   Associated symptoms: no arthralgias, no chills, no diarrhea, no headaches and no myalgias   Chest Pain Pain location:  Substernal area Pain quality: crushing   Pain radiates to:  Upper back Pain radiates to the back: yes   Pain severity:  Severe Onset quality:  Sudden Duration:  2 hours Timing:  Constant Progression:  Unchanged Chronicity:  New Relieved by:  Nothing Worsened by:  Nothing tried Ineffective treatments:  None tried Associated symptoms: abdominal pain   Associated symptoms: no fever, no headache, no palpitations, no shortness of breath and not vomiting   Risk factors: male sex and smoking   Risk factors: no coronary artery disease, no diabetes mellitus and no Marfan's syndrome     75 year old male with a chief complaint of sudden onset chest pain. He describes his pain is terrible and radiated into his upper back. The pain is moved since then and is now in his upper to mid abdomen. Patient states that nothing seems to make it better, certain positions have made it worse. Patient vomited a couple times. Denies fever chills. Denies cough or congestion.  Past Medical History  Diagnosis Date  . Baker's cyst of knee     left  . Moderate persistent asthma   . Cancer of appendix ([place])   . Ureteral stone    Past Surgical History  Procedure  Laterality Date  . Appendectomy    . Cartilage surgery in nose     Family History  Problem Relation Age of Onset  . Asthma Son     as a child  . Heart disease Mother   . Prostate cancer Father    Social History  Substance Use Topics  . Smoking status: Former Smoker -- 1.00 packs/day for 20 years    Types: Cigarettes    Quit date: 03/20/1978  . Smokeless tobacco: Never Used  . Alcohol Use: 0.6 oz/week    1 Glasses of wine per week     Comment: a glass of wine nightly    Review of Systems  Constitutional: Negative for fever and chills.  HENT: Negative for congestion and facial swelling.   Eyes: Negative for discharge and visual disturbance.  Respiratory: Negative for shortness of breath.   Cardiovascular: Positive for chest pain. Negative for palpitations.  Gastrointestinal: Positive for abdominal pain. Negative for vomiting and diarrhea.  Musculoskeletal: Negative for myalgias and arthralgias.  Skin: Negative for color change and rash.  Neurological: Negative for tremors, syncope and headaches.  Psychiatric/Behavioral: Negative for confusion and dysphoric mood.      Allergies  Review of patient's allergies indicates no known allergies.  Home Medications   Prior to Admission medications   Medication Sig Start Date End Date Taking? Authorizing Provider  albuterol (PROVENTIL HFA;VENTOLIN HFA) 108 (90 BASE) MCG/ACT inhaler Inhale 2 puffs into the lungs every 6 (six) hours as needed for wheezing or shortness  of breath. 04/18/12   Elsie Stain, MD  Aromatic Inhalants (VICKS VAPOR INHALER IN) Inhale into the lungs at bedtime.    Historical Provider, MD  clobetasol cream (TEMOVATE) 0.05 % Apply topically. As directed    Historical Provider, MD  Doxylamine Succinate, Sleep, (SLEEP AID PO) Take 2 tablets by mouth at bedtime.    Historical Provider, MD  hydrochlorothiazide (HYDRODIURIL) 25 MG tablet Take 1 tablet (25 mg total) by mouth daily. 04/30/13   April Palumbo, MD    HYDROcodone-acetaminophen (NORCO/VICODIN) 5-325 MG per tablet Take 1-2 tablets by mouth every 6 (six) hours as needed for moderate pain. 08/02/13   Fredia Sorrow, MD  mometasone (ASMANEX 120 METERED DOSES) 220 MCG/INH inhaler Inhale 2 puffs into the lungs daily. 04/18/12   Elsie Stain, MD  omeprazole (PRILOSEC) 20 MG capsule Take 1 capsule (20 mg total) by mouth daily. 04/18/12   Elsie Stain, MD  ondansetron (ZOFRAN ODT) 4 MG disintegrating tablet Take 1 tablet (4 mg total) by mouth every 8 (eight) hours as needed for nausea or vomiting. 08/02/13   Fredia Sorrow, MD  oxyCODONE-acetaminophen (PERCOCET) 5-325 MG per tablet Take 1 tablet by mouth every 6 (six) hours as needed. 04/30/13   April Palumbo, MD  TRAVATAN Z 0.004 % SOLN ophthalmic solution Place 1 drop into both eyes 2 (two) times daily.    Historical Provider, MD  vardenafil (LEVITRA) 10 MG tablet Take 10 mg by mouth daily as needed for erectile dysfunction.    Historical Provider, MD   BP 120/63 mmHg  Pulse 55  Temp(Src) 97.3 F (36.3 C) (Oral)  Resp 12  Ht 5\' 9"  (1.753 m)  Wt 190 lb (86.183 kg)  BMI 28.05 kg/m2  SpO2 93% Physical Exam  Constitutional: He is oriented to person, place, and time. He appears well-developed and well-nourished.  HENT:  Head: Normocephalic and atraumatic.  Eyes: EOM are normal. Pupils are equal, round, and reactive to light.  Neck: Normal range of motion. Neck supple. No JVD present.  Cardiovascular: Normal rate and regular rhythm.  Exam reveals no gallop and no friction rub.   No murmur heard. Pulmonary/Chest: No respiratory distress. He has no wheezes.  Abdominal: He exhibits no distension. There is no tenderness. There is no rebound and no guarding.    Musculoskeletal: Normal range of motion.  Neurological: He is alert and oriented to person, place, and time.  Skin: No rash noted. No pallor.  Psychiatric: He has a normal mood and affect. His behavior is normal.  Nursing note and  vitals reviewed.   ED Course  Procedures (including critical care time) Labs Review Labs Reviewed  CBC WITH DIFFERENTIAL/PLATELET - Abnormal; Notable for the following:    RBC 4.19 (*)    Hemoglobin 12.9 (*)    HCT 37.9 (*)    All other components within normal limits  COMPREHENSIVE METABOLIC PANEL - Abnormal; Notable for the following:    Glucose, Bld 188 (*)    ALT 15 (*)    All other components within normal limits  URINALYSIS, ROUTINE W REFLEX MICROSCOPIC (NOT AT [facility]) - Abnormal; Notable for the following:    Glucose, UA 100 (*)    All other components within normal limits  I-STAT CG4 LACTIC ACID, ED - Abnormal; Notable for the following:    Lactic Acid, Venous 3.11 (*)    All other components within normal limits  LIPASE, BLOOD  TROPONIN I  I-STAT CG4 LACTIC ACID, ED  I-STAT CG4 LACTIC ACID,  ED  I-STAT CG4 LACTIC ACID, ED    Imaging Review Ct Angio Chest Aorta W/cm &/or Wo/cm  03/28/2015  CLINICAL DATA:  Sudden onset of chest pain radiating to the back, concern for dissection. Epigastric pain with emesis. Shortness of breath. EXAM: CT ANGIOGRAPHY CHEST, ABDOMEN AND PELVIS TECHNIQUE: Multidetector CT imaging through the chest, abdomen and pelvis was performed using the standard protocol during bolus administration of intravenous contrast. Multiplanar reconstructed images and MIPs were obtained and reviewed to evaluate the vascular anatomy. CONTRAST:  167mL OMNIPAQUE IOHEXOL 350 MG/ML SOLN COMPARISON:  CTA chest abdomen pelvis 08/31/2014 FINDINGS: CTA CHEST FINDINGS Normal caliber thoracic aorta without dissection or hematoma. Moderate calcified and noncalcified atheromatous plaque. No aneurysm. Conventional branching pattern from the aortic arch. No filling defects in the central pulmonary arteries to suggest pulmonary embolus. The heart is normal in size. Coronary artery calcifications are seen. No pleural or pericardial effusion. No mediastinal or hilar adenopathy. Lower lobe  opacities consistent with atelectasis. No pulmonary mass or nodule. No pulmonary edema. There are no acute or suspicious osseous abnormalities. Review of the MIP images confirms the above findings. CTA ABDOMEN AND PELVIS FINDINGS Atherosclerosis of the abdominal aorta without dissection. Flaring of the infrarenal portion measuring 2.4 cm maximal dimension without aneurysm. Atherosclerosis involves the origin of the a celiac, superior mesenteric, and inferior mesenteric artery which remain patent without abrupt occlusion. Single bilateral renal arteries are patent, atherosclerosis involving the origin on the right. Normal caliber inguinal arteries bilaterally. Arterial phase imaging of the liver, gallbladder, spleen, adrenal glands, and pancreas are unremarkable. Scattered granuloma in the liver. Stomach is physiologically distended with ingested contents. 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 No dilated or thickened small bowel loops. There is diverticulosis throughout the colon without evidence of diverticulitis. Appendix not seen. No free air, free fluid or intra-abdominal fluid collection. Symmetric renal enhancement. No hydronephrosis. Heterogeneous lesion arising from the upper right kidney with questionable enhancement on arterial phase imaging, unchanged in size from prior CT. Additional hypodense lesion in the anterior interpolar right kidney is likely a simple cyst. Within the pelvis gallbladder is physiologically distended. Prominence of prostate gland, similar to prior. No pelvic free fluid. There is no retroperitoneal adenopathy. Review of the MIP images confirms the above findings. IMPRESSION: 1. 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 No acute aortic syndrome. 2. No definite acute abnormality in the chest abdomen or pelvis. 3. Heterogeneous 1.4 cm lesion arising from the upper right kidney with questionable arterial enhancement.  This is unchanged in size from prior exam. Recommend further characterization with nonemergent renal protocol MRI. 4. 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 Electronically Signed   By: Jeb Levering M.D.   On: 03/28/2015 23:12   Ct Angio Abd/pel W/ And/or W/o  03/28/2015  CLINICAL DATA:  Sudden onset of chest pain radiating to the back, concern for dissection. Epigastric pain with emesis. Shortness of breath. EXAM: CT ANGIOGRAPHY CHEST, ABDOMEN AND PELVIS TECHNIQUE: Multidetector CT imaging through the chest, abdomen and pelvis was performed using the standard protocol during bolus administration of intravenous contrast. Multiplanar reconstructed images and MIPs were obtained and reviewed to evaluate the vascular anatomy. CONTRAST:  144mL OMNIPAQUE IOHEXOL 350 MG/ML SOLN COMPARISON:  CTA chest abdomen pelvis 08/31/2014 FINDINGS: CTA CHEST FINDINGS Normal caliber thoracic aorta without dissection or hematoma. Moderate calcified and noncalcified atheromatous plaque. No aneurysm. Conventional branching pattern from the aortic arch. No filling defects in the central pulmonary  arteries to suggest pulmonary embolus. The heart is normal in size. Coronary artery calcifications are seen. No pleural or pericardial effusion. No mediastinal or hilar adenopathy. Lower lobe opacities consistent with atelectasis. No pulmonary mass or nodule. No pulmonary edema. There are no acute or suspicious osseous abnormalities. Review of the MIP images confirms the above findings. CTA ABDOMEN AND PELVIS FINDINGS Atherosclerosis of the abdominal aorta without dissection. Flaring of the infrarenal portion measuring 2.4 cm maximal dimension without aneurysm. Atherosclerosis involves the origin of the a celiac, superior mesenteric, and inferior mesenteric artery which remain patent without abrupt occlusion. Single bilateral renal arteries are patent, atherosclerosis  involving the origin on the right. Normal caliber inguinal arteries bilaterally. Arterial phase imaging of the liver, gallbladder, spleen, adrenal glands, and pancreas are unremarkable. Scattered granuloma in the liver. Stomach is physiologically distended with ingested contents. 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 No dilated or thickened small bowel loops. There is diverticulosis throughout the colon without evidence of diverticulitis. Appendix not seen. No free air, free fluid or intra-abdominal fluid collection. Symmetric renal enhancement. No hydronephrosis. Heterogeneous lesion arising from the upper right kidney with questionable enhancement on arterial phase imaging, unchanged in size from prior CT. Additional hypodense lesion in the anterior interpolar right kidney is likely a simple cyst. Within the pelvis gallbladder is physiologically distended. Prominence of prostate gland, similar to prior. No pelvic free fluid. There is no retroperitoneal adenopathy. Review of the MIP images confirms the above findings. IMPRESSION: 1. 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 No acute aortic syndrome. 2. No definite acute abnormality in the chest abdomen or pelvis. 3. Heterogeneous 1.4 cm lesion arising from the upper right kidney with questionable arterial enhancement. This is unchanged in size from prior exam. Recommend further characterization with nonemergent renal protocol MRI. 4. 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 Electronically Signed   By: Jeb Levering M.D.   On: 03/28/2015 23:12   I have personally reviewed and evaluated these images and lab results as part of my medical decision-making.   EKG Interpretation   Date/Time:  Sunday March 28 2015 21:30:37 EST Ventricular Rate:  57 PR Interval:  192 QRS Duration: 119 QT Interval:  475 QTC Calculation:  462 R Axis:   78 Text Interpretation:  Sinus rhythm Nonspecific intraventricular conduction  delay Abnormal inferior Q waves No significant change since last tracing  Reconfirmed by Lalena Salas MD, Quillian Quince [phone]) on 03/29/2015 1:57:04 PM      MDM   Final diagnoses:  Epigastric pain    75 yo M with a chief complaint of sudden onset chest pain with radiation to the back. Patient story concerning for possible dissection. CT dissection ordered. Patient initially hypertensive but not tachycardic. Labetalol ordered the patient's blood pressure improved with morphine only. Initial lactic acid elevated at 3.  Turned over to Dr. Claudine Mouton.  If pain not controlled or lactic acid not cleared likely need admit for obs.  The patients results and plan were reviewed and discussed.   Any x-rays performed were independently reviewed by myself.   Differential diagnosis were considered with the presenting HPI.  Medications  0.9 %  sodium chloride infusion (0 mLs Intravenous Stopped 03/28/15 2306)  ondansetron (ZOFRAN) injection 4 mg (4 mg Intravenous Given 03/28/15 2211)  morphine 4 MG/ML injection 8 mg (8 mg Intravenous Given 03/28/15 2211)  iohexol (OMNIPAQUE) 350 MG/ML injection 100 mL (100 mLs Intravenous Contrast Given 03/28/15 2230)  sodium chloride 0.9 %  bolus 1,000 mL (0 mLs Intravenous Stopped 03/28/15 2355)  aspirin chewable tablet 324 mg (324 mg Oral Given 03/28/15 2350)  alum & mag hydroxide-simeth (MAALOX/MYLANTA) 200-200-20 MG/5ML suspension 15 mL (15 mLs Oral Given 03/28/15 2352)  lidocaine (XYLOCAINE) 2 % viscous mouth solution 15 mL (15 mLs Mouth/Throat Given 03/28/15 2352)  morphine 4 MG/ML injection 8 mg (8 mg Intravenous Given 03/29/15 0013)  ondansetron (ZOFRAN) injection 4 mg (4 mg Intravenous Given 03/29/15 0013)    Filed Vitals:   03/29/15 0145 03/29/15 0200 03/29/15 0215 03/29/15 0230  BP: 120/63 120/64 115/63 120/63  Pulse: 60 61 62 55  Temp:      TempSrc:      Resp: 14 14 12 12   Height:        Weight:      SpO2: 92% 92% 91% 93%    Final diagnoses:  Epigastric pain       Deno Etienne, DO 03/29/15 1358

## 2015-03-29 DIAGNOSIS — R1013 Epigastric pain: Secondary | ICD-10-CM | POA: Diagnosis not present

## 2015-03-29 LAB — URINALYSIS, ROUTINE W REFLEX MICROSCOPIC
Bilirubin Urine: NEGATIVE
Glucose, UA: 100 mg/dL — AB
HGB URINE DIPSTICK: NEGATIVE
Ketones, ur: NEGATIVE mg/dL
Leukocytes, UA: NEGATIVE
Nitrite: NEGATIVE
PH: 6.5 (ref 5.0–8.0)
Protein, ur: NEGATIVE mg/dL
SPECIFIC GRAVITY, URINE: 1.03 (ref 1.005–1.030)

## 2015-03-29 LAB — TROPONIN I: Troponin I: <0.03 ng/mL (ref <0.031)

## 2015-03-29 LAB — I-STAT CG4 LACTIC ACID, ED: LACTIC ACID, VENOUS: 0.82 mmol/L (ref 0.5–2.0)

## 2015-03-29 MED ORDER — MORPHINE SULFATE (PF) 4 MG/ML IV SOLN
8.0000 mg | Freq: Once | INTRAVENOUS | Status: AC
  Administered 2015-03-29: 8 mg via INTRAVENOUS
  Filled 2015-03-29: qty 2

## 2015-03-29 MED ORDER — ONDANSETRON HCL 4 MG/2ML IJ SOLN
4.0000 mg | Freq: Once | INTRAMUSCULAR | Status: AC
  Administered 2015-03-29: 4 mg via INTRAVENOUS
  Filled 2015-03-29: qty 2

## 2015-03-29 NOTE — ED Provider Notes (Signed)
Patient presents emergency department for sudden onset midepigastric abdominal pain radiating to his back. He also describes left upper quadrant pain. He does have a history of reflux and this may have been a severe exacerbation. I was signed out as pending repeat lactate and troponin. Troponin is negative, lactate has come down from 3-0.8 after IV fluids. He currently has no pain and has no tenderness on exam. I will repeat EKG to ensure this is not any ischemia.   EKG is unchanged.  He was advised to see his primary care physician within 3 days for close follow-up. Patient appears well in no acute distress, vital signs remain within his normal limits and he is safe for discharge.  Everlene Balls, MD 03/29/15 [phone]

## 2015-03-29 NOTE — Discharge Instructions (Signed)
Abdominal Pain, Adult John Livingston, your blood work and CT scan today did not show a cause of your pain.  Your CT scan results are below and they need follow up with your primary doctor.  See your primary doctor within 3 days for close follow up.  If you are on a beta blocker (including eye drops) this can cause you to wheeze at night.  For any worsening symptoms, come back to the ED immediately. Thank you.  Heterogeneous 1.4 cm lesion arising from the upper right kidney with questionable arterial enhancement. This is unchanged in size from prior exam. Recommend further characterization with nonemergent renal protocol MRI.  Many things can cause belly (abdominal) pain. Most times, the belly pain is not dangerous. Many cases of belly pain can be watched and treated at home. HOME CARE   Do not take medicines that help you go poop (laxatives) unless told to by your doctor.  Only take medicine as told by your doctor.  Eat or drink as told by your doctor. Your doctor will tell you if you should be on a special diet. GET HELP IF:  You do not know what is causing your belly pain.  You have belly pain while you are sick to your stomach (nauseous) or have runny poop (diarrhea).  You have pain while you pee or poop.  Your belly pain wakes you up at night.  You have belly pain that gets worse or better when you eat.  You have belly pain that gets worse when you eat fatty foods.  You have a fever. GET HELP RIGHT AWAY IF:   The pain does not go away within 2 hours.  You keep throwing up (vomiting).  The pain changes and is only in the right or left part of the belly.  You have bloody or tarry looking poop. MAKE SURE YOU:   Understand these instructions.  Will watch your condition.  Will get help right away if you are not doing well or get worse.   This information is not intended to replace advice given to you by your health care provider. Make sure you discuss any questions you have  with your health care provider.   Document Released: 08/23/2007 Document Revised: 03/27/2014 Document Reviewed: 11/13/2012 Elsevier Interactive Patient Education [employer].

## 2015-07-07 ENCOUNTER — Emergency Department (HOSPITAL_BASED_OUTPATIENT_CLINIC_OR_DEPARTMENT_OTHER)
Admission: EM | Admit: 2015-07-07 | Discharge: 2015-07-08 | Disposition: A | Discharge status: Home / Self Care | Payer: Medicare Other | Attending: Emergency Medicine | Admitting: Emergency Medicine

## 2015-07-07 ENCOUNTER — Encounter (HOSPITAL_BASED_OUTPATIENT_CLINIC_OR_DEPARTMENT_OTHER): Payer: Self-pay | Admitting: *Deleted

## 2015-07-07 DIAGNOSIS — J4540 Moderate persistent asthma, uncomplicated: Secondary | ICD-10-CM | POA: Insufficient documentation

## 2015-07-07 DIAGNOSIS — Y9289 Other specified places as the place of occurrence of the external cause: Secondary | ICD-10-CM | POA: Insufficient documentation

## 2015-07-07 DIAGNOSIS — Z79899 Other long term (current) drug therapy: Secondary | ICD-10-CM | POA: Insufficient documentation

## 2015-07-07 DIAGNOSIS — Z87448 Personal history of other diseases of urinary system: Secondary | ICD-10-CM | POA: Insufficient documentation

## 2015-07-07 DIAGNOSIS — Z87891 Personal history of nicotine dependence: Secondary | ICD-10-CM | POA: Diagnosis not present

## 2015-07-07 DIAGNOSIS — Z8739 Personal history of other diseases of the musculoskeletal system and connective tissue: Secondary | ICD-10-CM | POA: Insufficient documentation

## 2015-07-07 DIAGNOSIS — Y9389 Activity, other specified: Secondary | ICD-10-CM | POA: Insufficient documentation

## 2015-07-07 DIAGNOSIS — Z7951 Long term (current) use of inhaled steroids: Secondary | ICD-10-CM | POA: Diagnosis not present

## 2015-07-07 DIAGNOSIS — S0592XA Unspecified injury of left eye and orbit, initial encounter: Secondary | ICD-10-CM

## 2015-07-07 DIAGNOSIS — H1132 Conjunctival hemorrhage, left eye: Secondary | ICD-10-CM | POA: Diagnosis not present

## 2015-07-07 DIAGNOSIS — X010XXA Exposure to flames in uncontrolled fire, not in building or structure, initial encounter: Secondary | ICD-10-CM | POA: Diagnosis not present

## 2015-07-07 DIAGNOSIS — Z8509 Personal history of malignant neoplasm of other digestive organs: Secondary | ICD-10-CM | POA: Diagnosis not present

## 2015-07-07 DIAGNOSIS — Y998 Other external cause status: Secondary | ICD-10-CM | POA: Diagnosis not present

## 2015-07-07 MED ORDER — TETRACAINE HCL 0.5 % OP SOLN
2.0000 [drp] | Freq: Once | OPHTHALMIC | Status: AC
  Administered 2015-07-08: 2 [drp] via OPHTHALMIC
  Filled 2015-07-07: qty 4

## 2015-07-07 MED ORDER — FLUORESCEIN SODIUM 1 MG OP STRP
ORAL_STRIP | OPHTHALMIC | Status: AC
  Administered 2015-07-08: 1
  Filled 2015-07-07: qty 1

## 2015-07-07 MED ORDER — OFLOXACIN 0.3 % OP SOLN
1.0000 [drp] | OPHTHALMIC | Status: DC
  Administered 2015-07-08: 1 [drp] via OPHTHALMIC
  Filled 2015-07-07: qty 5

## 2015-07-07 NOTE — ED Notes (Signed)
Pt c/o foreign body in left eye x 1 hr

## 2015-07-07 NOTE — ED Provider Notes (Signed)
CSN: [id number]     Arrival date & time 07/07/15  2151 History  By signing my name below, I, Dora Sims, attest that this documentation has been prepared under the direction and in the presence of physician practitioner, Shanon Rosser, MD. Electronically Signed: Dora Sims, Scribe. 07/07/2015. 11:42 PM.  Chief Complaint  Patient presents with  . Foreign Body in Eye    The history is provided by the patient. No language interpreter was used.     HPI Comments: John Livingston is a 75 y.o. male with no pertinent PMHx who presents to the Emergency Department complaining of foreign body in his left eye after burning brush approximately 3 hours ago. Pt reports that he felt something go into his left eye and states it feels like it is still there. He endorses mild lateral left eye pain. Pt denies blurry vision in the affected eye eye. His ophthalmologist is at [facility] and he intends to be seen there tomorrow.  Past Medical History  Diagnosis Date  . Baker's cyst of knee     left  . Moderate persistent asthma   . Cancer of appendix ([place])   . Ureteral stone    Past Surgical History  Procedure Laterality Date  . Appendectomy    . Cartilage surgery in nose     Family History  Problem Relation Age of Onset  . Asthma Son     as a child  . Heart disease Mother   . Prostate cancer Father    Social History  Substance Use Topics  . Smoking status: Former Smoker -- 1.00 packs/day for 20 years    Types: Cigarettes    Quit date: 03/20/1978  . Smokeless tobacco: Never Used  . Alcohol Use: 0.6 oz/week    1 Glasses of wine per week     Comment: a glass of wine nightly    Review of Systems  A complete 10 system review of systems was obtained and all systems are negative except as noted in the HPI and PMH.   Allergies  Review of patient's allergies indicates no known allergies.  Home Medications   Prior to Admission medications   Medication Sig Start Date End Date  Taking? Authorizing Provider  albuterol (PROVENTIL HFA;VENTOLIN HFA) 108 (90 BASE) MCG/ACT inhaler Inhale 2 puffs into the lungs every 6 (six) hours as needed for wheezing or shortness of breath. 04/18/12   Elsie Stain, MD  Aromatic Inhalants (VICKS VAPOR INHALER IN) Inhale into the lungs at bedtime.    Historical Provider, MD  clobetasol cream (TEMOVATE) 0.05 % Apply topically. As directed    Historical Provider, MD  Doxylamine Succinate, Sleep, (SLEEP AID PO) Take 2 tablets by mouth at bedtime.    Historical Provider, MD  hydrochlorothiazide (HYDRODIURIL) 25 MG tablet Take 1 tablet (25 mg total) by mouth daily. 04/30/13   April Palumbo, MD  HYDROcodone-acetaminophen (NORCO/VICODIN) 5-325 MG per tablet Take 1-2 tablets by mouth every 6 (six) hours as needed for moderate pain. 08/02/13   Fredia Sorrow, MD  mometasone (ASMANEX 120 METERED DOSES) 220 MCG/INH inhaler Inhale 2 puffs into the lungs daily. 04/18/12   Elsie Stain, MD  omeprazole (PRILOSEC) 20 MG capsule Take 1 capsule (20 mg total) by mouth daily. 04/18/12   Elsie Stain, MD  oxyCODONE-acetaminophen (PERCOCET) 5-325 MG per tablet Take 1 tablet by mouth every 6 (six) hours as needed. 04/30/13   April Palumbo, MD  TRAVATAN Z 0.004 % SOLN ophthalmic solution Place  1 drop into both eyes 2 (two) times daily.    Historical Provider, MD  vardenafil (LEVITRA) 10 MG tablet Take 10 mg by mouth daily as needed for erectile dysfunction.    Historical Provider, MD   BP 148/78 mmHg  Pulse 78  Temp(Src) 98.1 F (36.7 C) (Oral)  Resp 18  Ht 5\' 9"  (1.753 m)  Wt 190 lb (86.183 kg)  BMI 28.05 kg/m2  SpO2 98%   Physical Exam  General: Well-developed, well-nourished male in no acute distress; appearance consistent with age of record HENT: normocephalic; No periorbital trauma seen Eyes: pupils equal, round and reactive to light; extraocular muscles intact; left subconjunctival hemorrhage involving most of the visible globe with associated  chemosis; no fluorescein uptake; no foreign body seen even with lid eversion Neck: supple Heart: regular rate and rhythm Lungs: clear to auscultation bilaterally Abdomen: soft; nondistended; nontender; no masses or hepatosplenomegaly; bowel sounds present Extremities: No deformity; full range of motion Neurologic: Awake, alert and oriented; motor function intact in all extremities and symmetric; no facial droop Skin: Warm and dry Psychiatric: Normal mood and affect  ED Course  Procedures (including critical care time)  DIAGNOSTIC STUDIES: Oxygen Saturation is 98% on RA, normal by my interpretation.     MDM    Final diagnoses:  Subconjunctival hemorrhage, left  Eye injury, non-penetrating, left, initial encounter    I personally performed the services described in this documentation, which was scribed in my presence. The recorded information has been reviewed and is accurate.   Shanon Rosser, MD 07/07/15 [phone]

## 2015-07-07 NOTE — Discharge Instructions (Signed)
Subconjunctival Hemorrhage °Subconjunctival hemorrhage is bleeding that happens between the white part of your eye (sclera) and the clear membrane that covers the outside of your eye (conjunctiva). There are many tiny blood vessels near the surface of your eye. A subconjunctival hemorrhage happens when one or more of these vessels breaks and bleeds, causing a red patch to appear on your eye. This is similar to a bruise. °Depending on the amount of bleeding, the red patch may only cover a small area of your eye or it may cover the entire visible part of the sclera. If a lot of blood collects under the conjunctiva, there may also be swelling. Subconjunctival hemorrhages do not affect your vision or cause pain, but your eye may feel irritated if there is swelling. Subconjunctival hemorrhages usually do not require treatment, and they disappear on their own within two weeks. °CAUSES °This condition may be caused by: °· Mild trauma, such as rubbing your eye too hard. °· Severe trauma or blunt injuries. °· Coughing, sneezing, or vomiting. °· Straining, such as when lifting a heavy object. °· High blood pressure. °· Recent eye surgery. °· A history of diabetes. °· Certain medicines, especially blood thinners (anticoagulants). °· Other conditions, such as eye tumors, bleeding disorders, or blood vessel abnormalities. °Subconjunctival hemorrhages can happen without an obvious cause.  °SYMPTOMS  °Symptoms of this condition include: °· A bright red or dark red patch on the white part of the eye. °¨ The red area may spread out to cover a larger area of the eye before it goes away. °¨ The red area may turn brownish-yellow before it goes away. °· Swelling. °· Mild eye irritation. °DIAGNOSIS °This condition is diagnosed with a physical exam. If your subconjunctival hemorrhage was caused by trauma, your health care provider may refer you to an eye specialist (ophthalmologist) or another specialist to check for other injuries. You  may have other tests, including: °· An eye exam. °· A blood pressure check. °· Blood tests to check for bleeding disorders. °If your subconjunctival hemorrhage was caused by trauma, X-rays or a CT scan may be done to check for other injuries. °TREATMENT °Usually, no treatment is needed. Your health care provider may recommend eye drops or cold compresses to help with discomfort. °HOME CARE INSTRUCTIONS °· Take over-the-counter and prescription medicines only as directed by your health care provider. °· Use eye drops or cold compresses to help with discomfort as directed by your health care provider. °· Avoid activities, things, and environments that may irritate or injure your eye. °· Keep all follow-up visits as told by your health care provider. This is important. °SEEK MEDICAL CARE IF: °· You have pain in your eye. °· The bleeding does not go away within 3 weeks. °· You keep getting new subconjunctival hemorrhages. °SEEK IMMEDIATE MEDICAL CARE IF: °· Your vision changes or you have difficulty seeing. °· You suddenly develop severe sensitivity to light. °· You develop a severe headache, persistent vomiting, confusion, or abnormal tiredness (lethargy). °· Your eye seems to bulge or protrude from your eye socket. °· You develop unexplained bruises on your body. °· You have unexplained bleeding in another area of your body. °  °This information is not intended to replace advice given to you by your health care provider. Make sure you discuss any questions you have with your health care provider. °  °Document Released: 03/06/2005 Document Revised: 11/25/2014 Document Reviewed: 05/13/2014 °Elsevier Interactive Patient Education ©2016 [employer] ° °

## 2015-07-07 NOTE — ED Notes (Signed)
foreign body in left eye while burning brush,  Left eye has bloody swelling in left side of eye

## 2015-07-08 DIAGNOSIS — H1132 Conjunctival hemorrhage, left eye: Secondary | ICD-10-CM | POA: Diagnosis not present

## 2018-01-28 ENCOUNTER — Emergency Department (HOSPITAL_BASED_OUTPATIENT_CLINIC_OR_DEPARTMENT_OTHER)
Admission: EM | Admit: 2018-01-28 | Discharge: 2018-01-28 | Disposition: A | Discharge status: Home / Self Care | Payer: Medicare Other | Attending: Emergency Medicine | Admitting: Emergency Medicine

## 2018-01-28 ENCOUNTER — Other Ambulatory Visit: Payer: Self-pay

## 2018-01-28 ENCOUNTER — Encounter (HOSPITAL_BASED_OUTPATIENT_CLINIC_OR_DEPARTMENT_OTHER): Payer: Self-pay

## 2018-01-28 ENCOUNTER — Emergency Department (HOSPITAL_BASED_OUTPATIENT_CLINIC_OR_DEPARTMENT_OTHER): Payer: Medicare Other

## 2018-01-28 DIAGNOSIS — J019 Acute sinusitis, unspecified: Secondary | ICD-10-CM | POA: Diagnosis not present

## 2018-01-28 DIAGNOSIS — R059 Cough, unspecified: Secondary | ICD-10-CM

## 2018-01-28 DIAGNOSIS — R05 Cough: Secondary | ICD-10-CM

## 2018-01-28 DIAGNOSIS — Z87891 Personal history of nicotine dependence: Secondary | ICD-10-CM | POA: Diagnosis not present

## 2018-01-28 DIAGNOSIS — J45909 Unspecified asthma, uncomplicated: Secondary | ICD-10-CM | POA: Diagnosis not present

## 2018-01-28 DIAGNOSIS — Z79899 Other long term (current) drug therapy: Secondary | ICD-10-CM | POA: Diagnosis not present

## 2018-01-28 DIAGNOSIS — Z8589 Personal history of malignant neoplasm of other organs and systems: Secondary | ICD-10-CM | POA: Diagnosis not present

## 2018-01-28 DIAGNOSIS — J0110 Acute frontal sinusitis, unspecified: Secondary | ICD-10-CM

## 2018-01-28 MED ORDER — FLUTICASONE PROPIONATE 50 MCG/ACT NA SUSP: 2.0000 | Freq: Every day | NASAL | 0 refills | Status: DC

## 2018-01-28 MED ORDER — MOMETASONE FURO-FORMOTEROL FUM 100-5 MCG/ACT IN AERO: 2.0000 | INHALATION_SPRAY | Freq: Two times a day (BID) | RESPIRATORY_TRACT | 0 refills | Status: DC | PRN

## 2018-01-28 MED ORDER — BENZONATATE 100 MG PO CAPS: 100.0000 mg | ORAL_CAPSULE | Freq: Three times a day (TID) | ORAL | 0 refills | Status: DC | PRN

## 2018-01-28 MED ORDER — AMOXICILLIN-POT CLAVULANATE 875-125 MG PO TABS: 1.0000 | ORAL_TABLET | Freq: Two times a day (BID) | ORAL | 0 refills | Status: DC

## 2018-01-28 MED FILL — AMOX-CLAV 875-125 MG TABLET: 875-125 | 7 days supply | Qty: 14 | Fill #0

## 2018-01-28 MED FILL — BENZONATATE 100 MG CAPSULE: 100 | 7 days supply | Qty: 21 | Fill #0

## 2018-01-28 MED FILL — FLUTICASONE PROP 50 MCG SPR: 50 | 30 days supply | Qty: 16 | Fill #0

## 2018-01-28 NOTE — ED Provider Notes (Signed)
[facility] Provider Note   CSN: [id number] Arrival date & time: 01/28/18  1143     History   Chief Complaint Chief Complaint  Patient presents with  . Cough    HPI John Livingston is a 77 y.o. male.  The history is provided by the patient.  Cough  The current episode started more than 1 week ago. The problem occurs constantly. The problem has been gradually worsening. The cough is productive of sputum. There has been no fever. Associated symptoms include ear congestion, ear pain, rhinorrhea, sore throat and wheezing. He has tried decongestants for the symptoms. The treatment provided mild relief. He is not a smoker.    Past Medical History:  Diagnosis Date  . Baker's cyst of knee    left  . Cancer of appendix ([place])   . Moderate persistent asthma   . Ureteral stone     Patient Active Problem List   Diagnosis Date Noted  . Moderate persistent asthma with reflux disease 04/18/2012  . Dysphagia 04/18/2012  . GERD (gastroesophageal reflux disease) 04/18/2012    Past Surgical History:  Procedure Laterality Date  . APPENDECTOMY    . cartilage surgery in nose          Home Medications    Prior to Admission medications   Medication Sig Start Date End Date Taking? Authorizing Provider  albuterol (PROVENTIL HFA;VENTOLIN HFA) 108 (90 BASE) MCG/ACT inhaler Inhale 2 puffs into the lungs every 6 (six) hours as needed for wheezing or shortness of breath. 04/18/12   Elsie Stain, MD  amoxicillin-clavulanate (AUGMENTIN) 875-125 MG tablet Take 1 tablet by mouth 2 (two) times daily. One po bid x 7 days 01/28/18   Duaine Radin, Corene Cornea, MD  Aromatic Inhalants (VICKS VAPOR INHALER IN) Inhale into the lungs at bedtime.    [provider]  benzonatate (TESSALON) 100 MG capsule Take 1 capsule (100 mg total) by mouth 3 (three) times daily as needed for cough. 01/28/18   Jacalynn Buzzell, Corene Cornea, MD  clobetasol cream (TEMOVATE) 0.05 % Apply topically. As  directed    [provider]  Doxylamine Succinate, Sleep, (SLEEP AID PO) Take 2 tablets by mouth at bedtime.    [provider]  fluticasone (FLONASE) 50 MCG/ACT nasal spray Place 2 sprays into both nostrils daily. 01/28/18   Mazi Brailsford, Corene Cornea, MD  hydrochlorothiazide (HYDRODIURIL) 25 MG tablet Take 1 tablet (25 mg total) by mouth daily. 04/30/13   Palumbo, April, MD  HYDROcodone-acetaminophen (NORCO/VICODIN) 5-325 MG per tablet Take 1-2 tablets by mouth every 6 (six) hours as needed for moderate pain. 08/02/13   Fredia Sorrow, MD  mometasone (ASMANEX 120 METERED DOSES) 220 MCG/INH inhaler Inhale 2 puffs into the lungs daily. 04/18/12   Elsie Stain, MD  mometasone-formoterol (DULERA) 100-5 MCG/ACT AERO Inhale 2 puffs into the lungs 2 (two) times daily as needed for wheezing. 01/28/18   Perlie Scheuring, Corene Cornea, MD  omeprazole (PRILOSEC) 20 MG capsule Take 1 capsule (20 mg total) by mouth daily. 04/18/12   Elsie Stain, MD  oxyCODONE-acetaminophen (PERCOCET) 5-325 MG per tablet Take 1 tablet by mouth every 6 (six) hours as needed. 04/30/13   Palumbo, April, MD  TRAVATAN Z 0.004 % SOLN ophthalmic solution Place 1 drop into both eyes 2 (two) times daily.    [provider]  vardenafil (LEVITRA) 10 MG tablet Take 10 mg by mouth daily as needed for erectile dysfunction.    [provider]    Family History Family History  Problem Relation Age of Onset  . Asthma Son        as a child  . Heart disease Mother   . Prostate cancer Father     Social History Social History   Tobacco Use  . Smoking status: Former Smoker    Packs/day: 1.00    Years: 20.00    Pack years: 20.00    Types: Cigarettes    Last attempt to quit: 03/20/1978    Years since quitting: 39.8  . Smokeless tobacco: Never Used  Substance Use Topics  . Alcohol use: Yes    Comment: occ  . Drug use: No     Allergies   Patient has no known allergies.   Review of Systems Review of Systems    HENT: Positive for ear pain, rhinorrhea and sore throat.   Respiratory: Positive for cough and wheezing.   All other systems reviewed and are negative.    Physical Exam Updated Vital Signs BP (!) 154/79 (BP Location: Left Arm)   Pulse 68   Temp 98.4 F (36.9 C) (Oral)   Resp 18   Ht 5\' 9"  (1.753 m)   Wt 84.8 kg   SpO2 98%   BMI 27.62 kg/m   Physical Exam  Constitutional: He is oriented to person, place, and time. He appears well-developed and well-nourished.  HENT:  Head: Normocephalic and atraumatic.  Eyes: Conjunctivae and EOM are normal.  Neck: Normal range of motion.  Cardiovascular: Normal rate.  Pulmonary/Chest: Effort normal. No respiratory distress.  Abdominal: Soft. Bowel sounds are normal. He exhibits no distension.  Musculoskeletal: Normal range of motion. He exhibits no edema or deformity.  Neurological: He is alert and oriented to person, place, and time. No cranial nerve deficit. Coordination normal.  Skin: Skin is warm and dry.  Nursing note and vitals reviewed.    ED Treatments / Results  Labs (all labs ordered are listed, but only abnormal results are displayed) Labs Reviewed - No data to display  EKG None  Radiology Dg Chest 2 View  Result Date: 01/28/2018 CLINICAL DATA:  Cough EXAM: CHEST - 2 VIEW COMPARISON:  03/28/2015 CT, 04/29/2013 chest two-view FINDINGS: The heart size and mediastinal contours are within normal limits. Both lungs are clear. The visualized skeletal structures are unremarkable. IMPRESSION: No active cardiopulmonary disease. Electronically Signed   By: Franchot Gallo M.D.   On: 01/28/2018 12:34    Procedures Procedures (including critical care time)  Medications Ordered in ED Medications - No data to display   Initial Impression / Assessment and Plan / ED Course  I have reviewed the triage vital signs and the nursing notes.  Pertinent labs & imaging results that were available during my care of the patient were  reviewed by me and considered in my medical decision making (see chart for details).  Patient with a negative chest x-ray but approximately 8 to 10 days of sinusitis symptoms.  Also is 77 years old so we will treat with antibiotics and symptom medic treatment.  Dulera refilled per patient request.  Final Clinical Impressions(s) / ED Diagnoses   Final diagnoses:  Cough  Acute non-recurrent frontal sinusitis    ED Discharge Orders         Ordered    mometasone-formoterol (DULERA) 100-5 MCG/ACT AERO  2 times daily PRN     01/28/18 1444    benzonatate (TESSALON) 100 MG capsule  3 times daily PRN     01/28/18 1444    amoxicillin-clavulanate (AUGMENTIN) 875-125 MG  tablet  2 times daily     01/28/18 1444    fluticasone (FLONASE) 50 MCG/ACT nasal spray  Daily     01/28/18 1446           Verlinda Slotnick, Corene Cornea, MD 01/29/18 1538

## 2018-01-28 NOTE — ED Triage Notes (Signed)
C/o flu like sx x 10 days-NAD-steady gait

## 2019-01-10 ENCOUNTER — Other Ambulatory Visit: Payer: Self-pay

## 2019-01-10 ENCOUNTER — Emergency Department (HOSPITAL_BASED_OUTPATIENT_CLINIC_OR_DEPARTMENT_OTHER): Payer: Medicare Other

## 2019-01-10 ENCOUNTER — Emergency Department (HOSPITAL_BASED_OUTPATIENT_CLINIC_OR_DEPARTMENT_OTHER)
Admission: EM | Admit: 2019-01-10 | Discharge: 2019-01-10 | Disposition: A | Discharge status: Home / Self Care | Payer: Medicare Other | Attending: Emergency Medicine | Admitting: Emergency Medicine

## 2019-01-10 ENCOUNTER — Encounter (HOSPITAL_BASED_OUTPATIENT_CLINIC_OR_DEPARTMENT_OTHER): Payer: Self-pay | Admitting: Emergency Medicine

## 2019-01-10 DIAGNOSIS — K5732 Diverticulitis of large intestine without perforation or abscess without bleeding: Secondary | ICD-10-CM | POA: Insufficient documentation

## 2019-01-10 DIAGNOSIS — K8050 Calculus of bile duct without cholangitis or cholecystitis without obstruction: Secondary | ICD-10-CM

## 2019-01-10 DIAGNOSIS — J4540 Moderate persistent asthma, uncomplicated: Secondary | ICD-10-CM | POA: Diagnosis not present

## 2019-01-10 DIAGNOSIS — Z87891 Personal history of nicotine dependence: Secondary | ICD-10-CM | POA: Insufficient documentation

## 2019-01-10 DIAGNOSIS — K5792 Diverticulitis of intestine, part unspecified, without perforation or abscess without bleeding: Secondary | ICD-10-CM

## 2019-01-10 DIAGNOSIS — K8020 Calculus of gallbladder without cholecystitis without obstruction: Secondary | ICD-10-CM | POA: Insufficient documentation

## 2019-01-10 DIAGNOSIS — R1084 Generalized abdominal pain: Secondary | ICD-10-CM | POA: Diagnosis not present

## 2019-01-10 DIAGNOSIS — Z79899 Other long term (current) drug therapy: Secondary | ICD-10-CM | POA: Diagnosis not present

## 2019-01-10 DIAGNOSIS — R0789 Other chest pain: Secondary | ICD-10-CM | POA: Diagnosis present

## 2019-01-10 LAB — CBC
HCT: 36.4 % — ABNORMAL LOW (ref 39.0–52.0)
Hemoglobin: 12.5 g/dL — ABNORMAL LOW (ref 13.0–17.0)
MCH: 30.7 pg (ref 26.0–34.0)
MCHC: 34.3 g/dL (ref 30.0–36.0)
MCV: 89.4 fL (ref 80.0–100.0)
Platelets: 189 10*3/uL (ref 150–400)
RBC: 4.07 MIL/uL — ABNORMAL LOW (ref 4.22–5.81)
RDW: 13.3 % (ref 11.5–15.5)
WBC: 5 10*3/uL (ref 4.0–10.5)
nRBC: 0 % (ref 0.0–0.2)

## 2019-01-10 LAB — COMPREHENSIVE METABOLIC PANEL
ALT: 15 U/L (ref 0–44)
AST: 21 U/L (ref 15–41)
Albumin: 4.1 g/dL (ref 3.5–5.0)
Alkaline Phosphatase: 54 U/L (ref 38–126)
Anion gap: 10 (ref 5–15)
BUN: 18 mg/dL (ref 8–23)
CO2: 24 mmol/L (ref 22–32)
Calcium: 9.1 mg/dL (ref 8.9–10.3)
Chloride: 103 mmol/L (ref 98–111)
Creatinine, Ser: 1.36 mg/dL — ABNORMAL HIGH (ref 0.61–1.24)
GFR calc Af Amer: 57 mL/min — ABNORMAL LOW (ref >60)
GFR calc non Af Amer: 49 mL/min — ABNORMAL LOW (ref >60)
Glucose, Bld: 155 mg/dL — ABNORMAL HIGH (ref 70–99)
Potassium: 3.8 mmol/L (ref 3.5–5.1)
Sodium: 137 mmol/L (ref 135–145)
Total Bilirubin: 0.7 mg/dL (ref 0.3–1.2)
Total Protein: 6.4 g/dL — ABNORMAL LOW (ref 6.5–8.1)

## 2019-01-10 LAB — URINALYSIS, ROUTINE W REFLEX MICROSCOPIC
Bilirubin Urine: NEGATIVE
Glucose, UA: NEGATIVE mg/dL
Hgb urine dipstick: NEGATIVE
Ketones, ur: NEGATIVE mg/dL
Leukocytes,Ua: NEGATIVE
Nitrite: NEGATIVE
Protein, ur: NEGATIVE mg/dL
Specific Gravity, Urine: 1.025 (ref 1.005–1.030)
pH: 5.5 (ref 5.0–8.0)

## 2019-01-10 LAB — TROPONIN I (HIGH SENSITIVITY)
Troponin I (High Sensitivity): 2 ng/L (ref <18)
Troponin I (High Sensitivity): 2 ng/L (ref <18)

## 2019-01-10 LAB — LIPASE, BLOOD: Lipase: 49 U/L (ref 11–51)

## 2019-01-10 LAB — D-DIMER, QUANTITATIVE: D-Dimer, Quant: <0.27 ug/mL-FEU (ref 0.00–0.50)

## 2019-01-10 MED ORDER — SODIUM CHLORIDE 0.9 % IV BOLUS
500.0000 mL | Freq: Once | INTRAVENOUS | Status: AC
  Administered 2019-01-10: 21:00:00 500 mL via INTRAVENOUS

## 2019-01-10 MED ORDER — HYDROMORPHONE HCL 1 MG/ML IJ SOLN
1.0000 mg | Freq: Once | INTRAMUSCULAR | Status: AC
  Administered 2019-01-10: 1 mg via INTRAVENOUS
  Filled 2019-01-10: qty 1

## 2019-01-10 MED ORDER — CIPROFLOXACIN HCL 500 MG PO TABS
500.0000 mg | ORAL_TABLET | Freq: Once | ORAL | Status: AC
  Administered 2019-01-10: 500 mg via ORAL
  Filled 2019-01-10: qty 1

## 2019-01-10 MED ORDER — HYDROCODONE-ACETAMINOPHEN 5-325 MG PO TABS: 1.0000 | ORAL_TABLET | Freq: Four times a day (QID) | ORAL | 0 refills | Status: DC | PRN

## 2019-01-10 MED ORDER — ONDANSETRON HCL 4 MG/2ML IJ SOLN
4.0000 mg | Freq: Once | INTRAMUSCULAR | Status: AC
  Administered 2019-01-10: 4 mg via INTRAVENOUS
  Filled 2019-01-10: qty 2

## 2019-01-10 MED ORDER — METRONIDAZOLE 500 MG PO TABS
500.0000 mg | ORAL_TABLET | Freq: Once | ORAL | Status: AC
  Administered 2019-01-10: 500 mg via ORAL
  Filled 2019-01-10: qty 1

## 2019-01-10 MED ORDER — IOHEXOL 300 MG/ML  SOLN
100.0000 mL | Freq: Once | INTRAMUSCULAR | Status: AC | PRN
  Administered 2019-01-10: 100 mL via INTRAVENOUS

## 2019-01-10 MED ORDER — CIPROFLOXACIN HCL 500 MG PO TABS: 500.0000 mg | ORAL_TABLET | Freq: Two times a day (BID) | ORAL | 0 refills | Status: DC

## 2019-01-10 MED ORDER — HYDROMORPHONE HCL 1 MG/ML IJ SOLN
1.0000 mg | Freq: Once | INTRAMUSCULAR | Status: AC
  Administered 2019-01-10: 21:00:00 1 mg via INTRAVENOUS
  Filled 2019-01-10: qty 1

## 2019-01-10 MED ORDER — METRONIDAZOLE 500 MG PO TABS: 500.0000 mg | ORAL_TABLET | Freq: Three times a day (TID) | ORAL | 0 refills | Status: DC

## 2019-01-10 MED ORDER — SODIUM CHLORIDE 0.9 % IV SOLN
INTRAVENOUS | Status: DC
  Administered 2019-01-10: 22:00:00 via INTRAVENOUS

## 2019-01-10 MED ORDER — ONDANSETRON 4 MG PO TBDP: 4.0000 mg | ORAL_TABLET | Freq: Three times a day (TID) | ORAL | 1 refills | Status: DC | PRN

## 2019-01-10 NOTE — ED Notes (Signed)
ED Provider at bedside. 

## 2019-01-10 NOTE — ED Triage Notes (Signed)
Pt states he started having chest pain 20 minutes ago which has now traveled to his abdomen.  No sob, no N/V.  Denies exertion, last BM last night.

## 2019-01-10 NOTE — ED Notes (Signed)
Patient transported to CT 

## 2019-01-10 NOTE — ED Provider Notes (Signed)
[facility] EMERGENCY DEPARTMENT Provider Note   CSN: [id number] Arrival date & time: 01/10/19  1813     History   Chief Complaint Chief Complaint  Patient presents with   Chest Pain   Abdominal Pain    HPI ARYIAN Livingston is a 78 y.o. male.     Patient with acute onset of abdominal pain that started in the epigastric area at 6 PM this evening.  Felt fine before that.  Patient is undergoing radiation treatment for prostate cancer and had his first treatment today.  Associated with nausea but no vomiting.  Pain quickly spread from the epigastric area throughout the whole abdomen.  And did have some pain in the back area.  No chest pain.  No shortness of breath.  No fever but did have some chills.     Past Medical History:  Diagnosis Date   Baker's cyst of knee    left   Cancer of appendix ([place])    Moderate persistent asthma    Ureteral stone     Patient Active Problem List   Diagnosis Date Noted   Moderate persistent asthma with reflux disease 04/18/2012   Dysphagia 04/18/2012   GERD (gastroesophageal reflux disease) 04/18/2012    Past Surgical History:  Procedure Laterality Date   APPENDECTOMY     cartilage surgery in nose          Home Medications    Prior to Admission medications   Medication Sig Start Date End Date Taking? Authorizing Provider  albuterol (PROVENTIL HFA;VENTOLIN HFA) 108 (90 BASE) MCG/ACT inhaler Inhale 2 puffs into the lungs every 6 (six) hours as needed for wheezing or shortness of breath. 04/18/12   Elsie Stain, MD  amoxicillin-clavulanate (AUGMENTIN) 875-125 MG tablet Take 1 tablet by mouth 2 (two) times daily. One po bid x 7 days 01/28/18   Mesner, Corene Cornea, MD  Aromatic Inhalants (VICKS VAPOR INHALER IN) Inhale into the lungs at bedtime.    [provider]  benzonatate (TESSALON) 100 MG capsule Take 1 capsule (100 mg total) by mouth 3 (three) times daily as needed for cough. 01/28/18   Mesner, Corene Cornea, MD   ciprofloxacin (CIPRO) 500 MG tablet Take 1 tablet (500 mg total) by mouth 2 (two) times daily. 01/10/19   Fredia Sorrow, MD  clobetasol cream (TEMOVATE) 0.05 % Apply topically. As directed    [provider]  Doxylamine Succinate, Sleep, (SLEEP AID PO) Take 2 tablets by mouth at bedtime.    [provider]  fluticasone (FLONASE) 50 MCG/ACT nasal spray Place 2 sprays into both nostrils daily. 01/28/18   Mesner, Corene Cornea, MD  hydrochlorothiazide (HYDRODIURIL) 25 MG tablet Take 1 tablet (25 mg total) by mouth daily. 04/30/13   Palumbo, April, MD  HYDROcodone-acetaminophen (NORCO/VICODIN) 5-325 MG per tablet Take 1-2 tablets by mouth every 6 (six) hours as needed for moderate pain. 08/02/13   Fredia Sorrow, MD  HYDROcodone-acetaminophen (NORCO/VICODIN) 5-325 MG tablet Take 1 tablet by mouth every 6 (six) hours as needed for moderate pain. 01/10/19   Fredia Sorrow, MD  metroNIDAZOLE (FLAGYL) 500 MG tablet Take 1 tablet (500 mg total) by mouth 3 (three) times daily for 7 days. 01/10/19 01/17/19  Fredia Sorrow, MD  mometasone (ASMANEX 120 METERED DOSES) 220 MCG/INH inhaler Inhale 2 puffs into the lungs daily. 04/18/12   Elsie Stain, MD  mometasone-formoterol (DULERA) 100-5 MCG/ACT AERO Inhale 2 puffs into the lungs 2 (two) times daily as needed for wheezing. 01/28/18   Mesner, Corene Cornea,  MD  omeprazole (PRILOSEC) 20 MG capsule Take 1 capsule (20 mg total) by mouth daily. 04/18/12   Elsie Stain, MD  ondansetron (ZOFRAN ODT) 4 MG disintegrating tablet Take 1 tablet (4 mg total) by mouth every 8 (eight) hours as needed. 01/10/19   Fredia Sorrow, MD  oxyCODONE-acetaminophen (PERCOCET) 5-325 MG per tablet Take 1 tablet by mouth every 6 (six) hours as needed. 04/30/13   Palumbo, April, MD  TRAVATAN Z 0.004 % SOLN ophthalmic solution Place 1 drop into both eyes 2 (two) times daily.    [provider]  vardenafil (LEVITRA) 10 MG tablet Take 10 mg by mouth daily as needed  for erectile dysfunction.    [provider]    Family History Family History  Problem Relation Age of Onset   Asthma Son        as a child   Heart disease Mother    Prostate cancer Father     Social History Social History   Tobacco Use   Smoking status: Former Smoker    Packs/day: 1.00    Years: 20.00    Pack years: 20.00    Types: Cigarettes    Quit date: 03/20/1978    Years since quitting: 40.8   Smokeless tobacco: Never Used  Substance Use Topics   Alcohol use: Yes    Comment: occ   Drug use: No     Allergies   Patient has no known allergies.   Review of Systems Review of Systems  Constitutional: Positive for chills. Negative for fever.  HENT: Negative for congestion, rhinorrhea and sore throat.   Eyes: Negative for visual disturbance.  Respiratory: Negative for cough and shortness of breath.   Cardiovascular: Negative for chest pain and leg swelling.  Gastrointestinal: Positive for abdominal pain and nausea. Negative for diarrhea and vomiting.  Genitourinary: Negative for dysuria.  Musculoskeletal: Negative for back pain and neck pain.  Skin: Negative for rash.  Neurological: Negative for dizziness, light-headedness and headaches.  Hematological: Does not bruise/bleed easily.  Psychiatric/Behavioral: Negative for confusion.     Physical Exam Updated Vital Signs BP (!) 156/69 (BP Location: Right Arm)    Pulse 62    Temp 97.8 F (36.6 C) (Oral)    Resp 16    Ht 1.753 m (5\' 9" )    Wt 81.6 kg    SpO2 94%    BMI 26.58 kg/m   Physical Exam Vitals signs and nursing note reviewed.  Constitutional:      General: He is in acute distress.     Appearance: Normal appearance. He is well-developed.  HENT:     Head: Normocephalic and atraumatic.  Eyes:     Extraocular Movements: Extraocular movements intact.     Conjunctiva/sclera: Conjunctivae normal.     Pupils: Pupils are equal, round, and reactive to light.  Neck:     Musculoskeletal:  Normal range of motion and neck supple.  Cardiovascular:     Rate and Rhythm: Normal rate and regular rhythm.     Heart sounds: No murmur.  Pulmonary:     Effort: Pulmonary effort is normal. No respiratory distress.     Breath sounds: Normal breath sounds.  Abdominal:     Palpations: Abdomen is soft.     Tenderness: There is abdominal tenderness. There is no guarding.     Comments: Guarding.  But mild tenderness throughout the abdomen.  A little bit increased in right upper quadrant area.  Musculoskeletal: Normal range of motion.  Skin:  General: Skin is warm and dry.     Capillary Refill: Capillary refill takes less than 2 seconds.  Neurological:     General: No focal deficit present.     Mental Status: He is alert and oriented to person, place, and time.     Cranial Nerves: No cranial nerve deficit.     Sensory: No sensory deficit.     Motor: No weakness.      ED Treatments / Results  Labs (all labs ordered are listed, but only abnormal results are displayed) Labs Reviewed  CBC - Abnormal; Notable for the following components:      Result Value   RBC 4.07 (*)    Hemoglobin 12.5 (*)    HCT 36.4 (*)    All other components within normal limits  COMPREHENSIVE METABOLIC PANEL - Abnormal; Notable for the following components:   Glucose, Bld 155 (*)    Creatinine, Ser 1.36 (*)    Total Protein 6.4 (*)    GFR calc non Af Amer 49 (*)    GFR calc Af Amer 57 (*)    All other components within normal limits  LIPASE, BLOOD  URINALYSIS, ROUTINE W REFLEX MICROSCOPIC  D-DIMER, QUANTITATIVE (NOT AT [facility])  TROPONIN I (HIGH SENSITIVITY)  TROPONIN I (HIGH SENSITIVITY)    EKG EKG Interpretation  Date/Time:  Friday January 10 2019 18:18:56 EDT Ventricular Rate:  58 PR Interval:  188 QRS Duration: 102 QT Interval:  420 QTC Calculation: 412 R Axis:   67 Text Interpretation:  Sinus bradycardia Otherwise normal ECG Confirmed by Fredia Sorrow [phone]) on 01/10/2019 8:37:08  PM   Radiology Dg Chest 2 View  Result Date: 01/10/2019 CLINICAL DATA:  Chest pain EXAM: CHEST - 2 VIEW COMPARISON:  01/28/2018 FINDINGS: The heart size and mediastinal contours are within normal limits. Both lungs are clear. The visualized skeletal structures are unremarkable. IMPRESSION: No active cardiopulmonary disease. Electronically Signed   By: Donavan Foil M.D.   On: 01/10/2019 18:48   Ct Abdomen Pelvis W Contrast  Result Date: 01/10/2019 CLINICAL DATA:  78 year old male with abdominal pain. Concern for acute diverticulitis. EXAM: CT ABDOMEN AND PELVIS WITH CONTRAST TECHNIQUE: Multidetector CT imaging of the abdomen and pelvis was performed using the standard protocol following bolus administration of intravenous contrast. CONTRAST:  166mL OMNIPAQUE IOHEXOL 300 MG/ML  SOLN COMPARISON:  CT of the abdomen pelvis dated 10/31/2018 FINDINGS: Lower chest: There are bibasilar dependent atelectatic changes. The visualized lung bases are otherwise clear. Atherosclerotic calcification of the aortic valve. No intra-abdominal free air or free fluid. Hepatobiliary: Slight irregularity of the liver contour. There is mild intrahepatic biliary ductal dilatation versus less likely mild periportal edema. There are small stones or sludge ball within the gallbladder. No pericholecystic fluid or evidence of acute cholecystitis by CT. Pancreas: Unremarkable. No pancreatic ductal dilatation or surrounding inflammatory changes. Spleen: Normal in size without focal abnormality. Adrenals/Urinary Tract: The adrenal glands are unremarkable. There is a 4 mm nonobstructing right renal upper pole calculus. A 2.1 cm right renal upper pole hypodense lesion is not well characterized on this CT but appears similar to prior CT. Further evaluation with MRI as previously recommended. There is no hydronephrosis on either side. There is symmetric enhancement and excretion of contrast by both kidneys. The visualized ureters and urinary  bladder appear unremarkable. Stomach/Bowel: There is a 3 cm duodenal diverticulum. No associated inflammatory changes. There is sigmoid diverticulosis as well as scattered colonic diverticula. Mild perisigmoid stranding may be chronic and scarring  or represent mild acute diverticulitis. Clinical correlation is recommended. No diverticular abscess or perforation. There is no bowel obstruction. Appendectomy. Vascular/Lymphatic: Advanced atherosclerotic calcification of the aorta. 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 This is similar to prior CT. The IVC is unremarkable. No portal venous gas. There is no adenopathy. Reproductive: Mildly enlarged prostate gland with median lobe hypertrophy measuring approximately 5 cm in transverse axial diameter. Prostate fiducial markers noted. Other: Small fat containing umbilical hernia.0 Musculoskeletal: Mild degenerative changes of the spine. No acute osseous pathology. IMPRESSION: 1. Colonic diverticulosis. Mild perisigmoid stranding may be chronic or represent mild acute diverticulitis. Clinical correlation is recommended. No diverticular abscess or perforation. 2. Small gallstones.  No evidence of acute cholecystitis by CT. 3. 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 Follow-up as previously recommended. 4. Mildly enlarged prostate gland with median lobe hypertrophy. Aortic Atherosclerosis (ICD10-I70.0). Electronically Signed   By: Anner Crete M.D.   On: 01/10/2019 21:32    Procedures Procedures (including critical care time)  Medications Ordered in ED Medications  0.9 %  sodium chloride infusion ( Intravenous New Bag/Given 01/10/19 2222)  ciprofloxacin (CIPRO) tablet 500 mg (has no administration in time range)  metroNIDAZOLE (FLAGYL) tablet 500 mg (has no administration in time range)  ondansetron (ZOFRAN) injection 4 mg (4 mg Intravenous Given 01/10/19 2052)  HYDROmorphone (DILAUDID) injection 1 mg (1 mg Intravenous  Given 01/10/19 2052)  sodium chloride 0.9 % bolus 500 mL ( Intravenous Stopped 01/10/19 2148)  iohexol (OMNIPAQUE) 300 MG/ML solution 100 mL (100 mLs Intravenous Contrast Given 01/10/19 2101)  HYDROmorphone (DILAUDID) injection 1 mg (1 mg Intravenous Given 01/10/19 2220)     Initial Impression / Assessment and Plan / ED Course  I have reviewed the triage vital signs and the nursing notes.  Pertinent labs & imaging results that were available during my care of the patient were reviewed by me and considered in my medical decision making (see chart for details).        Patient with acute onset of abdominal pain.  Started in the epigastric area.  Suggestive of either an acute perforation from stomach ulcer or an acute biliary attack.  Or perhaps an acute kidney stone.  Patient has had a history of kidney stones in the past but he said this felt different.  Labs without any significant abnormalities no leukocytosis liver function tests normal lipase normal.  Urinalysis negative for infection.  Troponin was negative chest x-ray without any acute findings.  D-dimer not elevated.  CT scan raise to concerns show evidence of gallstones but without any evidence of acute cholecystitis which can make sense that the pain just started at 6 PM this evening.  Also does raise some concern perhaps for some mild diverticulitis.  Patient symptoms more consistent with an acute attack of biliary colic.  Patient given hydromorphone 1 mg x 2.  With some improvement in the pain.  Not completely resolved.  No significant tenderness on the abdomen other than some diffuse tenderness may be a little greater in the right upper quadrant.  D-dimer being negative pretty much rules out dissection.  CT showed no evidence of any kidney stones.  CT scan was done with IV contrast.  Will treat with the antibiotic Cipro and Flagyl perhaps there could be 2 things going on.  Was sent home with pain medicine antinausea medicine referral to  general surgery and precautions to return for any new or worse symptoms.  Final Clinical Impressions(s) /  ED Diagnoses   Final diagnoses:  Generalized abdominal pain  Biliary colic  Diverticulitis    ED Discharge Orders         Ordered    HYDROcodone-acetaminophen (NORCO/VICODIN) 5-325 MG tablet  Every 6 hours PRN     01/10/19 2308    ondansetron (ZOFRAN ODT) 4 MG disintegrating tablet  Every 8 hours PRN     01/10/19 2308    ciprofloxacin (CIPRO) 500 MG tablet  2 times daily     01/10/19 2308    metroNIDAZOLE (FLAGYL) 500 MG tablet  3 times daily     01/10/19 2308           Fredia Sorrow, MD 01/10/19 2315

## 2019-01-10 NOTE — Discharge Instructions (Signed)
On Monday call general surgery for follow-up of the concerns of the pain being secondary to gallstones.  Do CT raising some concerns about diverticulitis take the antibiotic Cipro and Flagyl as directed for the next 7 days.  Take the hydrocodone pain medication as directed.  Take the Zofran as needed for nausea vomiting.  Return for fever return for persistent vomiting return for worse pain or pain not resolving.

## 2019-01-10 NOTE — ED Notes (Signed)
MD informed of patients pain level.

## 2019-01-13 ENCOUNTER — Emergency Department (HOSPITAL_COMMUNITY)
Admission: EM | Admit: 2019-01-13 | Discharge: 2019-01-13 | Disposition: A | Discharge status: Home / Self Care | Payer: Medicare Other | Source: Home / Self Care | Attending: Emergency Medicine | Admitting: Emergency Medicine

## 2019-01-13 ENCOUNTER — Other Ambulatory Visit: Payer: Self-pay

## 2019-01-13 ENCOUNTER — Emergency Department (HOSPITAL_COMMUNITY): Payer: Medicare Other

## 2019-01-13 DIAGNOSIS — K819 Cholecystitis, unspecified: Secondary | ICD-10-CM | POA: Diagnosis not present

## 2019-01-13 DIAGNOSIS — N289 Disorder of kidney and ureter, unspecified: Secondary | ICD-10-CM

## 2019-01-13 DIAGNOSIS — Z87891 Personal history of nicotine dependence: Secondary | ICD-10-CM | POA: Insufficient documentation

## 2019-01-13 DIAGNOSIS — E871 Hypo-osmolality and hyponatremia: Secondary | ICD-10-CM | POA: Insufficient documentation

## 2019-01-13 DIAGNOSIS — I480 Paroxysmal atrial fibrillation: Secondary | ICD-10-CM

## 2019-01-13 DIAGNOSIS — K8000 Calculus of gallbladder with acute cholecystitis without obstruction: Secondary | ICD-10-CM | POA: Diagnosis not present

## 2019-01-13 DIAGNOSIS — Z79899 Other long term (current) drug therapy: Secondary | ICD-10-CM | POA: Insufficient documentation

## 2019-01-13 LAB — CBC WITH DIFFERENTIAL/PLATELET
Abs Immature Granulocytes: 0.19 10*3/uL — ABNORMAL HIGH (ref 0.00–0.07)
Basophils Absolute: 0 10*3/uL (ref 0.0–0.1)
Basophils Relative: 0 %
Eosinophils Absolute: 0 10*3/uL (ref 0.0–0.5)
Eosinophils Relative: 0 %
HCT: 37 % — ABNORMAL LOW (ref 39.0–52.0)
Hemoglobin: 13.1 g/dL (ref 13.0–17.0)
Immature Granulocytes: 1 %
Lymphocytes Relative: 3 %
Lymphs Abs: 0.4 10*3/uL — ABNORMAL LOW (ref 0.7–4.0)
MCH: 31.3 pg (ref 26.0–34.0)
MCHC: 35.4 g/dL (ref 30.0–36.0)
MCV: 88.3 fL (ref 80.0–100.0)
Monocytes Absolute: 1.3 10*3/uL — ABNORMAL HIGH (ref 0.1–1.0)
Monocytes Relative: 9 %
Neutro Abs: 12.4 10*3/uL — ABNORMAL HIGH (ref 1.7–7.7)
Neutrophils Relative %: 87 %
Platelets: 150 10*3/uL (ref 150–400)
RBC: 4.19 MIL/uL — ABNORMAL LOW (ref 4.22–5.81)
RDW: 13.4 % (ref 11.5–15.5)
WBC: 14.2 10*3/uL — ABNORMAL HIGH (ref 4.0–10.5)
nRBC: 0 % (ref 0.0–0.2)

## 2019-01-13 LAB — MAGNESIUM: Magnesium: 2 mg/dL (ref 1.7–2.4)

## 2019-01-13 LAB — COMPREHENSIVE METABOLIC PANEL
ALT: 23 U/L (ref 0–44)
AST: 33 U/L (ref 15–41)
Albumin: 3.3 g/dL — ABNORMAL LOW (ref 3.5–5.0)
Alkaline Phosphatase: 42 U/L (ref 38–126)
Anion gap: 13 (ref 5–15)
BUN: 22 mg/dL (ref 8–23)
CO2: 23 mmol/L (ref 22–32)
Calcium: 8.6 mg/dL — ABNORMAL LOW (ref 8.9–10.3)
Chloride: 93 mmol/L — ABNORMAL LOW (ref 98–111)
Creatinine, Ser: 1.7 mg/dL — ABNORMAL HIGH (ref 0.61–1.24)
GFR calc Af Amer: 43 mL/min — ABNORMAL LOW (ref >60)
GFR calc non Af Amer: 37 mL/min — ABNORMAL LOW (ref >60)
Glucose, Bld: 126 mg/dL — ABNORMAL HIGH (ref 70–99)
Potassium: 4.4 mmol/L (ref 3.5–5.1)
Sodium: 129 mmol/L — ABNORMAL LOW (ref 135–145)
Total Bilirubin: 2 mg/dL — ABNORMAL HIGH (ref 0.3–1.2)
Total Protein: 6.4 g/dL — ABNORMAL LOW (ref 6.5–8.1)

## 2019-01-13 LAB — LIPASE, BLOOD: Lipase: 20 U/L (ref 11–51)

## 2019-01-13 LAB — CBG MONITORING, ED: Glucose-Capillary: 116 mg/dL — ABNORMAL HIGH (ref 70–99)

## 2019-01-13 MED ORDER — APIXABAN 2.5 MG PO TABS: 2.5000 mg | ORAL_TABLET | Freq: Two times a day (BID) | ORAL | 0 refills | Status: DC

## 2019-01-13 MED ORDER — APIXABAN 5 MG PO TABS
5.0000 mg | ORAL_TABLET | Freq: Two times a day (BID) | ORAL | Status: DC
  Administered 2019-01-13: 5 mg via ORAL
  Filled 2019-01-13 (×2): qty 1

## 2019-01-13 NOTE — ED Triage Notes (Signed)
Pt states he just started feeling weak tonight and just not feeling well.  While transporting via EMS, pt was in uncontrolled A-Fib and medics cardioverted pt on the way here.  Pt is now in NSR.  Pt states he feels better, just groggy from the Versed.  CBG is 116

## 2019-01-13 NOTE — ED Notes (Signed)
Patient verbalizes understanding of discharge instructions. Opportunity for questioning and answers were provided. Armband removed by staff, pt discharged from ED. Ambulated out to lobby  

## 2019-01-13 NOTE — ED Provider Notes (Signed)
[facility] EMERGENCY DEPARTMENT Provider Note   CSN: [id number] Arrival date & time: 01/13/19  0124    History   Chief Complaint Chief Complaint  Patient presents with  . Weakness    HPI John Livingston is a 78 y.o. male.   The history is provided by the patient.  Weakness He has history of asthma and GERD and recently was diagnosed with gallstones and had continued to have right upper quadrant pain until this evening when he suddenly noted the pain ceased and he developed extreme weakness.  He denies chest pain, heaviness, tightness, pressure.  Denies any palpitations.  EMS was called and noted low blood pressure and patient in atrial fibrillation with rapid ventricular response.  Because of hypotension and rapid heart rate, he was cardioverted in the ambulance and he states he feels much better.  He was medicated for cardioversion with midazolam.  He continues to be pain-free.  He denies prior history of atrial fibrillation.  Past Medical History:  Diagnosis Date  . Baker's cyst of knee    left  . Cancer of appendix ([place])   . Moderate persistent asthma   . Ureteral stone     Patient Active Problem List   Diagnosis Date Noted  . Moderate persistent asthma with reflux disease 04/18/2012  . Dysphagia 04/18/2012  . GERD (gastroesophageal reflux disease) 04/18/2012    Past Surgical History:  Procedure Laterality Date  . APPENDECTOMY    . cartilage surgery in nose          Home Medications    Prior to Admission medications   Medication Sig Start Date End Date Taking? Authorizing Provider  albuterol (PROVENTIL HFA;VENTOLIN HFA) 108 (90 BASE) MCG/ACT inhaler Inhale 2 puffs into the lungs every 6 (six) hours as needed for wheezing or shortness of breath. 04/18/12   Elsie Stain, MD  amoxicillin-clavulanate (AUGMENTIN) 875-125 MG tablet Take 1 tablet by mouth 2 (two) times daily. One po bid x 7 days 01/28/18   Mesner, Corene Cornea, MD  Aromatic Inhalants  (VICKS VAPOR INHALER IN) Inhale into the lungs at bedtime.    [provider]  benzonatate (TESSALON) 100 MG capsule Take 1 capsule (100 mg total) by mouth 3 (three) times daily as needed for cough. 01/28/18   Mesner, Corene Cornea, MD  ciprofloxacin (CIPRO) 500 MG tablet Take 1 tablet (500 mg total) by mouth 2 (two) times daily. 01/10/19   Fredia Sorrow, MD  clobetasol cream (TEMOVATE) 0.05 % Apply topically. As directed    [provider]  Doxylamine Succinate, Sleep, (SLEEP AID PO) Take 2 tablets by mouth at bedtime.    [provider]  fluticasone (FLONASE) 50 MCG/ACT nasal spray Place 2 sprays into both nostrils daily. 01/28/18   Mesner, Corene Cornea, MD  hydrochlorothiazide (HYDRODIURIL) 25 MG tablet Take 1 tablet (25 mg total) by mouth daily. 04/30/13   Palumbo, April, MD  HYDROcodone-acetaminophen (NORCO/VICODIN) 5-325 MG per tablet Take 1-2 tablets by mouth every 6 (six) hours as needed for moderate pain. 08/02/13   Fredia Sorrow, MD  HYDROcodone-acetaminophen (NORCO/VICODIN) 5-325 MG tablet Take 1 tablet by mouth every 6 (six) hours as needed for moderate pain. 01/10/19   Fredia Sorrow, MD  metroNIDAZOLE (FLAGYL) 500 MG tablet Take 1 tablet (500 mg total) by mouth 3 (three) times daily for 7 days. 01/10/19 01/17/19  Fredia Sorrow, MD  mometasone (ASMANEX 120 METERED DOSES) 220 MCG/INH inhaler Inhale 2 puffs into the lungs daily. 04/18/12   Elsie Stain, MD  mometasone-formoterol (DULERA) 100-5 MCG/ACT AERO Inhale 2 puffs into the lungs 2 (two) times daily as needed for wheezing. 01/28/18   Mesner, Corene Cornea, MD  omeprazole (PRILOSEC) 20 MG capsule Take 1 capsule (20 mg total) by mouth daily. 04/18/12   Elsie Stain, MD  ondansetron (ZOFRAN ODT) 4 MG disintegrating tablet Take 1 tablet (4 mg total) by mouth every 8 (eight) hours as needed. 01/10/19   Fredia Sorrow, MD  oxyCODONE-acetaminophen (PERCOCET) 5-325 MG per tablet Take 1 tablet by mouth every 6 (six) hours  as needed. 04/30/13   Palumbo, April, MD  TRAVATAN Z 0.004 % SOLN ophthalmic solution Place 1 drop into both eyes 2 (two) times daily.    [provider]  vardenafil (LEVITRA) 10 MG tablet Take 10 mg by mouth daily as needed for erectile dysfunction.    [provider]    Family History Family History  Problem Relation Age of Onset  . Asthma Son        as a child  . Heart disease Mother   . Prostate cancer Father     Social History Social History   Tobacco Use  . Smoking status: Former Smoker    Packs/day: 1.00    Years: 20.00    Pack years: 20.00    Types: Cigarettes    Quit date: 03/20/1978    Years since quitting: 40.8  . Smokeless tobacco: Never Used  Substance Use Topics  . Alcohol use: Yes    Comment: occ  . Drug use: No     Allergies   Patient has no known allergies.   Review of Systems Review of Systems  Neurological: Positive for weakness.  All other systems reviewed and are negative.    Physical Exam Updated Vital Signs BP (!) 108/52   Pulse 75   Resp (!) 27   SpO2 95%   Physical Exam Vitals signs and nursing note reviewed.    78 year old male, resting comfortably and in no acute distress. Vital signs are significant for elevated respiratory rate. Oxygen saturation is 94%, which is normal. Head is normocephalic and atraumatic. PERRLA, EOMI. Oropharynx is clear. Neck is nontender and supple without adenopathy or JVD. Back is nontender and there is no CVA tenderness. Lungs are clear without rales, wheezes, or rhonchi. Chest is nontender. Heart has regular rate and rhythm without murmur. Abdomen is soft, flat, nontender without masses or hepatosplenomegaly and peristalsis is normoactive. Extremities have no cyanosis or edema, full range of motion is present. Skin is warm and dry without rash. Neurologic: Mental status is normal, cranial nerves are intact, there are no motor or sensory deficits.  ED Treatments / Results  Labs  (all labs ordered are listed, but only abnormal results are displayed) Labs Reviewed  CBC WITH DIFFERENTIAL/PLATELET - Abnormal; Notable for the following components:      Result Value   WBC 14.2 (*)    RBC 4.19 (*)    HCT 37.0 (*)    Neutro Abs 12.4 (*)    Lymphs Abs 0.4 (*)    Monocytes Absolute 1.3 (*)    Abs Immature Granulocytes 0.19 (*)    All other components within normal limits  COMPREHENSIVE METABOLIC PANEL - Abnormal; Notable for the following components:   Sodium 129 (*)    Chloride 93 (*)    Glucose, Bld 126 (*)    Creatinine, Ser 1.70 (*)    Calcium 8.6 (*)    Total Protein 6.4 (*)    Albumin  3.3 (*)    Total Bilirubin 2.0 (*)    GFR calc non Af Amer 37 (*)    GFR calc Af Amer 43 (*)    All other components within normal limits  CBG MONITORING, ED - Abnormal; Notable for the following components:   Glucose-Capillary 116 (*)    All other components within normal limits  MAGNESIUM  LIPASE, BLOOD    EKG MEGA, BORGEN [id number] 13-Jan-2019 01:35:13 [place] [facility]-NLD ROUTINE RECORD Sinus rhythm Ventricular premature complex When compared with ECG of 01/10/2019, Premature ventricular complexes are now present HEART RATE has increased Confirmed by Delora Fuel ([id number]) on 01/13/2019 1:49:22 AM 86mm/s 49mm/mV 150Hz  9.0.4 CID: [id number] Confirmed By: Delora Fuel Vent. rate 84 BPM PR interval * ms QRS duration 106 ms QT/QTc 381/451 ms P-R-T axes 66 79 44 27-Dec-1940 (78 yr) Male Caucasian Room:ED027 Loc:0 Technician: [phone] Test ind:  Radiology Dg Chest Port 1 View  Result Date: 01/13/2019 CLINICAL DATA:  Weakness EXAM: PORTABLE CHEST 1 VIEW COMPARISON:  01/10/2019 FINDINGS: Cardiac shadow is within normal limits. Aortic calcifications are seen. Mild right basilar atelectasis is noted. No focal confluent infiltrate or effusion is seen. No bony abnormality is noted. IMPRESSION: Right basilar atelectasis. Electronically Signed   By: Inez Catalina  M.D.   On: 01/13/2019 02:08    Procedures Procedures   Medications Ordered in ED Medications - No data to display   Initial Impression / Assessment and Plan / ED Course  I have reviewed the triage vital signs and the nursing notes.  Pertinent labs & imaging results that were available during my care of the patient were reviewed by me and considered in my medical decision making (see chart for details).  Episode of atrial fibrillation with rapid ventricular response complicated by hypotension and treated by EMS with cardioversion.  I reviewed EMS ECGs which clearly show atrial fibrillation with rapid ventricular response.  In the emergency department, ECG shows sinus rhythm.  Blood pressure has been adequate in the ED.  Screening labs are obtained and he will be started on apixaban.  Old records are reviewed confirming recent ED visit for biliary colic.  Surgeon will need to coordinate with cardiology regarding withdrawal of anticoagulation for surgery.  Labs show mild hyponatremia which is not felt to be hemodynamically significant.  Also, mild renal insufficiency is noted which is slightly worse than noted at recent ED visit.  Patient is advised of these findings.  Because of renal insufficiency and age, apixaban dose is reduced.  He is discharged with prescription for apixaban and ambulatory referral to atrial fibrillation clinic and instructions to discuss with his surgeon appropriate timing of his gallbladder surgery.  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    This score determines the patient's risk of having a stroke if the  patient has atrial fibrillation.      Final Clinical Impressions(s) / ED Diagnoses   Final diagnoses:  None    ED Discharge  Orders    None       Delora Fuel, MD [id number] [phone]

## 2019-01-13 NOTE — Discharge Instructions (Signed)
You had an episode of atrial fibrillation today.  This puts you at increased risk of a stroke.  You have been placed on a blood thinner to try to prevent strokes.  Please follow-up with the atrial fibrillation clinic.  Also, your surgeon may want to reschedule your gallbladder surgery because of this episode today.  He will need to coordinate with your cardiologist when to have the surgery and how to deal with taking the blood thinner.

## 2019-01-14 ENCOUNTER — Encounter (HOSPITAL_COMMUNITY): Payer: Self-pay | Admitting: Nurse Practitioner

## 2019-01-14 ENCOUNTER — Ambulatory Visit (HOSPITAL_BASED_OUTPATIENT_CLINIC_OR_DEPARTMENT_OTHER)
Admission: RE | Admit: 2019-01-14 | Discharge: 2019-01-14 | Disposition: A | Discharge status: Home / Self Care | Payer: Medicare Other | Source: Ambulatory Visit | Attending: Physician Assistant | Admitting: Physician Assistant

## 2019-01-14 ENCOUNTER — Other Ambulatory Visit: Payer: Self-pay

## 2019-01-14 VITALS — BP 124/62 | HR 80 | Ht 69.0 in | Wt 182.6 lb

## 2019-01-14 DIAGNOSIS — R1011 Right upper quadrant pain: Secondary | ICD-10-CM | POA: Insufficient documentation

## 2019-01-14 DIAGNOSIS — Z87891 Personal history of nicotine dependence: Secondary | ICD-10-CM | POA: Insufficient documentation

## 2019-01-14 DIAGNOSIS — I4891 Unspecified atrial fibrillation: Secondary | ICD-10-CM | POA: Insufficient documentation

## 2019-01-14 DIAGNOSIS — Z79899 Other long term (current) drug therapy: Secondary | ICD-10-CM | POA: Insufficient documentation

## 2019-01-14 DIAGNOSIS — Z7901 Long term (current) use of anticoagulants: Secondary | ICD-10-CM | POA: Insufficient documentation

## 2019-01-14 DIAGNOSIS — K219 Gastro-esophageal reflux disease without esophagitis: Secondary | ICD-10-CM | POA: Insufficient documentation

## 2019-01-14 DIAGNOSIS — J45909 Unspecified asthma, uncomplicated: Secondary | ICD-10-CM | POA: Insufficient documentation

## 2019-01-15 ENCOUNTER — Encounter (HOSPITAL_COMMUNITY): Payer: Self-pay

## 2019-01-15 ENCOUNTER — Other Ambulatory Visit: Payer: Self-pay

## 2019-01-15 ENCOUNTER — Other Ambulatory Visit: Payer: Self-pay | Admitting: Surgery

## 2019-01-15 ENCOUNTER — Inpatient Hospital Stay (HOSPITAL_COMMUNITY)
Admission: EM | Admit: 2019-01-15 | Discharge: 2019-01-18 | DRG: 445 | Disposition: A | Discharge status: Home Health | Payer: Medicare Other | Attending: Internal Medicine | Admitting: Internal Medicine

## 2019-01-15 ENCOUNTER — Emergency Department (HOSPITAL_COMMUNITY): Payer: Medicare Other

## 2019-01-15 ENCOUNTER — Encounter (HOSPITAL_COMMUNITY): Payer: Self-pay | Admitting: Nurse Practitioner

## 2019-01-15 DIAGNOSIS — Z8042 Family history of malignant neoplasm of prostate: Secondary | ICD-10-CM | POA: Diagnosis not present

## 2019-01-15 DIAGNOSIS — Z20828 Contact with and (suspected) exposure to other viral communicable diseases: Secondary | ICD-10-CM | POA: Diagnosis present

## 2019-01-15 DIAGNOSIS — I959 Hypotension, unspecified: Secondary | ICD-10-CM | POA: Diagnosis present

## 2019-01-15 DIAGNOSIS — I77811 Abdominal aortic ectasia: Secondary | ICD-10-CM | POA: Diagnosis present

## 2019-01-15 DIAGNOSIS — Z7951 Long term (current) use of inhaled steroids: Secondary | ICD-10-CM

## 2019-01-15 DIAGNOSIS — I480 Paroxysmal atrial fibrillation: Secondary | ICD-10-CM | POA: Diagnosis present

## 2019-01-15 DIAGNOSIS — K810 Acute cholecystitis: Secondary | ICD-10-CM | POA: Diagnosis present

## 2019-01-15 DIAGNOSIS — Z79899 Other long term (current) drug therapy: Secondary | ICD-10-CM

## 2019-01-15 DIAGNOSIS — N179 Acute kidney failure, unspecified: Secondary | ICD-10-CM | POA: Diagnosis present

## 2019-01-15 DIAGNOSIS — Z87891 Personal history of nicotine dependence: Secondary | ICD-10-CM

## 2019-01-15 DIAGNOSIS — K219 Gastro-esophageal reflux disease without esophagitis: Secondary | ICD-10-CM | POA: Diagnosis present

## 2019-01-15 DIAGNOSIS — K5750 Diverticulosis of both small and large intestine without perforation or abscess without bleeding: Secondary | ICD-10-CM | POA: Diagnosis present

## 2019-01-15 DIAGNOSIS — Z7901 Long term (current) use of anticoagulants: Secondary | ICD-10-CM

## 2019-01-15 DIAGNOSIS — Z01810 Encounter for preprocedural cardiovascular examination: Secondary | ICD-10-CM

## 2019-01-15 DIAGNOSIS — J4520 Mild intermittent asthma, uncomplicated: Secondary | ICD-10-CM | POA: Diagnosis present

## 2019-01-15 DIAGNOSIS — Z9221 Personal history of antineoplastic chemotherapy: Secondary | ICD-10-CM

## 2019-01-15 DIAGNOSIS — K819 Cholecystitis, unspecified: Secondary | ICD-10-CM | POA: Diagnosis present

## 2019-01-15 DIAGNOSIS — Z825 Family history of asthma and other chronic lower respiratory diseases: Secondary | ICD-10-CM

## 2019-01-15 DIAGNOSIS — C61 Malignant neoplasm of prostate: Secondary | ICD-10-CM | POA: Diagnosis present

## 2019-01-15 DIAGNOSIS — R1011 Right upper quadrant pain: Secondary | ICD-10-CM

## 2019-01-15 DIAGNOSIS — Z8249 Family history of ischemic heart disease and other diseases of the circulatory system: Secondary | ICD-10-CM

## 2019-01-15 DIAGNOSIS — E876 Hypokalemia: Secondary | ICD-10-CM | POA: Diagnosis present

## 2019-01-15 DIAGNOSIS — M7120 Synovial cyst of popliteal space [Baker], unspecified knee: Secondary | ICD-10-CM | POA: Diagnosis present

## 2019-01-15 DIAGNOSIS — Z85038 Personal history of other malignant neoplasm of large intestine: Secondary | ICD-10-CM | POA: Diagnosis not present

## 2019-01-15 DIAGNOSIS — I10 Essential (primary) hypertension: Secondary | ICD-10-CM | POA: Diagnosis present

## 2019-01-15 DIAGNOSIS — K8000 Calculus of gallbladder with acute cholecystitis without obstruction: Principal | ICD-10-CM | POA: Diagnosis present

## 2019-01-15 HISTORY — DX: Malignant neoplasm of prostate: C61

## 2019-01-15 LAB — COMPREHENSIVE METABOLIC PANEL
ALT: 14 U/L (ref 0–44)
AST: 15 U/L (ref 15–41)
Albumin: 2.9 g/dL — ABNORMAL LOW (ref 3.5–5.0)
Alkaline Phosphatase: 58 U/L (ref 38–126)
Anion gap: 8 (ref 5–15)
BUN: 19 mg/dL (ref 8–23)
CO2: 25 mmol/L (ref 22–32)
Calcium: 8.7 mg/dL — ABNORMAL LOW (ref 8.9–10.3)
Chloride: 101 mmol/L (ref 98–111)
Creatinine, Ser: 1.46 mg/dL — ABNORMAL HIGH (ref 0.61–1.24)
GFR calc Af Amer: 52 mL/min — ABNORMAL LOW (ref >60)
GFR calc non Af Amer: 45 mL/min — ABNORMAL LOW (ref >60)
Glucose, Bld: 127 mg/dL — ABNORMAL HIGH (ref 70–99)
Potassium: 3.4 mmol/L — ABNORMAL LOW (ref 3.5–5.1)
Sodium: 134 mmol/L — ABNORMAL LOW (ref 135–145)
Total Bilirubin: 0.8 mg/dL (ref 0.3–1.2)
Total Protein: 6.3 g/dL — ABNORMAL LOW (ref 6.5–8.1)

## 2019-01-15 LAB — CBC WITH DIFFERENTIAL/PLATELET
Abs Immature Granulocytes: 0.09 10*3/uL — ABNORMAL HIGH (ref 0.00–0.07)
Basophils Absolute: 0.1 10*3/uL (ref 0.0–0.1)
Basophils Relative: 1 %
Eosinophils Absolute: 0.1 10*3/uL (ref 0.0–0.5)
Eosinophils Relative: 1 %
HCT: 33.7 % — ABNORMAL LOW (ref 39.0–52.0)
Hemoglobin: 11.9 g/dL — ABNORMAL LOW (ref 13.0–17.0)
Immature Granulocytes: 1 %
Lymphocytes Relative: 6 %
Lymphs Abs: 0.6 10*3/uL — ABNORMAL LOW (ref 0.7–4.0)
MCH: 31.2 pg (ref 26.0–34.0)
MCHC: 35.3 g/dL (ref 30.0–36.0)
MCV: 88.5 fL (ref 80.0–100.0)
Monocytes Absolute: 1.5 10*3/uL — ABNORMAL HIGH (ref 0.1–1.0)
Monocytes Relative: 15 %
Neutro Abs: 7.8 10*3/uL — ABNORMAL HIGH (ref 1.7–7.7)
Neutrophils Relative %: 76 %
Platelets: 207 10*3/uL (ref 150–400)
RBC: 3.81 MIL/uL — ABNORMAL LOW (ref 4.22–5.81)
RDW: 13.7 % (ref 11.5–15.5)
WBC: 10.1 10*3/uL (ref 4.0–10.5)
nRBC: 0 % (ref 0.0–0.2)

## 2019-01-15 LAB — PHOSPHORUS: Phosphorus: 3.6 mg/dL (ref 2.5–4.6)

## 2019-01-15 LAB — MAGNESIUM: Magnesium: 2.1 mg/dL (ref 1.7–2.4)

## 2019-01-15 LAB — LACTIC ACID, PLASMA: Lactic Acid, Venous: 1 mmol/L (ref 0.5–1.9)

## 2019-01-15 LAB — LIPASE, BLOOD: Lipase: 28 U/L (ref 11–51)

## 2019-01-15 LAB — SARS CORONAVIRUS 2 BY RT PCR (HOSPITAL ORDER, PERFORMED IN ~~LOC~~ HOSPITAL LAB): SARS Coronavirus 2: NEGATIVE

## 2019-01-15 MED ORDER — ACETAMINOPHEN 650 MG RE SUPP: 650.0000 mg | Freq: Four times a day (QID) | RECTAL | Status: DC | PRN

## 2019-01-15 MED ORDER — MORPHINE SULFATE (PF) 2 MG/ML IV SOLN
2.0000 mg | INTRAVENOUS | Status: DC | PRN
  Administered 2019-01-15 – 2019-01-18 (×9): 2 mg via INTRAVENOUS
  Filled 2019-01-15 (×9): qty 1

## 2019-01-15 MED ORDER — PIPERACILLIN-TAZOBACTAM 3.375 G IVPB 30 MIN: 3.3750 g | Freq: Once | INTRAVENOUS | Status: DC

## 2019-01-15 MED ORDER — PANTOPRAZOLE SODIUM 40 MG PO TBEC
40.0000 mg | DELAYED_RELEASE_TABLET | Freq: Every day | ORAL | Status: DC
  Administered 2019-01-15 – 2019-01-18 (×4): 40 mg via ORAL
  Filled 2019-01-15 (×4): qty 1

## 2019-01-15 MED ORDER — SODIUM CHLORIDE 0.9 % IV SOLN
1.0000 g | Freq: Once | INTRAVENOUS | Status: DC
  Filled 2019-01-15: qty 10

## 2019-01-15 MED ORDER — PIPERACILLIN-TAZOBACTAM 3.375 G IVPB 30 MIN
3.3750 g | Freq: Once | INTRAVENOUS | Status: AC
  Administered 2019-01-15: 3.375 g via INTRAVENOUS
  Filled 2019-01-15: qty 50

## 2019-01-15 MED ORDER — TAMSULOSIN HCL 0.4 MG PO CAPS
0.4000 mg | ORAL_CAPSULE | Freq: Every day | ORAL | Status: DC
  Administered 2019-01-15 – 2019-01-17 (×3): 0.4 mg via ORAL
  Filled 2019-01-15 (×3): qty 1

## 2019-01-15 MED ORDER — ACETAMINOPHEN 325 MG PO TABS: 650.0000 mg | ORAL_TABLET | Freq: Four times a day (QID) | ORAL | Status: DC | PRN

## 2019-01-15 MED ORDER — ALBUTEROL SULFATE (2.5 MG/3ML) 0.083% IN NEBU: 3.0000 mL | INHALATION_SOLUTION | Freq: Four times a day (QID) | RESPIRATORY_TRACT | Status: DC | PRN

## 2019-01-15 MED ORDER — ONDANSETRON HCL 4 MG/2ML IJ SOLN
4.0000 mg | Freq: Four times a day (QID) | INTRAMUSCULAR | Status: DC | PRN
  Administered 2019-01-15: 4 mg via INTRAVENOUS
  Filled 2019-01-15: qty 2

## 2019-01-15 MED ORDER — LABETALOL HCL 5 MG/ML IV SOLN: 10.0000 mg | Freq: Four times a day (QID) | INTRAVENOUS | Status: DC | PRN

## 2019-01-15 MED ORDER — ONDANSETRON HCL 4 MG PO TABS: 4.0000 mg | ORAL_TABLET | Freq: Four times a day (QID) | ORAL | Status: DC | PRN

## 2019-01-15 MED ORDER — POTASSIUM CHLORIDE IN NACL 20-0.9 MEQ/L-% IV SOLN
INTRAVENOUS | Status: DC
  Administered 2019-01-15 – 2019-01-17 (×5): via INTRAVENOUS
  Filled 2019-01-15 (×7): qty 1000

## 2019-01-15 NOTE — H&P (Addendum)
History and Physical    John Livingston [id number] DOB: February 08, 1941 DOA: 01/15/2019  PCP: Milana Na., MD  Patient coming from: General surgery's office  I have personally briefly reviewed patient's old medical records in [facility]  Chief Complaint: Right upper quadrant pain since 6 days  HPI: John Livingston is a 78 y.o. male with medical history significant of prostate cancer-currently on radiation therapy, GERD, well-controlled asthma, new onset A. Fib-on Eliquis presents to emergency department due to worsening right upper quadrant pain since 6 days.  Reports pain is 10 out of 10, sometimes radiates to his back, aggravates with movements and eating food, relieves with nothing, denies association with nausea, vomiting, melena, hematemesis, fever, chills, pruritus, change in skin or urine color.  Had bowel movement 6 days ago.  His appetite is somewhat decreased due to pain however denies weight loss, night sweats, over-the-counter use of NSAIDs.  Patient evaluated in ED last week with similar symptoms and CT abdomen showed: Colonic diverticulosis, small gallstones, and mild intrahepatic biliary ductal dilatation and was discharged home on Cipro and Flagyl.   On 01/13/2019 patient was very weak and having heart palpitation-family called 911 and patient was found to be in A. fib with RVR with hypotension.  Patient was cardioverted in route to the emergency department.  Patient went back to sinus rhythm and was feeling much better upon arrival to the ER.  Patient started on Eliquis and discharged home in stable condition.  He evaluated by cardiology yesterday.  He took Eliquis last night.  He has been taking hydrocodone for right upper quadrant pain/epigastric pain with no help at all.  He has been taking his Cipro and Flagyl as prescribed and finish the course yesterday.  He was seen by Dr. Ninfa Linden this morning and due to constant abdominal pain he was advised to go to the ER for  further evaluation and management.  He lives with his wife and denies smoking, alcohol, illicit drug use.  He is getting radiation therapy 5 days in a week from M-F.  His last radiation therapy was on last Friday.  ED Course: Upon arrival: Patient's vital signs stable.  Potassium 3.4, lactic acid: WNL.  Right upper quadrant ultrasound: Revealed acute cholecystitis.  Patient received IV Zosyn.  EDP consulted general surgery who recommended to keep patient n.p.o.  Review of Systems: As per HPI otherwise negative.    Past Medical History:  Diagnosis Date  . Baker's cyst of knee    left  . Cancer of appendix ([place])   . Moderate persistent asthma   . Ureteral stone     Past Surgical History:  Procedure Laterality Date  . APPENDECTOMY    . cartilage surgery in nose       reports that he quit smoking about 40 years ago. His smoking use included cigarettes. He has a 20.00 pack-year smoking history. He has never used smokeless tobacco. He reports current alcohol use of about 1.0 standard drinks of alcohol per week. He reports that he does not use drugs.  No Known Allergies  Family History  Problem Relation Age of Onset  . Asthma Son        as a child  . Heart disease Mother   . Prostate cancer Father     Prior to Admission medications   Medication Sig Start Date End Date Taking? Authorizing Provider  albuterol (PROVENTIL HFA;VENTOLIN HFA) 108 (90 BASE) MCG/ACT inhaler Inhale 2 puffs into the lungs every 6 (six)  hours as needed for wheezing or shortness of breath. 04/18/12   Elsie Stain, MD  amLODipine (NORVASC) 5 MG tablet Take by mouth.    [provider]  apixaban (ELIQUIS) 2.5 MG TABS tablet Take 1 tablet (2.5 mg total) by mouth 2 (two) times daily. [id number]  Delora Fuel, MD  ciprofloxacin (CIPRO) 500 MG tablet Take 1 tablet (500 mg total) by mouth 2 (two) times daily. 01/10/19   Fredia Sorrow, MD  clobetasol cream (TEMOVATE) 0.05 % Apply topically. As  directed    [provider]  Doxylamine Succinate, Sleep, (SLEEP AID PO) Take 2 tablets by mouth at bedtime.    [provider]  fluticasone (FLONASE) 50 MCG/ACT nasal spray Place 2 sprays into both nostrils daily. 01/28/18   Mesner, Corene Cornea, MD  hydrochlorothiazide (HYDRODIURIL) 25 MG tablet Take 1 tablet (25 mg total) by mouth daily. 04/30/13   Palumbo, April, MD  HYDROcodone-acetaminophen (NORCO/VICODIN) 5-325 MG tablet Take 1 tablet by mouth every 6 (six) hours as needed for moderate pain. 01/10/19   Fredia Sorrow, MD  metroNIDAZOLE (FLAGYL) 500 MG tablet Take 1 tablet (500 mg total) by mouth 3 (three) times daily for 7 days. 01/10/19 01/17/19  Fredia Sorrow, MD  mometasone (ASMANEX 120 METERED DOSES) 220 MCG/INH inhaler Inhale 2 puffs into the lungs daily. 04/18/12   Elsie Stain, MD  mometasone-formoterol (DULERA) 100-5 MCG/ACT AERO Inhale 2 puffs into the lungs 2 (two) times daily as needed for wheezing. 01/28/18   Mesner, Corene Cornea, MD  omeprazole (PRILOSEC) 20 MG capsule Take 1 capsule (20 mg total) by mouth daily. 04/18/12   Elsie Stain, MD  ondansetron (ZOFRAN ODT) 4 MG disintegrating tablet Take 1 tablet (4 mg total) by mouth every 8 (eight) hours as needed. 01/10/19   Fredia Sorrow, MD  oxyCODONE-acetaminophen (PERCOCET) 5-325 MG per tablet Take 1 tablet by mouth every 6 (six) hours as needed. 04/30/13   Palumbo, April, MD  tamsulosin (FLOMAX) 0.4 MG CAPS capsule Take 0.4 mg by mouth.    [provider]  TRAVATAN Z 0.004 % SOLN ophthalmic solution Place 1 drop into both eyes 2 (two) times daily.    [provider]  vardenafil (LEVITRA) 10 MG tablet Take 10 mg by mouth daily as needed for erectile dysfunction.    [provider]    Physical Exam: Vitals:   01/15/19 1145 01/15/19 1200 01/15/19 1215 01/15/19 1345  BP: 99/68 (!) 116/59 120/62   Pulse: 72 71 77 72  Resp: 18 15 16 16   Temp:    99.4 F (37.4 C)  TempSrc:    Oral   SpO2: 93% 92% 93% 94%    Constitutional: NAD, calm, comfortable Vitals:   01/15/19 1145 01/15/19 1200 01/15/19 1215 01/15/19 1345  BP: 99/68 (!) 116/59 120/62   Pulse: 72 71 77 72  Resp: 18 15 16 16   Temp:    99.4 F (37.4 C)  TempSrc:    Oral  SpO2: 93% 92% 93% 94%   Constitutional: Alert and oriented x3, not in acute distress, communicating well.   Eyes: PERRL, lids and conjunctivae normal ENMT: Mucous membranes are moist. Posterior pharynx clear of any exudate or lesions.Normal dentition.  Neck: normal, supple, no masses, no thyromegaly Respiratory: clear to auscultation bilaterally, no wheezing, no crackles. Normal respiratory effort. No accessory muscle use.  Cardiovascular: Regular rate and rhythm, no murmurs / rubs / gallops. No extremity edema. 2+ pedal pulses. No carotid bruits.  Abdomen: Right upper quadrant tenderness  positive, no guarding, no rigidity, no masses palpated. No hepatosplenomegaly. Bowel sounds positive.  Musculoskeletal: no clubbing / cyanosis. No joint deformity upper and lower extremities. Good ROM, no contractures. Normal muscle tone.  Skin: no rashes, lesions, ulcers. No induration Neurologic: CN 2-12 grossly intact. Sensation intact, DTR normal. Strength 5/5 in all 4.  Psychiatric: Normal judgment and insight. Alert and oriented x 3. Normal mood.    Labs on Admission: I have personally reviewed following labs and imaging studies  CBC: Recent Labs  Lab 01/10/19 1826 01/13/19 0220 01/15/19 1101  WBC 5.0 14.2* 10.1  NEUTROABS  --  12.4* 7.8*  HGB 12.5* 13.1 11.9*  HCT 36.4* 37.0* 33.7*  MCV 89.4 88.3 88.5  PLT 189 150 [id number]   Basic Metabolic Panel: Recent Labs  Lab 01/10/19 1825 01/13/19 0220 01/15/19 1101  NA 137 129* 134*  K 3.8 4.4 3.4*  CL 103 93* 101  CO2 24 23 25   GLUCOSE 155* 126* 127*  BUN 18 22 19   CREATININE 1.36* 1.70* 1.46*  CALCIUM 9.1 8.6* 8.7*  MG  --  2.0  --    GFR: Estimated Creatinine Clearance: 40.4 mL/min (A)  (by C-G formula based on SCr of 1.46 mg/dL (H)). Liver Function Tests: Recent Labs  Lab 01/10/19 1825 01/13/19 0220 01/15/19 1101  AST 21 33 15  ALT 15 23 14   ALKPHOS 54 42 58  BILITOT 0.7 2.0* 0.8  PROT 6.4* 6.4* 6.3*  ALBUMIN 4.1 3.3* 2.9*   Recent Labs  Lab 01/10/19 1826 01/13/19 0220  LIPASE 49 20   No results for input(s): AMMONIA in the last 168 hours. Coagulation Profile: No results for input(s): INR, PROTIME in the last 168 hours. Cardiac Enzymes: No results for input(s): CKTOTAL, CKMB, CKMBINDEX, TROPONINI in the last 168 hours. BNP (last 3 results) No results for input(s): PROBNP in the last 8760 hours. HbA1C: No results for input(s): HGBA1C in the last 72 hours. CBG: Recent Labs  Lab 01/13/19 0137  GLUCAP 116*   Lipid Profile: No results for input(s): CHOL, HDL, LDLCALC, TRIG, CHOLHDL, LDLDIRECT in the last 72 hours. Thyroid Function Tests: No results for input(s): TSH, T4TOTAL, FREET4, T3FREE, THYROIDAB in the last 72 hours. Anemia Panel: No results for input(s): VITAMINB12, FOLATE, FERRITIN, TIBC, IRON, RETICCTPCT in the last 72 hours. Urine analysis:    Component Value Date/Time   COLORURINE YELLOW 01/10/2019 1846   APPEARANCEUR CLEAR 01/10/2019 1846   LABSPEC 1.025 01/10/2019 1846   PHURINE 5.5 01/10/2019 1846   GLUCOSEU NEGATIVE 01/10/2019 1846   HGBUR NEGATIVE 01/10/2019 1846   BILIRUBINUR NEGATIVE 01/10/2019 1846   KETONESUR NEGATIVE 01/10/2019 1846   PROTEINUR NEGATIVE 01/10/2019 1846   UROBILINOGEN 0.2 08/02/2013 1122   NITRITE NEGATIVE 01/10/2019 1846   LEUKOCYTESUR NEGATIVE 01/10/2019 1846    Radiological Exams on Admission: US Abdomen Limited Ruq  Result Date: 01/15/2019 CLINICAL DATA:  Right upper quadrant pain EXAM: ULTRASOUND ABDOMEN LIMITED RIGHT UPPER QUADRANT COMPARISON:  None. FINDINGS: Gallbladder: The gallbladder is partially decompressed with apparent wall thickening measuring up to 10 mm. There is a positive sonographic  Murphy sign and small amount of pericholecystic fluid. Layering hyperechoic sludge is seen. There is also a echogenic shadowing calculi measuring 1.4 cm. Common bile duct: Diameter: 4.3 mm Liver: No focal lesion identified. Within normal limits in parenchymal echogenicity. Portal vein is patent on color Doppler imaging with normal direction of blood flow towards the liver. Other: None. IMPRESSION: Findings which could be suggestive of acute cholecystitis. Electronically Signed  By: Prudencio Pair M.D.   On: 01/15/2019 13:00    Assessment/Plan Principal Problem:   Acute cholecystitis Active Problems:   AF (paroxysmal atrial fibrillation) (H[facility])   Prostate cancer ([facility])   Mild intermittent asthma   Hypertension   AKI (acute kidney injury) ([place])   Hypokalemia   Acute cholecystitis: -Right upper quadrant ultrasound: as above.  No leukocytosis, patient is afebrile, LFT: WNL. -Admit patient for close monitoring. -Start on IV fluids.  Received IV Zosyn in the ED- will continue same. -We will get blood culture. -We will keep him n.p.o.  Zofran as needed for nausea and vomiting.  Morphine as needed for severe pain. -EDP consulted to general surgery-await recommendations. -Hold Eliquis.  Monitor vitals and H&H closely.  New onset A. Fib: -Status post cardioversion on 01/13/2019 -On telemetry.  We will get the EKG.  Patient is currently in sinus rhythm. -Hold Eliquis for now.  Monitor heart rate very closely.  Hypertension: -We will hold amlodipine and HCTZ for now due to n.p.o. status -Currently blood pressure is well controlled.  Labetalol as needed for hypertension. -Monitor blood pressure closely.  Hypokalemia: Potassium 3.4. -Replenished. -Repeat BMP tomorrow a.m.  AKI: Improving -Continue IV fluids and avoid nephrotoxic medications. -Repeat BMP tomorrow a.m.  Prostate cancer: Currently on radiation therapy -Last radiation therapy: 01/10/2019.  Mild intermittent asthma: -Patient  rarely uses albuterol inhaler at home. -Currently maintaining oxygen saturation on room air. -Continue albuterol as needed for shortness of breath and wheezing.   DVT prophylaxis: TED/SCD-hold Eliquis for now.   Code Status: Full code  family Communication: Wife present at bedside.  Plan of care discussed with patient and his wife in length and they verbalized understanding and agreed with it. Disposition Plan: TBD Consults called: General surgery by EDP Admission status: Inpatient  Mckinley Jewel MD [facility] Pager [phone]  If 7PM-7AM, please contact night-coverage [email or web address] Password [email or web address]  01/15/2019, 2:00 PM

## 2019-01-15 NOTE — ED Triage Notes (Signed)
Pt endorses being sent here by dr to have gallbladder removed. Abd pain with n/v x 6 days. Was evaluated by cardiology yesterday and was told he is okay for surgery. VSS.

## 2019-01-15 NOTE — ED Notes (Signed)
Patient transported to Ultrasound 

## 2019-01-15 NOTE — Progress Notes (Signed)
Primary Care Physician: Puschinsky, Fransico Him., MD Referring Physician: F/u [facility]   John Livingston is a 78 y.o. male with a h/o prostrate CA, HYN.  GERD, asthma that has been in the ER x 2 recently. The first time was with generalized abdominal pain and was found to gallstones and is pending appointment with a surgeon tomorrow. On Monday  He developed extreme weakness and BP dropped per wife around 35 systolic. EMS was called and found to have afib with RVR. Because of the RVR and hypotension, he was cardioverted in the EMS  truck . He immediately felt better. He was started on apixaban and d/c home.   In the clinic today, he is in [place]. He is uncomfortable with RUQ abdominal pain which his main concern today. He denies alcohol, excessive caffeine, no tobacco or significant snoring. He is anxious to see surgeon tomorrow and get some relief.  Today, he denies symptoms of palpitations, chest pain, shortness of breath, orthopnea, PND, lower extremity edema, dizziness, presyncope, syncope, or neurologic sequela.+ RUQ pain. The patient is tolerating medications without difficulties and is otherwise without complaint today.   Past Medical History:  Diagnosis Date  . Baker's cyst of knee    left  . Cancer of appendix ([place])   . Moderate persistent asthma   . Ureteral stone    Past Surgical History:  Procedure Laterality Date  . APPENDECTOMY    . cartilage surgery in nose      Current Outpatient Medications  Medication Sig Dispense Refill  . albuterol (PROVENTIL HFA;VENTOLIN HFA) 108 (90 BASE) MCG/ACT inhaler Inhale 2 puffs into the lungs every 6 (six) hours as needed for wheezing or shortness of breath. 1 Inhaler 6  . amLODipine (NORVASC) 5 MG tablet Take by mouth.    Marland Kitchen apixaban (ELIQUIS) 2.5 MG TABS tablet Take 1 tablet (2.5 mg total) by mouth 2 (two) times daily. 60 tablet 0  . ciprofloxacin (CIPRO) 500 MG tablet Take 1 tablet (500 mg total) by mouth 2 (two) times daily. 14 tablet 0  .  clobetasol cream (TEMOVATE) 0.05 % Apply topically. As directed    . Doxylamine Succinate, Sleep, (SLEEP AID PO) Take 2 tablets by mouth at bedtime.    . fluticasone (FLONASE) 50 MCG/ACT nasal spray Place 2 sprays into both nostrils daily. 16 g 0  . hydrochlorothiazide (HYDRODIURIL) 25 MG tablet Take 1 tablet (25 mg total) by mouth daily. 30 tablet 0  . HYDROcodone-acetaminophen (NORCO/VICODIN) 5-325 MG tablet Take 1 tablet by mouth every 6 (six) hours as needed for moderate pain. 14 tablet 0  . metroNIDAZOLE (FLAGYL) 500 MG tablet Take 1 tablet (500 mg total) by mouth 3 (three) times daily for 7 days. 21 tablet 0  . mometasone (ASMANEX 120 METERED DOSES) 220 MCG/INH inhaler Inhale 2 puffs into the lungs daily. 1 Inhaler 12  . mometasone-formoterol (DULERA) 100-5 MCG/ACT AERO Inhale 2 puffs into the lungs 2 (two) times daily as needed for wheezing. 1 Inhaler 0  . omeprazole (PRILOSEC) 20 MG capsule Take 1 capsule (20 mg total) by mouth daily. 30 capsule 4  . ondansetron (ZOFRAN ODT) 4 MG disintegrating tablet Take 1 tablet (4 mg total) by mouth every 8 (eight) hours as needed. 10 tablet 1  . oxyCODONE-acetaminophen (PERCOCET) 5-325 MG per tablet Take 1 tablet by mouth every 6 (six) hours as needed. 10 tablet 0  . tamsulosin (FLOMAX) 0.4 MG CAPS capsule Take 0.4 mg by mouth.    Dorette Grate Z  0.004 % SOLN ophthalmic solution Place 1 drop into both eyes 2 (two) times daily.    . vardenafil (LEVITRA) 10 MG tablet Take 10 mg by mouth daily as needed for erectile dysfunction.     No current facility-administered medications for this encounter.     No Known Allergies  Social History   Socioeconomic History  . Marital status: Single    Spouse name: Not on file  . Number of children: Not on file  . Years of education: Not on file  . Highest education level: Not on file  Occupational History  . Occupation: IT trainer    Comment: [employer]  Social Needs  . Financial resource strain: Not  on file  . Food insecurity    Worry: Not on file    Inability: Not on file  . Transportation needs    Medical: Not on file    Non-medical: Not on file  Tobacco Use  . Smoking status: Former Smoker    Packs/day: 1.00    Years: 20.00    Pack years: 20.00    Types: Cigarettes    Quit date: 03/20/1978    Years since quitting: 40.8  . Smokeless tobacco: Never Used  Substance and Sexual Activity  . Alcohol use: Yes    Alcohol/week: 1.0 standard drinks    Types: 1 Cans of beer per week    Comment: occ  . Drug use: No  . Sexual activity: Not on file  Lifestyle  . Physical activity    Days per week: Not on file    Minutes per session: Not on file  . Stress: Not on file  Relationships  . Social Herbalist on phone: Not on file    Gets together: Not on file    Attends religious service: Not on file    Active member of club or organization: Not on file    Attends meetings of clubs or organizations: Not on file    Relationship status: Not on file  . Intimate partner violence    Fear of current or ex partner: Not on file    Emotionally abused: Not on file    Physically abused: Not on file    Forced sexual activity: Not on file  Other Topics Concern  . Not on file  Social History Narrative  . Not on file    Family History  Problem Relation Age of Onset  . Asthma Son        as a child  . Heart disease Mother   . Prostate cancer Father     ROS- All systems are reviewed and negative except as per the HPI above  Physical Exam: Vitals:   01/14/19 1412  BP: 124/62  Pulse: 80  Weight: 82.8 kg  Height: 5\' 9"  (1.753 m)   Wt Readings from Last 3 Encounters:  01/14/19 82.8 kg  01/10/19 81.6 kg  01/28/18 84.8 kg    Labs: Lab Results  Component Value Date   NA 129 (L) 01/13/2019   K 4.4 01/13/2019   CL 93 (L) 01/13/2019   CO2 23 01/13/2019   GLUCOSE 126 (H) 01/13/2019   BUN 22 01/13/2019   CREATININE 1.70 (H) 01/13/2019   CALCIUM 8.6 (L) 01/13/2019   MG  2.0 01/13/2019   No results found for: INR No results found for: CHOL, HDL, LDLCALC, TRIG   GEN- The patient is well appearing, alert and oriented x 3 today.   Head- normocephalic, atraumatic Eyes-  Sclera clear, conjunctiva pink Ears- hearing intact Oropharynx- clear Neck- supple, no JVP Lymph- no cervical lymphadenopathy Lungs- Clear to ausculation bilaterally, normal work of breathing Heart- Regular rate and rhythm, no murmurs, rubs or gallops, PMI not laterally displaced GI- soft, NT, ND, + BS Extremities- no clubbing, cyanosis, or edema MS- no significant deformity or atrophy Skin- no rash or lesion Psych- euthymic mood, full affect Neuro- strength and sensation are intact  EKG- NSR, normal EKG, PR int 164 ms, qrs int 98, qtc 440 ms Epic records reviewed    Assessment and Plan: 1. New onset afib Successfully cardioverted  Trigger  may be stress of recent dx of gallstones/RUQ pain  and possibly pending surgery General education or afib and triggers discussed    2. chadsvasc  score of 3 For now continue apixaban 2.5 mg daily Will likely need to stop for pending surgery Do not like to stop anticoagulation for 4 weeks after cardioversion but pt is in distress and may  Need  surgery for relief  Sees surgeon 10/28   F/u with pt in one month Will consider getting echo at that time   [place]. Christabella Alvira, [facility] [address] [place], [place] [address] [phone]

## 2019-01-15 NOTE — Consult Note (Addendum)
Cardiology Consultation:   Patient ID: DMICHAEL WESLER MRN: [id number]; DOB: [birth year]  Admit date: 01/15/2019 Date of Consult: 01/15/2019  Primary Care Provider: Milana Na., MD Primary Cardiologist: No primary care provider on file.  Primary Electrophysiologist:  None    Patient Profile:   John Livingston is a 78 y.o. male with a hx of HTN, GERD, prostate cancer undergoing radiation, and recently diagnosed afib s/p DCCV 01/13/19 on Eliquis who is being seen today for the evaluation of cardiac pre-operative evaluation for acute cholycystitis at the request of Dr. Doristine Bosworth.  History of Present Illness:   This is the 3rd ED visit in the last week. The first was 01/10/19 for abdominal pain. A CT scan showed colonic diverticulosis, small gallstones, and mild intrahepatic biliary ductal dilation. He was given cipro and Flagyl for possible diverticulitis.  On 01/13/19 was seen in the ED for new onset afib. He was feeling weak and 911 was called. The patient was found to be in Afib RVR with hypotension. He was succesfully converted en route. CHADSVASC 3. Eliquis 2.5 mg BID was started. He was seen in the office 01/14/19. Recommendation for 4 weeks of anticoagulation post-cardioversion knowing patient might need urgent surgery for abdominal pain.   He presented again to the ED 01/15/19 with worsening abdominal pain. He saw Dr. Ninfa Linden in the morning who urged the patient to return to the ED for ongoing abdominal pain. Pain is 10/10, radiating to his back. Denies fever or vomiting, but does have nausea. He complete Cipro/Flagyl course yesterday. Labs revealed potassium 3.4, normal L.A. RUQ US revealed acute cholycystitis. General surgery was consulted and patient was admitted. He is NPO.   Denies tobacco/alcohol/drug use. He gets radiation 5 days a week (last one was Friday).   Heart Pathway Score:     Past Medical History:  Diagnosis Date  . Baker's cyst of knee    left  . Cancer  of appendix ([place])   . Moderate persistent asthma   . Ureteral stone     Past Surgical History:  Procedure Laterality Date  . APPENDECTOMY    . cartilage surgery in nose       Home Medications:  Prior to Admission medications   Medication Sig Start Date End Date Taking? Authorizing Provider  albuterol (PROVENTIL HFA;VENTOLIN HFA) 108 (90 BASE) MCG/ACT inhaler Inhale 2 puffs into the lungs every 6 (six) hours as needed for wheezing or shortness of breath. 04/18/12  Yes Elsie Stain, MD  amLODipine (NORVASC) 5 MG tablet Take 5 mg by mouth daily.    Yes [provider]  apixaban (ELIQUIS) 2.5 MG TABS tablet Take 1 tablet (2.5 mg total) by mouth 2 (two) times daily. [id number] Yes Delora Fuel, MD  ciprofloxacin (CIPRO) 500 MG tablet Take 1 tablet (500 mg total) by mouth 2 (two) times daily. 01/10/19  Yes Fredia Sorrow, MD  Doxylamine Succinate, Sleep, (SLEEP AID PO) Take 2 tablets by mouth at bedtime.   Yes [provider]  hydrochlorothiazide (HYDRODIURIL) 25 MG tablet Take 1 tablet (25 mg total) by mouth daily. 04/30/13  Yes Palumbo, April, MD  metroNIDAZOLE (FLAGYL) 500 MG tablet Take 1 tablet (500 mg total) by mouth 3 (three) times daily for 7 days. 01/10/19 01/17/19 Yes Fredia Sorrow, MD  omeprazole (PRILOSEC) 20 MG capsule Take 1 capsule (20 mg total) by mouth daily. 04/18/12  Yes Elsie Stain, MD  tamsulosin (FLOMAX) 0.4 MG CAPS capsule Take 0.4 mg by mouth.   Yes  [provider]  TRAVATAN Z 0.004 % SOLN ophthalmic solution Place 1 drop into both eyes 3 (three) times daily.    Yes [provider]  HYDROcodone-acetaminophen (NORCO/VICODIN) 5-325 MG tablet Take 1 tablet by mouth every 6 (six) hours as needed for moderate pain. Patient not taking: Reported on 01/15/2019 01/10/19   Fredia Sorrow, MD  ondansetron (ZOFRAN ODT) 4 MG disintegrating tablet Take 1 tablet (4 mg total) by mouth every 8 (eight) hours as needed. 01/10/19    Fredia Sorrow, MD    Inpatient Medications: Scheduled Meds: . pantoprazole  40 mg Oral Daily   Continuous Infusions: . 0.9 % NaCl with KCl 20 mEq / L 100 mL/hr at 01/15/19 1356  . piperacillin-tazobactam     PRN Meds: acetaminophen **OR** acetaminophen, albuterol, labetalol, morphine injection, ondansetron **OR** ondansetron (ZOFRAN) IV  Allergies:   No Known Allergies  Social History:   Social History   Socioeconomic History  . Marital status: Single    Spouse name: Not on file  . Number of children: Not on file  . Years of education: Not on file  . Highest education level: Not on file  Occupational History  . Occupation: IT trainer    Comment: [employer]  Social Needs  . Financial resource strain: Not on file  . Food insecurity    Worry: Not on file    Inability: Not on file  . Transportation needs    Medical: Not on file    Non-medical: Not on file  Tobacco Use  . Smoking status: Former Smoker    Packs/day: 1.00    Years: 20.00    Pack years: 20.00    Types: Cigarettes    Quit date: 03/20/1978    Years since quitting: 40.8  . Smokeless tobacco: Never Used  Substance and Sexual Activity  . Alcohol use: Yes    Alcohol/week: 1.0 standard drinks    Types: 1 Cans of beer per week    Comment: occ  . Drug use: No  . Sexual activity: Not on file  Lifestyle  . Physical activity    Days per week: Not on file    Minutes per session: Not on file  . Stress: Not on file  Relationships  . Social Herbalist on phone: Not on file    Gets together: Not on file    Attends religious service: Not on file    Active member of club or organization: Not on file    Attends meetings of clubs or organizations: Not on file    Relationship status: Not on file  . Intimate partner violence    Fear of current or ex partner: Not on file    Emotionally abused: Not on file    Physically abused: Not on file    Forced sexual activity: Not on file  Other Topics  Concern  . Not on file  Social History Narrative  . Not on file    Family History:   Family History  Problem Relation Age of Onset  . Asthma Son        as a child  . Heart disease Mother   . Prostate cancer Father      ROS:  Please see the history of present illness.  All other ROS reviewed and negative.     Physical Exam/Data:   Vitals:   01/15/19 1200 01/15/19 1215 01/15/19 1345 01/15/19 1400  BP: (!) 116/59 120/62  135/67  Pulse: 71 77 72  87  Resp: 15 16 16  (!) 21  Temp:   99.4 F (37.4 C)   TempSrc:   Oral   SpO2: 92% 93% 94% 92%    Intake/Output Summary (Last 24 hours) at 01/15/2019 1520 Last data filed at 01/15/2019 1322 Gross per 24 hour  Intake 50 ml  Output -  Net 50 ml   Last 3 Weights 01/14/2019 01/10/2019 01/28/2018  Weight (lbs) 182 lb 9.6 oz 180 lb 187 lb  Weight (kg) 82.827 kg 81.647 kg 84.823 kg     There is no height or weight on file to calculate BMI.  General:  Well nourished, well developed, in no acute distress HEENT: normal Lymph: no adenopathy Neck: no JVD Endocrine:  No thryomegaly Vascular: No carotid bruits; FA pulses 2+ bilaterally without bruits  Cardiac:  normal S1, S2; RRR; no murmur  Lungs:  clear to auscultation bilaterally, no wheezing, rhonchi or rales  Abd: soft, nontender, no hepatomegaly  Ext: no edema Musculoskeletal:  No deformities, BUE and BLE strength normal and equal Skin: warm and dry  Neuro:  CNs 2-12 intact, no focal abnormalities noted Psych:  Normal affect   EKG:  The EKG was personally reviewed and demonstrates:  NSR, 74 bpm, RAD, Qtc 44 ms Telemetry:  Telemetry was personally reviewed and demonstrates:  NSR, HR 70s, no other arrhythmias noted  Relevant CV Studies:  N/A (plan for outpatient echo)  Laboratory Data:  High Sensitivity Troponin:   Recent Labs  Lab 01/10/19 1826 01/10/19 2035  TROPONINIHS 2 2     Chemistry Recent Labs  Lab 01/10/19 1825 01/13/19 0220 01/15/19 1101  NA 137 129*  134*  K 3.8 4.4 3.4*  CL 103 93* 101  CO2 24 23 25   GLUCOSE 155* 126* 127*  BUN 18 22 19   CREATININE 1.36* 1.70* 1.46*  CALCIUM 9.1 8.6* 8.7*  GFRNONAA 49* 37* 45*  GFRAA 57* 43* 52*  ANIONGAP 10 13 8     Recent Labs  Lab 01/10/19 1825 01/13/19 0220 01/15/19 1101  PROT 6.4* 6.4* 6.3*  ALBUMIN 4.1 3.3* 2.9*  AST 21 33 15  ALT 15 23 14   ALKPHOS 54 42 58  BILITOT 0.7 2.0* 0.8   Hematology Recent Labs  Lab 01/10/19 1826 01/13/19 0220 01/15/19 1101  WBC 5.0 14.2* 10.1  RBC 4.07* 4.19* 3.81*  HGB 12.5* 13.1 11.9*  HCT 36.4* 37.0* 33.7*  MCV 89.4 88.3 88.5  MCH 30.7 31.3 31.2  MCHC 34.3 35.4 35.3  RDW 13.3 13.4 13.7  PLT 189 150 207   BNPNo results for input(s): BNP, PROBNP in the last 168 hours.  DDimer  Recent Labs  Lab 01/10/19 2211  DDIMER <0.27     Radiology/Studies:  Dg Chest Port 1 View  Result Date: 01/13/2019 CLINICAL DATA:  Weakness EXAM: PORTABLE CHEST 1 VIEW COMPARISON:  01/10/2019 FINDINGS: Cardiac shadow is within normal limits. Aortic calcifications are seen. Mild right basilar atelectasis is noted. No focal confluent infiltrate or effusion is seen. No bony abnormality is noted. IMPRESSION: Right basilar atelectasis. Electronically Signed   By: Inez Catalina M.D.   On: 01/13/2019 02:08   US Abdomen Limited Ruq  Result Date: 01/15/2019 CLINICAL DATA:  Right upper quadrant pain EXAM: ULTRASOUND ABDOMEN LIMITED RIGHT UPPER QUADRANT COMPARISON:  None. FINDINGS: Gallbladder: The gallbladder is partially decompressed with apparent wall thickening measuring up to 10 mm. There is a positive sonographic Murphy sign and small amount of pericholecystic fluid. Layering hyperechoic sludge is seen. There is also a  echogenic shadowing calculi measuring 1.4 cm. Common bile duct: Diameter: 4.3 mm Liver: No focal lesion identified. Within normal limits in parenchymal echogenicity. Portal vein is patent on color Doppler imaging with normal direction of blood flow towards  the liver. Other: None. IMPRESSION: Findings which could be suggestive of acute cholecystitis. Electronically Signed   By: Prudencio Pair M.D.   On: 01/15/2019 13:00    Assessment and Plan:   Pre-op Evaluation for acute cholecystitis Patient presents to the ED for the second time in the last week for abdominal pain. Symptoms consistent with acute cholecystitis. RUQ US revealed acute cholecystitis. Started on IV abx and NPO for possible procedure. General Surgery plan to do laparoscopic cholecystectomy vs cholecystostomy.  - Cardiology consulted for pre-op evaluation. Patient was recently diagnosed with Afib 01/13/19 and cardioverted by EMS en route to the hospital because patient was unstable (systolics in the [id number]). He was started on Eliquis 2.5 mg BID for anticoagulation on 01/13/19. He has taken 4 doses, last dose was last night. He was seen in the cardiology clinic yesterday. Patient ideally needs 4 weeks of anticoagulation post cardioversion. - Eliquis has been held for possible procedure. - Since admission patient has been maintaining NSR, rates in the 70s - Prior to this patient has had no ischemic work-up. Patient denies chest pain or shortness of breath. Ne recent lower leg edema or orthopnea. Plan was for outpatient echo. Would continue with this plan.  - Functional status is very good. He still works about 12 hours a day at his business. He is able to walk 1-2 blocks and a flight of stairs.  - [facility] Activity Status Index 7.21 METS - According to the Revised Cardiac Risk Index 0.4 % of perioperative events - At this point would favor medical management so patient continues 4 weeks of uninterrupted anticoagulation.  In the setting of acute cholecystitis it is likely OK to proceed with an urgent procedure ? with heparin. Will need to monitor for Afib post procedure. MD to see.  New onset Afib - s/p DCCV on 01/13/19 - EKG shows NSR - Rate controlled in the 70s - Patient was started on Eliquis  2.5 mg BID(creatinine 1.70, Age 77) yesterday. On hold for possible procedure>>restart after procedure - CHADSVASC = 3 (age, HTN)  HTN - Amlodipine and HCTZ home meds held for procedure - Labetolol IV as needed  Hypokalemia - 3.4 on admission - Receiving IV KCL  AKI - 1.46 on admission today. 1.70 on 01/13/19 - On IVF - Trend creatinine  Prostate cancer - Radiation 5 days a week - last radiation therapy on 01/10/19      For questions or updates, please contact [place] Please consult [email or web address] for contact info under     Signed, Dawt Reeb Ninfa Meeker, PA-C  01/15/2019 3:20 PM

## 2019-01-15 NOTE — ED Provider Notes (Signed)
[facility] EMERGENCY DEPARTMENT Provider Note   CSN: [id number] Arrival date & time: 01/15/19  1021     History   Chief Complaint Chief Complaint  Patient presents with  . Abdominal Pain    HPI John Livingston is a 78 y.o. male.     Patient is an 78 year old male with a history of asthma, GERD, prostate cancer who is presenting today from general surgery's office for cholecystitis.  Patient states for the last 6 days he has had ongoing epigastric and right upper quadrant pain that is not resolving.  It is worse with eating and certain movements.  6 days ago he was evaluated in the emergency room and found to have gallstones and possible diverticulitis but no evidence of cholecystitis.  Then 2 days ago he started feeling very weak and having heart palpitations.  They called 911 and patient was found to be in A. fib RVR with hypotension and was unstable resulting in cardioversion in route to the emergency room.  Patient went back into sinus rhythm and was feeling much better upon arrival to the ER.  He was started on Eliquis and saw cardiology yesterday who noted him to be in sinus rhythm and cleared him for surgery if he needed it urgently.  Patient last took a dose of Eliquis last night 2.5 mg.  He saw Dr. Ninfa Linden this morning as he continues to have ongoing abdominal pain which is intermittently helped with hydrocodone.  He was sent here for further evaluation.  Patient denies fever or vomiting but has had nausea.  He has not had a bowel movement in 6 days but has not been eating.  He denies any urinary complaints.  He was taking Cipro and Flagyl and completed the course yesterday.  The history is provided by the patient and the spouse.  Abdominal Pain   Past Medical History:  Diagnosis Date  . Baker's cyst of knee    left  . Cancer of appendix ([place])   . Moderate persistent asthma   . Ureteral stone     Patient Active Problem List   Diagnosis Date Noted  .  Moderate persistent asthma with reflux disease 04/18/2012  . Dysphagia 04/18/2012  . GERD (gastroesophageal reflux disease) 04/18/2012    Past Surgical History:  Procedure Laterality Date  . APPENDECTOMY    . cartilage surgery in nose          Home Medications    Prior to Admission medications   Medication Sig Start Date End Date Taking? Authorizing Provider  albuterol (PROVENTIL HFA;VENTOLIN HFA) 108 (90 BASE) MCG/ACT inhaler Inhale 2 puffs into the lungs every 6 (six) hours as needed for wheezing or shortness of breath. 04/18/12   Elsie Stain, MD  amLODipine (NORVASC) 5 MG tablet Take by mouth.    [provider]  apixaban (ELIQUIS) 2.5 MG TABS tablet Take 1 tablet (2.5 mg total) by mouth 2 (two) times daily. [id number]  Delora Fuel, MD  ciprofloxacin (CIPRO) 500 MG tablet Take 1 tablet (500 mg total) by mouth 2 (two) times daily. 01/10/19   Fredia Sorrow, MD  clobetasol cream (TEMOVATE) 0.05 % Apply topically. As directed    [provider]  Doxylamine Succinate, Sleep, (SLEEP AID PO) Take 2 tablets by mouth at bedtime.    [provider]  fluticasone (FLONASE) 50 MCG/ACT nasal spray Place 2 sprays into both nostrils daily. 01/28/18   Mesner, Corene Cornea, MD  hydrochlorothiazide (HYDRODIURIL) 25 MG tablet Take 1  tablet (25 mg total) by mouth daily. 04/30/13   Palumbo, April, MD  HYDROcodone-acetaminophen (NORCO/VICODIN) 5-325 MG tablet Take 1 tablet by mouth every 6 (six) hours as needed for moderate pain. 01/10/19   Fredia Sorrow, MD  metroNIDAZOLE (FLAGYL) 500 MG tablet Take 1 tablet (500 mg total) by mouth 3 (three) times daily for 7 days. 01/10/19 01/17/19  Fredia Sorrow, MD  mometasone (ASMANEX 120 METERED DOSES) 220 MCG/INH inhaler Inhale 2 puffs into the lungs daily. 04/18/12   Elsie Stain, MD  mometasone-formoterol (DULERA) 100-5 MCG/ACT AERO Inhale 2 puffs into the lungs 2 (two) times daily as needed for wheezing. 01/28/18    Mesner, Corene Cornea, MD  omeprazole (PRILOSEC) 20 MG capsule Take 1 capsule (20 mg total) by mouth daily. 04/18/12   Elsie Stain, MD  ondansetron (ZOFRAN ODT) 4 MG disintegrating tablet Take 1 tablet (4 mg total) by mouth every 8 (eight) hours as needed. 01/10/19   Fredia Sorrow, MD  oxyCODONE-acetaminophen (PERCOCET) 5-325 MG per tablet Take 1 tablet by mouth every 6 (six) hours as needed. 04/30/13   Palumbo, April, MD  tamsulosin (FLOMAX) 0.4 MG CAPS capsule Take 0.4 mg by mouth.    [provider]  TRAVATAN Z 0.004 % SOLN ophthalmic solution Place 1 drop into both eyes 2 (two) times daily.    [provider]  vardenafil (LEVITRA) 10 MG tablet Take 10 mg by mouth daily as needed for erectile dysfunction.    [provider]    Family History Family History  Problem Relation Age of Onset  . Asthma Son        as a child  . Heart disease Mother   . Prostate cancer Father     Social History Social History   Tobacco Use  . Smoking status: Former Smoker    Packs/day: 1.00    Years: 20.00    Pack years: 20.00    Types: Cigarettes    Quit date: 03/20/1978    Years since quitting: 40.8  . Smokeless tobacco: Never Used  Substance Use Topics  . Alcohol use: Yes    Alcohol/week: 1.0 standard drinks    Types: 1 Cans of beer per week    Comment: occ  . Drug use: No     Allergies   Patient has no known allergies.   Review of Systems Review of Systems  Gastrointestinal: Positive for abdominal pain.  All other systems reviewed and are negative.    Physical Exam Updated Vital Signs BP 116/61 (BP Location: Right Arm)   Pulse 70   Temp 98.3 F (36.8 C) (Oral)   Resp 16   SpO2 94%   Physical Exam Vitals signs and nursing note reviewed.  Constitutional:      General: He is not in acute distress.    Appearance: He is well-developed and normal weight.  HENT:     Head: Normocephalic and atraumatic.  Eyes:     Conjunctiva/sclera: Conjunctivae  normal.     Pupils: Pupils are equal, round, and reactive to light.  Neck:     Musculoskeletal: Normal range of motion and neck supple.  Cardiovascular:     Rate and Rhythm: Normal rate and regular rhythm.     Heart sounds: No murmur.  Pulmonary:     Effort: Pulmonary effort is normal. No respiratory distress.     Breath sounds: Normal breath sounds. No wheezing or rales.  Abdominal:     General: Abdomen is flat. There is no distension.  Palpations: Abdomen is soft.     Tenderness: There is abdominal tenderness in the right upper quadrant and epigastric area. There is no guarding or rebound.  Musculoskeletal: Normal range of motion.        General: No tenderness.  Skin:    General: Skin is warm and dry.     Findings: No erythema or rash.  Neurological:     General: No focal deficit present.     Mental Status: He is alert and oriented to person, place, and time.  Psychiatric:        Mood and Affect: Mood normal.        Behavior: Behavior normal.      ED Treatments / Results  Labs (all labs ordered are listed, but only abnormal results are displayed) Labs Reviewed  CBC WITH DIFFERENTIAL/PLATELET - Abnormal; Notable for the following components:      Result Value   RBC 3.81 (*)    Hemoglobin 11.9 (*)    HCT 33.7 (*)    Neutro Abs 7.8 (*)    Lymphs Abs 0.6 (*)    Monocytes Absolute 1.5 (*)    Abs Immature Granulocytes 0.09 (*)    All other components within normal limits  SARS CORONAVIRUS 2 BY RT PCR ([facility])  LACTIC ACID, PLASMA  COMPREHENSIVE METABOLIC PANEL    EKG None  Radiology No results found.  Procedures Procedures (including critical care time)  Medications Ordered in ED Medications - No data to display   Initial Impression / Assessment and Plan / ED Course  I have reviewed the triage vital signs and the nursing notes.  Pertinent labs & imaging results that were available during my care of the  patient were reviewed by me and considered in my medical decision making (see chart for details).        Patient presenting today from general surgery's office for cholecystitis.  Patient has not been feeling any better despite taking a course of Cipro and Flagyl.  He is even had an episode of atrial fibrillation resulting in cardioversion in the last 6 days since his symptoms have started.  Patient is in sinus rhythm today but still having ongoing epigastric pain.  He took hydrocodone 2 tablets this morning and states the pain is tolerable but still present.  He has had no vomiting or diarrhea.  Patient is afebrile here and in no acute distress.  He did last take Eliquis last night.  Will discuss with general surgery, repeat labs pending as there was some AKI noticed on his last labs from his initial visit and will start patient on IV antibiotics.  12:19 PM Surgery saw the pt and requesting med admit.  NPO for now.  Requested zosyn.  Final Clinical Impressions(s) / ED Diagnoses   Final diagnoses:  None    ED Discharge Orders    None       Blanchie Dessert, MD 01/15/19 1650

## 2019-01-15 NOTE — Consult Note (Signed)
[facility] Consult Note  John Livingston 10/28/1940  [id number].    Requesting MD: Whitney Plunkett Chief Complaint/Reason for Consult: RUQ pain  HPI:  John Livingston is an 78yo male PMH HTN, GERD, and prostate cancer currently undergoing radiation (followed by Dr. Perchinsky in [place]), who was sent to the ED from our office with concerns for cholecystitis. Patient has been see in the ED twice over the last 5 days. States that he started having upper abdominal pain on 10/23 after eating steak. Pain is RUQ/epigastric region. It is constant, worse with movement and palpation. Denies nausea, vomiting, fever, chills. States that he has never had pain like this before. Last BM 10/22. He is not passing any flatus. He has only been taking in liquids here and there since Friday.   CT scan on 10/23 showed colonic diverticulosis, small gallstones, and mild intrahepatic biliary ductal dilatation. Labwork that day showed Cr 1.36, normal LFTs, WBC and lipase. Blood work repeated on 10/26 showed WBC 14.2, Cr 1.7, lipase 20, AST 33, ALT 23, Alk phos 42, and bilirubin 2.0. He was discharged home on cipro/flagyl and scheduled outpatient follow up with surgery.  Of note, 2 days ago he started feeling very weak and having heart palpitations.  He called 911 and patient was found to be in A. fib RVR with hypotension. He was cardioverted in route to the ED and felt much better. He was started on Eliquis (last dose 10/27 in the PM) and saw cardiology yesterday.  Abdominal surgical history: appendectomy Nonsmoker Employment: owns jewelry/pa[employer]  ROS: Review of Systems  Constitutional: Negative.  Negative for chills and fever.  HENT: Negative.   Eyes: Negative.   Respiratory: Negative.   Cardiovascular: Negative.   Gastrointestinal: Positive for abdominal pain and constipation. Negative for diarrhea, nausea and vomiting.  Genitourinary: Negative.   Musculoskeletal: Negative.   Skin:  Negative.   Neurological: Negative.     All systems reviewed and otherwise negative except for as above  Family History  Problem Relation Age of Onset  . Asthma Son        as a child  . Heart disease Mother   . Prostate cancer Father     Past Medical History:  Diagnosis Date  . Baker's cyst of knee    left  . Cancer of appendix ([facility])   . Moderate persistent asthma   . Ureteral stone     Past Surgical History:  Procedure Laterality Date  . APPENDECTOMY    . cartilage surgery in nose      Social History:  reports that he quit smoking about 40 years ago. His smoking use included cigarettes. He has a 20.00 pack-year smoking history. He has never used smokeless tobacco. He reports current alcohol use of about 1.0 standard drinks of alcohol per week. He reports that he does not use drugs.  Allergies: No Known Allergies  (Not in a hospital admission)   Prior to Admission medications   Medication Sig Start Date End Date Taking? Authorizing Provider  albuterol (PROVENTIL HFA;VENTOLIN HFA) 108 (90 BASE) MCG/ACT inhaler Inhale 2 puffs into the lungs every 6 (six) hours as needed for wheezing or shortness of breath. 04/18/12   Wright, Patrick E, MD  amLODipine (NORVASC) 5 MG tablet Take by mouth.    [provider]  apixaban (ELIQUIS) 2.5 MG TABS tablet Take 1 tablet (2.5 mg total) by mouth 2 (two) times daily. 01/13/19 02/12/19  Glick, David, MD  ciprofloxacin (CIPRO) 500   MG tablet Take 1 tablet (500 mg total) by mouth 2 (two) times daily. 01/10/19   Fredia Sorrow, MD  clobetasol cream (TEMOVATE) 0.05 % Apply topically. As directed    [provider]  Doxylamine Succinate, Sleep, (SLEEP AID PO) Take 2 tablets by mouth at bedtime.    [provider]  fluticasone (FLONASE) 50 MCG/ACT nasal spray Place 2 sprays into both nostrils daily. 01/28/18   Mesner, Corene Cornea, MD  hydrochlorothiazide (HYDRODIURIL) 25 MG tablet Take 1 tablet (25 mg total) by mouth daily.  04/30/13   Palumbo, April, MD  HYDROcodone-acetaminophen (NORCO/VICODIN) 5-325 MG tablet Take 1 tablet by mouth every 6 (six) hours as needed for moderate pain. 01/10/19   Fredia Sorrow, MD  metroNIDAZOLE (FLAGYL) 500 MG tablet Take 1 tablet (500 mg total) by mouth 3 (three) times daily for 7 days. 01/10/19 01/17/19  Fredia Sorrow, MD  mometasone (ASMANEX 120 METERED DOSES) 220 MCG/INH inhaler Inhale 2 puffs into the lungs daily. 04/18/12   Elsie Stain, MD  mometasone-formoterol (DULERA) 100-5 MCG/ACT AERO Inhale 2 puffs into the lungs 2 (two) times daily as needed for wheezing. 01/28/18   Mesner, Corene Cornea, MD  omeprazole (PRILOSEC) 20 MG capsule Take 1 capsule (20 mg total) by mouth daily. 04/18/12   Elsie Stain, MD  ondansetron (ZOFRAN ODT) 4 MG disintegrating tablet Take 1 tablet (4 mg total) by mouth every 8 (eight) hours as needed. 01/10/19   Fredia Sorrow, MD  oxyCODONE-acetaminophen (PERCOCET) 5-325 MG per tablet Take 1 tablet by mouth every 6 (six) hours as needed. 04/30/13   Palumbo, April, MD  tamsulosin (FLOMAX) 0.4 MG CAPS capsule Take 0.4 mg by mouth.    [provider]  TRAVATAN Z 0.004 % SOLN ophthalmic solution Place 1 drop into both eyes 2 (two) times daily.    [provider]  vardenafil (LEVITRA) 10 MG tablet Take 10 mg by mouth daily as needed for erectile dysfunction.    [provider]    Blood pressure 116/61, pulse 70, temperature 98.3 F (36.8 C), temperature source Oral, resp. rate 16, SpO2 94 %. Physical Exam: General: pleasant, WD/WN white male who is laying in bed in NAD HEENT: head is normocephalic, atraumatic.  Sclera are noninjected.  Pupils equal and round.  Ears and nose without any masses or lesions.  Mouth is pink and moist. Dentition fair Heart: regular, rate, and rhythm.  No obvious murmurs, gallops, or rubs noted.  Palpable pedal pulses bilaterally Lungs: CTAB, no wheezes, rhonchi, or rales noted.  Respiratory effort  nonlabored Abd: soft, ND, +BS, no masses, hernias, or organomegaly. TTP RUQ and epigastric region with guarding; otherwise abdomen nontender MS: calves soft and nontender Skin: warm and dry with no masses, lesions, or rashes Psych: A&Ox3 with an appropriate affect. Neuro: cranial nerves grossly intact, extremity CSM intact bilaterally, normal speech  Results for orders placed or performed during the hospital encounter of 01/15/19 (from the past 48 hour(s))  CBC with Differential/Platelet     Status: Abnormal   Collection Time: 01/15/19 11:01 AM  Result Value Ref Range   WBC 10.1 4.0 - 10.5 K/uL   RBC 3.81 (L) 4.22 - 5.81 MIL/uL   Hemoglobin 11.9 (L) 13.0 - 17.0 g/dL   HCT 33.7 (L) 39.0 - 52.0 %   MCV 88.5 80.0 - 100.0 fL   MCH 31.2 26.0 - 34.0 pg   MCHC 35.3 30.0 - 36.0 g/dL   RDW 13.7 11.5 - 15.5 %   Platelets 207 150 -  400 K/uL   nRBC 0.0 0.0 - 0.2 %   Neutrophils Relative % 76 %   Neutro Abs 7.8 (H) 1.7 - 7.7 K/uL   Lymphocytes Relative 6 %   Lymphs Abs 0.6 (L) 0.7 - 4.0 K/uL   Monocytes Relative 15 %   Monocytes Absolute 1.5 (H) 0.1 - 1.0 K/uL   Eosinophils Relative 1 %   Eosinophils Absolute 0.1 0.0 - 0.5 K/uL   Basophils Relative 1 %   Basophils Absolute 0.1 0.0 - 0.1 K/uL   Immature Granulocytes 1 %   Abs Immature Granulocytes 0.09 (H) 0.00 - 0.07 K/uL    Comment: Performed at [facility], [address] [place], [place] [address]  Lactic acid, plasma     Status: None   Collection Time: 01/15/19 11:01 AM  Result Value Ref Range   Lactic Acid, Venous 1.0 0.5 - 1.9 mmol/L    Comment: Performed at [facility], [address] [place], [place] [address]   No results found.    Assessment/Plan HTN GERD Prostate cancer currently undergoing radiation (followed by Dr. Perchinsky in [place]) Diverticulosis Atrial fibrillation - successfully cardioverted 10/26. Started Eliquis (last dose 10/27 in PM) AKI - Cr 1.46 Covid test negative  10/28  RUQ/epigastric pain Cholelithiasis ?Acute cholecystitis - Patient with clinical history and physical exam consistent with acute cholecystitis. His WBC and LFTs are WNL today. Will obtain u/s for further work up. Keep NPO. Starting on IV zosyn. If this is cholecystitis patient will require either laparoscopic cholecystectomy vs cholecystostomy tube placement. I will ask cardiology to see for surgical clearance. Hold eliquis in preparation for surgery.   ID - zosyn VTE - SCDs FEN - IVF, NPO Foley - none Follow up - TBD  Brooke A Meuth, PA-C [facility] 01/15/2019, 12:21 PM Please see Amion for pager number during day hours 7:00am-4:30pm  

## 2019-01-16 ENCOUNTER — Inpatient Hospital Stay (HOSPITAL_COMMUNITY): Payer: Medicare Other

## 2019-01-16 DIAGNOSIS — I10 Essential (primary) hypertension: Secondary | ICD-10-CM

## 2019-01-16 DIAGNOSIS — N179 Acute kidney failure, unspecified: Secondary | ICD-10-CM | POA: Diagnosis not present

## 2019-01-16 DIAGNOSIS — J4520 Mild intermittent asthma, uncomplicated: Secondary | ICD-10-CM

## 2019-01-16 DIAGNOSIS — I480 Paroxysmal atrial fibrillation: Secondary | ICD-10-CM | POA: Diagnosis not present

## 2019-01-16 DIAGNOSIS — E876 Hypokalemia: Secondary | ICD-10-CM

## 2019-01-16 DIAGNOSIS — K810 Acute cholecystitis: Secondary | ICD-10-CM | POA: Diagnosis not present

## 2019-01-16 LAB — COMPREHENSIVE METABOLIC PANEL
ALT: 14 U/L (ref 0–44)
AST: 17 U/L (ref 15–41)
Albumin: 2.6 g/dL — ABNORMAL LOW (ref 3.5–5.0)
Alkaline Phosphatase: 78 U/L (ref 38–126)
Anion gap: 7 (ref 5–15)
BUN: 13 mg/dL (ref 8–23)
CO2: 23 mmol/L (ref 22–32)
Calcium: 8.3 mg/dL — ABNORMAL LOW (ref 8.9–10.3)
Chloride: 106 mmol/L (ref 98–111)
Creatinine, Ser: 1.28 mg/dL — ABNORMAL HIGH (ref 0.61–1.24)
GFR calc Af Amer: >60 mL/min (ref >60)
GFR calc non Af Amer: 53 mL/min — ABNORMAL LOW (ref >60)
Glucose, Bld: 116 mg/dL — ABNORMAL HIGH (ref 70–99)
Potassium: 3.8 mmol/L (ref 3.5–5.1)
Sodium: 136 mmol/L (ref 135–145)
Total Bilirubin: 0.9 mg/dL (ref 0.3–1.2)
Total Protein: 5.8 g/dL — ABNORMAL LOW (ref 6.5–8.1)

## 2019-01-16 LAB — CBC
HCT: 31.1 % — ABNORMAL LOW (ref 39.0–52.0)
Hemoglobin: 10.9 g/dL — ABNORMAL LOW (ref 13.0–17.0)
MCH: 31 pg (ref 26.0–34.0)
MCHC: 35 g/dL (ref 30.0–36.0)
MCV: 88.4 fL (ref 80.0–100.0)
Platelets: 212 10*3/uL (ref 150–400)
RBC: 3.52 MIL/uL — ABNORMAL LOW (ref 4.22–5.81)
RDW: 13.7 % (ref 11.5–15.5)
WBC: 9.7 10*3/uL (ref 4.0–10.5)
nRBC: 0 % (ref 0.0–0.2)

## 2019-01-16 LAB — PROTIME-INR
INR: 1.3 — ABNORMAL HIGH (ref 0.8–1.2)
Prothrombin Time: 16.4 seconds — ABNORMAL HIGH (ref 11.4–15.2)

## 2019-01-16 MED ORDER — TECHNETIUM TC 99M MEBROFENIN IV KIT
5.1000 | PACK | Freq: Once | INTRAVENOUS | Status: AC | PRN
  Administered 2019-01-16: 5.1 via INTRAVENOUS

## 2019-01-16 MED ORDER — MORPHINE SULFATE (PF) 4 MG/ML IV SOLN
INTRAVENOUS | Status: AC
  Filled 2019-01-16: qty 1

## 2019-01-16 MED ORDER — MORPHINE SULFATE (PF) 4 MG/ML IV SOLN
3.0000 mg | Freq: Once | INTRAVENOUS | Status: AC
  Administered 2019-01-16: 3 mg via INTRAVENOUS

## 2019-01-16 MED ORDER — POLYETHYLENE GLYCOL 3350 17 G PO PACK: 17.0000 g | PACK | Freq: Every day | ORAL | Status: DC

## 2019-01-16 MED ORDER — POLYETHYLENE GLYCOL 3350 17 G PO PACK
17.0000 g | PACK | Freq: Every day | ORAL | Status: DC
  Administered 2019-01-16: 17 g via ORAL
  Filled 2019-01-16 (×2): qty 1

## 2019-01-16 NOTE — Progress Notes (Signed)
Subjective No acute events. Still with RUQ pain with movement. Denies any pain at rest  Objective: Vital signs in last 24 hours: Temp:  [98.3 F (36.8 C)-99.9 F (37.7 C)] 98.4 F (36.9 C) (10/29 [id number]) Pulse Rate:  [70-87] 74 (10/29 0623) Resp:  [14-21] 18 (10/29 0623) BP: (99-135)/(56-105) 126/56 (10/29 0623) SpO2:  [91 %-95 %] 94 % (10/29 0623) Weight:  [83.1 kg] 83.1 kg (10/28 1705) Last BM Date: 01/09/19(pt states this is normal per his cancer treatment regiment)  Intake/Output from previous day: 10/28 0701 - 10/29 0700 In: 593.9 [P.O.:240; I.V.:303.9; IV Piggyback:50] Out: -  Intake/Output this shift: No intake/output data recorded.  Gen: NAD, comfortable CV: RRR Pulm: Normal work of breathing Abd: Soft, moderate RUQ tenderness; nondistended Ext: SCDs in place  Lab Results: CBC  Recent Labs    01/15/19 1101 01/16/19 0533  WBC 10.1 9.7  HGB 11.9* 10.9*  HCT 33.7* 31.1*  PLT 207 212   BMET Recent Labs    01/15/19 1101 01/16/19 0533  NA 134* 136  K 3.4* 3.8  CL 101 106  CO2 25 23  GLUCOSE 127* 116*  BUN 19 13  CREATININE 1.46* 1.28*  CALCIUM 8.7* 8.3*   PT/INR Recent Labs    01/16/19 0533  LABPROT 16.4*  INR 1.3*   ABG No results for input(s): PHART, HCO3 in the last 72 hours.  Invalid input(s): PCO2, PO2  Studies/Results:  Anti-infectives: Anti-infectives (From admission, onward)   Start     Dose/Rate Route Frequency Ordered Stop   01/15/19 2030  piperacillin-tazobactam (ZOSYN) IVPB 3.375 g     3.375 g 100 mL/hr over 30 Minutes Intravenous  Once 01/15/19 1310 01/15/19 2111   01/15/19 1315  piperacillin-tazobactam (ZOSYN) IVPB 3.375 g  Status:  Discontinued     3.375 g 100 mL/hr over 30 Minutes Intravenous  Once 01/15/19 1307 01/15/19 1310   01/15/19 1230  piperacillin-tazobactam (ZOSYN) IVPB 3.375 g     3.375 g 100 mL/hr over 30 Minutes Intravenous  Once 01/15/19 1219 01/15/19 1322   01/15/19 1215  cefTRIAXone (ROCEPHIN) 1 g in sodium  chloride 0.9 % 100 mL IVPB  Status:  Discontinued     1 g 200 mL/hr over 30 Minutes Intravenous  Once 01/15/19 1202 01/15/19 1218       Assessment/Plan: Patient Active Problem List   Diagnosis Date Noted  . Acute cholecystitis 01/15/2019  . AF (paroxysmal atrial fibrillation) ([place]) 01/15/2019  . Prostate cancer ([place]) 01/15/2019  . Mild intermittent asthma 01/15/2019  . Hypertension 01/15/2019  . AKI (acute kidney injury) ([place]) 01/15/2019  . Hypokalemia 01/15/2019  . Preoperative cardiovascular examination   . Moderate persistent asthma with reflux disease 04/18/2012  . Dysphagia 04/18/2012  . GERD (gastroesophageal reflux disease) 04/18/2012   -Ideally continue anticoagulation due to stroke risk as per cards in setting of very recent cardioversion - agree with placement of perc chole drain which would address his cholecystitis adequately -IR has been consulted and requested a HIDA   LOS: 1 day   Sharon Mt. Dema Severin, M.D. [facility] Use [email or web address] to contact on call provider

## 2019-01-16 NOTE — Progress Notes (Signed)
Patient ID: John Livingston, male   DOB: April 11, 1940, 79 y.o.   MRN: [id number]   Request for percutaneous cholecystostomy drain placement  Pt with RUQ pain  CT 10/23: IMPRESSION: 1. Colonic diverticulosis. Mild perisigmoid stranding may be chronic or represent mild acute diverticulitis. Clinical correlation is recommended. No diverticular abscess or perforation. 2. Small gallstones.  No evidence of acute cholecystitis by CT. 3. 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 Follow-up as previously recommended. 4. Mildly enlarged prostate gland with median lobe hypertrophy.  [place] yesterday: IMPRESSION: Findings which could be suggestive of acute cholecystitis.  Imaging reviewed with Dr Vernard Gambles Will order HIDA for this am  If appropriate - will move ahead with Perc chole.  Pt LD Eliquis 10/27 per chart No Eliquis  10/28 or today per RN

## 2019-01-16 NOTE — Progress Notes (Signed)
PROGRESS NOTE    John Livingston  [id number] DOB: Feb 02, 1941 DOA: 01/15/2019 PCP: Milana Na., MD   Brief Narrative:  HPI on 01/15/2019 by Dr. Early Osmond John Livingston is a 78 y.o. male with medical history significant of prostate cancer-currently on radiation therapy, GERD, well-controlled asthma, new onset A. Fib-on Eliquis presents to emergency department due to worsening right upper quadrant pain since 6 days.  Reports pain is 10 out of 10, sometimes radiates to his back, aggravates with movements and eating food, relieves with nothing, denies association with nausea, vomiting, melena, hematemesis, fever, chills, pruritus, change in skin or urine color.  Had bowel movement 6 days ago.  His appetite is somewhat decreased due to pain however denies weight loss, night sweats, over-the-counter use of NSAIDs.  Patient evaluated in ED last week with similar symptoms and CT abdomen showed: Colonic diverticulosis, small gallstones, andmild intrahepatic biliary ductal dilatation and was discharged home on Cipro and Flagyl.   On 01/13/2019 patient was very weak and having heart palpitation-family called 911 and patient was found to be in A. fib with RVR with hypotension.  Patient was cardioverted in route to the emergency department.  Patient went back to sinus rhythm and was feeling much better upon arrival to the ER.  Patient started on Eliquis and discharged home in stable condition.  He evaluated by cardiology yesterday.  He took Eliquis last night.  He has been taking hydrocodone for right upper quadrant pain/epigastric pain with no help at all.  He has been taking his Cipro and Flagyl as prescribed and finish the course yesterday.  He was seen by Dr. Ninfa Linden this morning and due to constant abdominal pain he was advised to go to the ER for further evaluation and management.  He lives with his wife and denies smoking, alcohol, illicit drug use.  He is getting radiation therapy 5  days in a week from M-F.  His last radiation therapy was on last Friday.  Assessment & Plan   Acute cholecystitis -RUQ ultrasound showed findings suggestive of acute cholecystitis -Currently afebrile with no leukocytosis -LFTs and lipase within normal limits -Blood culture showed no growth to date -Continue IV Zosyn, IV fluids -Currently n.p.o. -Continue morphine as needed for pain control although patient does state that hydrocodone seems to help his pain better -General surgery consulted and appreciated -Cardiology also consulted for preoperative assessment, recommended patient wait approximately 1 month if possible.  However if surgery is needed immediately, would bridge with heparin.  New onset atrial fibrillation -Status post cardioversion on 01/13/2019 -As above Eliquis held -Patient currently in sinus rhythm -Cardiology consulted and appreciated  Essential hypertension -Amlodipine and HCTZ currently held -BP stable -Continue labetalol PRN  Hypokalemia -resolved, continue to monitor BMP  Acute kidney injury -Creatinine improving, currently down to 1.28, baseline approximate 1.1 -Continue to monitor BMP  Prostate cancer -Last radiation therapy was on 01/10/2019  Mild intermittent asthma -Currently stable, no wheezing on exam -Continue albuterol as needed  DVT Prophylaxis  SCDs  Code Status: Full  Family Communication: None at bedside  Disposition Plan: Admitted, pending further surgical recommendations  Consultants General surgery Cardiology  Procedures  None  Antibiotics   Anti-infectives (From admission, onward)   Start     Dose/Rate Route Frequency Ordered Stop   01/15/19 2030  piperacillin-tazobactam (ZOSYN) IVPB 3.375 g     3.375 g 100 mL/hr over 30 Minutes Intravenous  Once 01/15/19 1310 01/15/19 2111   01/15/19 1315  piperacillin-tazobactam (ZOSYN)  IVPB 3.375 g  Status:  Discontinued     3.375 g 100 mL/hr over 30 Minutes Intravenous  Once  01/15/19 1307 01/15/19 1310   01/15/19 1230  piperacillin-tazobactam (ZOSYN) IVPB 3.375 g     3.375 g 100 mL/hr over 30 Minutes Intravenous  Once 01/15/19 1219 01/15/19 1322   01/15/19 1215  cefTRIAXone (ROCEPHIN) 1 g in sodium chloride 0.9 % 100 mL IVPB  Status:  Discontinued     1 g 200 mL/hr over 30 Minutes Intravenous  Once 01/15/19 1202 01/15/19 1218      Subjective:   John Livingston seen and examined today.  Patient continues to have abdominal pain, states it is worse with movement and feels very sore.  Denies current nausea or vomiting.  Denies current chest pain or shortness of breath, dizziness or headache.  Wants to have surgery; does not feel that he can wait 1 month.    Objective:   Vitals:   01/15/19 1656 01/15/19 1705 01/16/19 0147 01/16/19 0623  BP: 134/73  122/60 (!) 126/56  Pulse:   77 74  Resp:   18 18  Temp:   99 F (37.2 C) 98.4 F (36.9 C)  TempSrc:   Oral Oral  SpO2:   92% 94%  Weight:  83.1 kg    Height:  6\' 3"  (1.905 m)      Intake/Output Summary (Last 24 hours) at 01/16/2019 [id number] Last data filed at 01/16/2019 0300 Gross per 24 hour  Intake 593.88 ml  Output --  Net 593.88 ml   Filed Weights   01/15/19 1705  Weight: 83.1 kg   Exam  General: Well developed, well nourished, NAD, appears stated age  41: NCAT,  mucous membranes moist.   Cardiovascular: S1 S2 auscultated, RRR, no murmur  Respiratory: Clear to auscultation bilaterally with equal chest rise  Abdomen: Soft, RUQ/epigastric TTP, nondistended, + bowel sounds  Extremities: warm dry without cyanosis clubbing or edema  Neuro: AAOx3, focal  Psych: Appropriate mood and affect, pleasant  Data Reviewed: I have personally reviewed following labs and imaging studies  CBC: Recent Labs  Lab 01/10/19 1826 01/13/19 0220 01/15/19 1101 01/16/19 0533  WBC 5.0 14.2* 10.1 9.7  NEUTROABS  --  12.4* 7.8*  --   HGB 12.5* 13.1 11.9* 10.9*  HCT 36.4* 37.0* 33.7* 31.1*  MCV 89.4 88.3 88.5  88.4  PLT 189 150 207 [id number]   Basic Metabolic Panel: Recent Labs  Lab 01/10/19 1825 01/13/19 0220 01/15/19 1101 01/15/19 1713 01/16/19 0533  NA 137 129* 134*  --  136  K 3.8 4.4 3.4*  --  3.8  CL 103 93* 101  --  106  CO2 24 23 25   --  23  GLUCOSE 155* 126* 127*  --  116*  BUN 18 22 19   --  13  CREATININE 1.36* 1.70* 1.46*  --  1.28*  CALCIUM 9.1 8.6* 8.7*  --  8.3*  MG  --  2.0  --  2.1  --   PHOS  --   --   --  3.6  --    GFR: Estimated Creatinine Clearance: 54.1 mL/min (A) (by C-G formula based on SCr of 1.28 mg/dL (H)). Liver Function Tests: Recent Labs  Lab 01/10/19 1825 01/13/19 0220 01/15/19 1101 01/16/19 0533  AST 21 33 15 17  ALT 15 23 14 14   ALKPHOS 54 42 58 78  BILITOT 0.7 2.0* 0.8 0.9  PROT 6.4* 6.4* 6.3* 5.8*  ALBUMIN 4.1 3.3* 2.9* 2.6*  Recent Labs  Lab 01/10/19 1826 01/13/19 0220 01/15/19 1713  LIPASE 49 20 28   No results for input(s): AMMONIA in the last 168 hours. Coagulation Profile: Recent Labs  Lab 01/16/19 0533  INR 1.3*   Cardiac Enzymes: No results for input(s): CKTOTAL, CKMB, CKMBINDEX, TROPONINI in the last 168 hours. BNP (last 3 results) No results for input(s): PROBNP in the last 8760 hours. HbA1C: No results for input(s): HGBA1C in the last 72 hours. CBG: Recent Labs  Lab 01/13/19 0137  GLUCAP 116*   Lipid Profile: No results for input(s): CHOL, HDL, LDLCALC, TRIG, CHOLHDL, LDLDIRECT in the last 72 hours. Thyroid Function Tests: No results for input(s): TSH, T4TOTAL, FREET4, T3FREE, THYROIDAB in the last 72 hours. Anemia Panel: No results for input(s): VITAMINB12, FOLATE, FERRITIN, TIBC, IRON, RETICCTPCT in the last 72 hours. Urine analysis:    Component Value Date/Time   COLORURINE YELLOW 01/10/2019 1846   APPEARANCEUR CLEAR 01/10/2019 1846   LABSPEC 1.025 01/10/2019 1846   PHURINE 5.5 01/10/2019 1846   GLUCOSEU NEGATIVE 01/10/2019 1846   HGBUR NEGATIVE 01/10/2019 1846   BILIRUBINUR NEGATIVE 01/10/2019 1846    KETONESUR NEGATIVE 01/10/2019 1846   PROTEINUR NEGATIVE 01/10/2019 1846   UROBILINOGEN 0.2 08/02/2013 1122   NITRITE NEGATIVE 01/10/2019 1846   LEUKOCYTESUR NEGATIVE 01/10/2019 1846   Sepsis Labs: @LABRCNTIP (procalcitonin:4,lacticidven:4)  ) Recent Results (from the past 240 hour(s))  SARS Coronavirus 2 by RT PCR (hospital order, performed in [place] hospital lab) Nasopharyngeal Nasopharyngeal Swab     Status: None   Collection Time: 01/15/19 11:18 AM   Specimen: Nasopharyngeal Swab  Result Value Ref Range Status   SARS Coronavirus 2 NEGATIVE NEGATIVE Final    Comment: (NOTE) If result is NEGATIVE SARS-CoV-2 target nucleic acids are NOT DETECTED. The SARS-CoV-2 RNA is generally detectable in upper and lower  respiratory specimens during the acute phase of infection. The lowest  concentration of SARS-CoV-2 viral copies this assay can detect is 250  copies / mL. A negative result does not preclude SARS-CoV-2 infection  and should not be used as the sole basis for treatment or other  patient management decisions.  A negative result may occur with  improper specimen collection / handling, submission of specimen other  than nasopharyngeal swab, presence of viral mutation(s) within the  areas targeted by this assay, and inadequate number of viral copies  (<250 copies / mL). A negative result must be combined with clinical  observations, patient history, and epidemiological information. If result is POSITIVE SARS-CoV-2 target nucleic acids are DETECTED. The SARS-CoV-2 RNA is generally detectable in upper and lower  respiratory specimens dur ing the acute phase of infection.  Positive  results are indicative of active infection with SARS-CoV-2.  Clinical  correlation with patient history and other diagnostic information is  necessary to determine patient infection status.  Positive results do  not rule out bacterial infection or co-infection with other viruses. If result is  PRESUMPTIVE POSTIVE SARS-CoV-2 nucleic acids MAY BE PRESENT.   A presumptive positive result was obtained on the submitted specimen  and confirmed on repeat testing.  While 2019 novel coronavirus  (SARS-CoV-2) nucleic acids may be present in the submitted sample  additional confirmatory testing may be necessary for epidemiological  and / or clinical management purposes  to differentiate between  SARS-CoV-2 and other Sarbecovirus currently known to infect humans.  If clinically indicated additional testing with an alternate test  methodology [phone]) is advised. The SARS-CoV-2 RNA is generally  detectable in  upper and lower respiratory sp ecimens during the acute  phase of infection. The expected result is Negative. Fact Sheet for Patients:  StrictlyIdeas.no Fact Sheet for Healthcare Providers: BankingDealers.co.za This test is not yet approved or cleared by the [place] FDA and has been authorized for detection and/or diagnosis of SARS-CoV-2 by FDA under an Emergency Use Authorization (EUA).  This EUA will remain in effect (meaning this test can be used) for the duration of the COVID-19 declaration under Section 564(b)(1) of the Act, 21 U.S.C. section 360bbb-3(b)(1), unless the authorization is terminated or revoked sooner. Performed at [facility], [place] [address], [place], [place] [address]   Culture, blood (routine x 2)     Status: None (Preliminary result)   Collection Time: 01/15/19  5:08 PM   Specimen: BLOOD  Result Value Ref Range Status   Specimen Description BLOOD RIGHT ANTECUBITAL  Final   Special Requests   Final    BOTTLES DRAWN AEROBIC AND ANAEROBIC Blood Culture results may not be optimal due to an inadequate volume of blood received in culture bottles   Culture   Final    NO GROWTH < 24 HOURS Performed at [facility], [place] [address], [place], [place] [address]    Report Status PENDING  Incomplete    Culture, blood (routine x 2)     Status: None (Preliminary result)   Collection Time: 01/15/19  5:13 PM   Specimen: BLOOD LEFT HAND  Result Value Ref Range Status   Specimen Description BLOOD LEFT HAND  Final   Special Requests   Final    BOTTLES DRAWN AEROBIC AND ANAEROBIC Blood Culture results may not be optimal due to an inadequate volume of blood received in culture bottles   Culture   Final    NO GROWTH < 24 HOURS Performed at [facility], [place] [address], [place], [place] [address]    Report Status PENDING  Incomplete      Radiology Studies: US Abdomen Limited Ruq  Result Date: 01/15/2019 CLINICAL DATA:  Right upper quadrant pain EXAM: ULTRASOUND ABDOMEN LIMITED RIGHT UPPER QUADRANT COMPARISON:  None. FINDINGS: Gallbladder: The gallbladder is partially decompressed with apparent wall thickening measuring up to 10 mm. There is a positive sonographic Murphy sign and small amount of pericholecystic fluid. Layering hyperechoic sludge is seen. There is also a echogenic shadowing calculi measuring 1.4 cm. Common bile duct: Diameter: 4.3 mm Liver: No focal lesion identified. Within normal limits in parenchymal echogenicity. Portal vein is patent on color Doppler imaging with normal direction of blood flow towards the liver. Other: None. IMPRESSION: Findings which could be suggestive of acute cholecystitis. Electronically Signed   By: Prudencio Pair M.D.   On: 01/15/2019 13:00     Scheduled Meds:  pantoprazole  40 mg Oral Daily   tamsulosin  0.4 mg Oral Daily   Continuous Infusions:  0.9 % NaCl with KCl 20 mEq / L 100 mL/hr at 01/16/19 0233     LOS: 1 day   Time Spent in minutes   30 minutes  Yordan Martindale D.O. on 01/16/2019 at 8:28 AM  Between 7am to 7pm - Please see pager noted on [email or web address]  After 7pm go to [email or web address]  And look for the night coverage person covering for me after hours  [facility] Office  [phone]

## 2019-01-17 ENCOUNTER — Inpatient Hospital Stay (HOSPITAL_COMMUNITY): Payer: Medicare Other

## 2019-01-17 ENCOUNTER — Encounter (HOSPITAL_COMMUNITY): Payer: Self-pay | Admitting: Diagnostic Radiology

## 2019-01-17 DIAGNOSIS — I480 Paroxysmal atrial fibrillation: Secondary | ICD-10-CM | POA: Diagnosis not present

## 2019-01-17 DIAGNOSIS — Z01810 Encounter for preprocedural cardiovascular examination: Secondary | ICD-10-CM | POA: Diagnosis not present

## 2019-01-17 DIAGNOSIS — I10 Essential (primary) hypertension: Secondary | ICD-10-CM | POA: Diagnosis not present

## 2019-01-17 DIAGNOSIS — K810 Acute cholecystitis: Secondary | ICD-10-CM | POA: Diagnosis not present

## 2019-01-17 DIAGNOSIS — N179 Acute kidney failure, unspecified: Secondary | ICD-10-CM | POA: Diagnosis not present

## 2019-01-17 HISTORY — PX: IR PERC CHOLECYSTOSTOMY: IMG2326

## 2019-01-17 LAB — CBC
HCT: 32 % — ABNORMAL LOW (ref 39.0–52.0)
Hemoglobin: 11.1 g/dL — ABNORMAL LOW (ref 13.0–17.0)
MCH: 30.5 pg (ref 26.0–34.0)
MCHC: 34.7 g/dL (ref 30.0–36.0)
MCV: 87.9 fL (ref 80.0–100.0)
Platelets: 239 10*3/uL (ref 150–400)
RBC: 3.64 MIL/uL — ABNORMAL LOW (ref 4.22–5.81)
RDW: 13.7 % (ref 11.5–15.5)
WBC: 8.1 10*3/uL (ref 4.0–10.5)
nRBC: 0 % (ref 0.0–0.2)

## 2019-01-17 LAB — BASIC METABOLIC PANEL
Anion gap: 10 (ref 5–15)
BUN: 8 mg/dL (ref 8–23)
CO2: 23 mmol/L (ref 22–32)
Calcium: 8.4 mg/dL — ABNORMAL LOW (ref 8.9–10.3)
Chloride: 103 mmol/L (ref 98–111)
Creatinine, Ser: 1.1 mg/dL (ref 0.61–1.24)
GFR calc Af Amer: >60 mL/min (ref >60)
GFR calc non Af Amer: >60 mL/min (ref >60)
Glucose, Bld: 118 mg/dL — ABNORMAL HIGH (ref 70–99)
Potassium: 4 mmol/L (ref 3.5–5.1)
Sodium: 136 mmol/L (ref 135–145)

## 2019-01-17 MED ORDER — MIDAZOLAM HCL 2 MG/2ML IJ SOLN
INTRAMUSCULAR | Status: AC
  Filled 2019-01-17: qty 2

## 2019-01-17 MED ORDER — LIDOCAINE HCL 1 % IJ SOLN
INTRAMUSCULAR | Status: AC
  Filled 2019-01-17: qty 20

## 2019-01-17 MED ORDER — PIPERACILLIN-TAZOBACTAM 3.375 G IVPB 30 MIN
3.3750 g | Freq: Once | INTRAVENOUS | Status: AC
  Administered 2019-01-17: 3.375 g via INTRAVENOUS
  Filled 2019-01-17: qty 50

## 2019-01-17 MED ORDER — LIDOCAINE HCL 1 % IJ SOLN
INTRAMUSCULAR | Status: AC | PRN
  Administered 2019-01-17: 20 mL

## 2019-01-17 MED ORDER — MIDAZOLAM HCL 2 MG/2ML IJ SOLN
INTRAMUSCULAR | Status: AC | PRN
  Administered 2019-01-17: 1 mg via INTRAVENOUS

## 2019-01-17 MED ORDER — SODIUM CHLORIDE 0.9% FLUSH
5.0000 mL | Freq: Three times a day (TID) | INTRAVENOUS | Status: DC
  Administered 2019-01-17 – 2019-01-18 (×3): 5 mL

## 2019-01-17 MED ORDER — HYDROCODONE-ACETAMINOPHEN 5-325 MG PO TABS
1.0000 | ORAL_TABLET | Freq: Four times a day (QID) | ORAL | Status: DC | PRN
  Administered 2019-01-17 – 2019-01-18 (×3): 1 via ORAL
  Filled 2019-01-17 (×3): qty 1

## 2019-01-17 MED ORDER — FENTANYL CITRATE (PF) 100 MCG/2ML IJ SOLN
INTRAMUSCULAR | Status: AC | PRN
  Administered 2019-01-17 (×2): 50 ug via INTRAVENOUS

## 2019-01-17 MED ORDER — IOHEXOL 300 MG/ML  SOLN
50.0000 mL | Freq: Once | INTRAMUSCULAR | Status: AC | PRN
  Administered 2019-01-17: 7 mL

## 2019-01-17 MED ORDER — FENTANYL CITRATE (PF) 100 MCG/2ML IJ SOLN
INTRAMUSCULAR | Status: AC
  Filled 2019-01-17: qty 2

## 2019-01-17 NOTE — Care Management Important Message (Signed)
Important Message  Patient Details  Name: John Livingston MRN: [id number] Date of Birth: August 31, 1940   Medicare Important Message Given:  Yes     Arvilla Salada 01/17/2019, 1:47 PM

## 2019-01-17 NOTE — Progress Notes (Signed)
Subjective No acute events. Still with RUQ pain. Denies any pain at rest  Objective: Vital signs in last 24 hours: Temp:  [98.4 F (36.9 C)-98.9 F (37.2 C)] 98.4 F (36.9 C) (10/30 [id number]) Pulse Rate:  [43-72] 71 (10/30 0614) Resp:  [16-19] 17 (10/30 0614) BP: (119-142)/(46-70) 128/70 (10/30 0614) SpO2:  [93 %-95 %] 94 % (10/30 [id number]) Last BM Date: 01/09/19  Intake/Output from previous day: No intake/output data recorded. Intake/Output this shift: No intake/output data recorded.  Gen: NAD, comfortable CV: RRR Pulm: Normal work of breathing Abd: Soft, mild RUQ tenderness; nondistended Ext: SCDs in place  Lab Results: CBC  Recent Labs    01/16/19 0533 01/17/19 0548  WBC 9.7 8.1  HGB 10.9* 11.1*  HCT 31.1* 32.0*  PLT 212 239   BMET Recent Labs    01/16/19 0533 01/17/19 0548  NA 136 136  K 3.8 4.0  CL 106 103  CO2 23 23  GLUCOSE 116* 118*  BUN 13 8  CREATININE 1.28* 1.10  CALCIUM 8.3* 8.4*   PT/INR Recent Labs    01/16/19 0533  LABPROT 16.4*  INR 1.3*   ABG No results for input(s): PHART, HCO3 in the last 72 hours.  Invalid input(s): PCO2, PO2  Studies/Results:  Anti-infectives: Anti-infectives (From admission, onward)   Start     Dose/Rate Route Frequency Ordered Stop   01/17/19 0900  piperacillin-tazobactam (ZOSYN) IVPB 3.375 g     3.375 g 100 mL/hr over 30 Minutes Intravenous  Once 01/17/19 0853     01/15/19 2030  piperacillin-tazobactam (ZOSYN) IVPB 3.375 g     3.375 g 100 mL/hr over 30 Minutes Intravenous  Once 01/15/19 1310 01/15/19 2111   01/15/19 1315  piperacillin-tazobactam (ZOSYN) IVPB 3.375 g  Status:  Discontinued     3.375 g 100 mL/hr over 30 Minutes Intravenous  Once 01/15/19 1307 01/15/19 1310   01/15/19 1230  piperacillin-tazobactam (ZOSYN) IVPB 3.375 g     3.375 g 100 mL/hr over 30 Minutes Intravenous  Once 01/15/19 1219 01/15/19 1322   01/15/19 1215  cefTRIAXone (ROCEPHIN) 1 g in sodium chloride 0.9 % 100 mL IVPB  Status:   Discontinued     1 g 200 mL/hr over 30 Minutes Intravenous  Once 01/15/19 1202 01/15/19 1218       Assessment/Plan: Patient Active Problem List   Diagnosis Date Noted  . Acute cholecystitis 01/15/2019  . AF (paroxysmal atrial fibrillation) ([place]) 01/15/2019  . Prostate cancer ([place]) 01/15/2019  . Mild intermittent asthma 01/15/2019  . Hypertension 01/15/2019  . AKI (acute kidney injury) ([place]) 01/15/2019  . Hypokalemia 01/15/2019  . Preoperative cardiovascular examination   . Moderate persistent asthma with reflux disease 04/18/2012  . Dysphagia 04/18/2012  . GERD (gastroesophageal reflux disease) 04/18/2012   -IR has been consulted and requested a HIDA - this did not visualize gb, consistent with cholecystitis - planning perc chole tube today; diet as tolerated following   LOS: 2 days   Sharon Mt. Dema Severin, M.D. [facility] Use [email or web address] to contact on call provider

## 2019-01-17 NOTE — Consult Note (Signed)
Chief Complaint: Patient was seen in consultation today for acute cholecsytitis   Referring Physician(s): Dr. Dema Severin  Supervising Physician: Markus Daft  Patient Status: [facility] - In-pt  History of Present Illness: John Livingston is a 78 y.o. male with past medical history of asthma, kidney stones, prostate cancer undergoing radiation, a fib on Eliquis at home who presented to [facility] ED with several days of abdominal pain.  Limited US Abdomen was suggestive of cholecystitis.  He was evaluated by surgery who note stroke and cardiac risk due to recent cardioversion and requested percutaneous cholecystostomy tube placement. Due to the fact that patient had a similar episode several days PTA with a negative CT Abdomen, a HIDA scan was requested.    HIDA 01/16/19: Nonvisualization of gallbladder including after IV morphine. Findings suggest cystic duct obstruction.  Case reviewed by Dr. Anselm Pancoast and patient approved for percutaneous cholecystostomy tube placement.   He is assessed at bedside today.  He has been NPO.  Last dose of Eliquis was 10/28. He states his abdominal pain is improved, but is still present intermittently. He is agreeable to drainage.   Past Medical History:  Diagnosis Date  . Baker's cyst of knee    left  . Cancer of appendix ([place])   . Moderate persistent asthma   . Prostate cancer ([place])   . Ureteral stone     Past Surgical History:  Procedure Laterality Date  . APPENDECTOMY    . cartilage surgery in nose      Allergies: Patient has no known allergies.  Medications: Prior to Admission medications   Medication Sig Start Date End Date Taking? Authorizing Provider  albuterol (PROVENTIL HFA;VENTOLIN HFA) 108 (90 BASE) MCG/ACT inhaler Inhale 2 puffs into the lungs every 6 (six) hours as needed for wheezing or shortness of breath. 04/18/12  Yes Elsie Stain, MD  amLODipine (NORVASC) 5 MG tablet Take 5 mg by mouth daily.    Yes [provider]  apixaban  (ELIQUIS) 2.5 MG TABS tablet Take 1 tablet (2.5 mg total) by mouth 2 (two) times daily. [id number] Yes Delora Fuel, MD  ciprofloxacin (CIPRO) 500 MG tablet Take 1 tablet (500 mg total) by mouth 2 (two) times daily. 01/10/19  Yes Fredia Sorrow, MD  Doxylamine Succinate, Sleep, (SLEEP AID PO) Take 2 tablets by mouth at bedtime.   Yes [provider]  hydrochlorothiazide (HYDRODIURIL) 25 MG tablet Take 1 tablet (25 mg total) by mouth daily. 04/30/13  Yes Palumbo, April, MD  metroNIDAZOLE (FLAGYL) 500 MG tablet Take 1 tablet (500 mg total) by mouth 3 (three) times daily for 7 days. 01/10/19 01/17/19 Yes Fredia Sorrow, MD  omeprazole (PRILOSEC) 20 MG capsule Take 1 capsule (20 mg total) by mouth daily. 04/18/12  Yes Elsie Stain, MD  tamsulosin (FLOMAX) 0.4 MG CAPS capsule Take 0.4 mg by mouth.   Yes [provider]  TRAVATAN Z 0.004 % SOLN ophthalmic solution Place 1 drop into both eyes 3 (three) times daily.    Yes [provider]  HYDROcodone-acetaminophen (NORCO/VICODIN) 5-325 MG tablet Take 1 tablet by mouth every 6 (six) hours as needed for moderate pain. Patient not taking: Reported on 01/15/2019 01/10/19   Fredia Sorrow, MD  ondansetron (ZOFRAN ODT) 4 MG disintegrating tablet Take 1 tablet (4 mg total) by mouth every 8 (eight) hours as needed. 01/10/19   Fredia Sorrow, MD     Family History  Problem Relation Age of Onset  . Asthma Son  as a child  . Heart disease Mother   . Prostate cancer Father     Social History   Socioeconomic History  . Marital status: Single    Spouse name: Not on file  . Number of children: Not on file  . Years of education: Not on file  . Highest education level: Not on file  Occupational History  . Occupation: IT trainer    Comment: [employer]  Social Needs  . Financial resource strain: Not on file  . Food insecurity    Worry: Not on file    Inability: Not on file  . Transportation needs     Medical: Not on file    Non-medical: Not on file  Tobacco Use  . Smoking status: Former Smoker    Packs/day: 1.00    Years: 20.00    Pack years: 20.00    Types: Cigarettes    Quit date: 03/20/1978    Years since quitting: 40.8  . Smokeless tobacco: Never Used  Substance and Sexual Activity  . Alcohol use: Yes    Alcohol/week: 1.0 standard drinks    Types: 1 Cans of beer per week    Comment: occ  . Drug use: No  . Sexual activity: Not on file  Lifestyle  . Physical activity    Days per week: Not on file    Minutes per session: Not on file  . Stress: Not on file  Relationships  . Social Herbalist on phone: Not on file    Gets together: Not on file    Attends religious service: Not on file    Active member of club or organization: Not on file    Attends meetings of clubs or organizations: Not on file    Relationship status: Not on file  Other Topics Concern  . Not on file  Social History Narrative  . Not on file     Review of Systems: A 12 point ROS discussed and pertinent positives are indicated in the HPI above.  All other systems are negative.  Review of Systems  Constitutional: Negative for fatigue and fever.  Respiratory: Negative for cough and shortness of breath.   Cardiovascular: Negative for chest pain.  Gastrointestinal: Positive for abdominal pain. Negative for nausea and vomiting.  Genitourinary: Negative for dysuria.  Musculoskeletal: Negative for back pain.  Psychiatric/Behavioral: Negative for behavioral problems and confusion.    Vital Signs: BP 128/70 (BP Location: Right Arm)   Pulse 71   Temp 98.4 F (36.9 C) (Oral)   Resp 17   Ht 6\' 3"  (1.905 m)   Wt 183 lb 3.2 oz (83.1 kg)   SpO2 94%   BMI 22.90 kg/m   Physical Exam Vitals signs and nursing note reviewed.  Constitutional:      Appearance: He is well-developed.  Cardiovascular:     Rate and Rhythm: Normal rate and regular rhythm.     Heart sounds: No murmur. No gallop.    Pulmonary:     Effort: Pulmonary effort is normal. No respiratory distress.     Breath sounds: Normal breath sounds.  Abdominal:     Tenderness: There is abdominal tenderness in the right upper quadrant.  Skin:    General: Skin is warm and dry.  Neurological:     General: No focal deficit present.     Mental Status: He is alert and oriented to person, place, and time.  Psychiatric:        Mood and  Affect: Mood normal.        Behavior: Behavior normal.      MD Evaluation Airway: WNL Heart: WNL Abdomen: WNL Chest/ Lungs: WNL ASA  Classification: 3 Mallampati/Airway Score: Two   Imaging: Dg Chest 2 View  Result Date: 01/10/2019 CLINICAL DATA:  Chest pain EXAM: CHEST - 2 VIEW COMPARISON:  01/28/2018 FINDINGS: The heart size and mediastinal contours are within normal limits. Both lungs are clear. The visualized skeletal structures are unremarkable. IMPRESSION: No active cardiopulmonary disease. Electronically Signed   By: Donavan Foil M.D.   On: 01/10/2019 18:48   Nm Hepatobiliary Liver Func  Result Date: 01/16/2019 CLINICAL DATA:  Right upper quadrant pain rule out cholecystitis EXAM: NUCLEAR MEDICINE HEPATOBILIARY IMAGING TECHNIQUE: Sequential images of the abdomen were obtained out to 60 minutes following intravenous administration of radiopharmaceutical. RADIOPHARMACEUTICALS:  5.1 mCi Tc-38m  Choletec IV COMPARISON:  Ultrasound abdomen 01/15/2019 FINDINGS: Prompt uptake by the liver. Early visualization of the duodenum and small bowel. No filling of the gallbladder. The patient received 3 mg of morphine IV. Subsequent imaging reveals no gallbladder visualization. IMPRESSION: Nonvisualization of gallbladder including after IV morphine. Findings suggest cystic duct obstruction. Electronically Signed   By: Franchot Gallo M.D.   On: 01/16/2019 18:24   Ct Abdomen Pelvis W Contrast  Result Date: 01/10/2019 CLINICAL DATA:  78 year old male with abdominal pain. Concern for acute  diverticulitis. EXAM: CT ABDOMEN AND PELVIS WITH CONTRAST TECHNIQUE: Multidetector CT imaging of the abdomen and pelvis was performed using the standard protocol following bolus administration of intravenous contrast. CONTRAST:  158mL OMNIPAQUE IOHEXOL 300 MG/ML  SOLN COMPARISON:  CT of the abdomen pelvis dated 10/31/2018 FINDINGS: Lower chest: There are bibasilar dependent atelectatic changes. The visualized lung bases are otherwise clear. Atherosclerotic calcification of the aortic valve. No intra-abdominal free air or free fluid. Hepatobiliary: Slight irregularity of the liver contour. There is mild intrahepatic biliary ductal dilatation versus less likely mild periportal edema. There are small stones or sludge ball within the gallbladder. No pericholecystic fluid or evidence of acute cholecystitis by CT. Pancreas: Unremarkable. No pancreatic ductal dilatation or surrounding inflammatory changes. Spleen: Normal in size without focal abnormality. Adrenals/Urinary Tract: The adrenal glands are unremarkable. There is a 4 mm nonobstructing right renal upper pole calculus. A 2.1 cm right renal upper pole hypodense lesion is not well characterized on this CT but appears similar to prior CT. Further evaluation with MRI as previously recommended. There is no hydronephrosis on either side. There is symmetric enhancement and excretion of contrast by both kidneys. The visualized ureters and urinary bladder appear unremarkable. Stomach/Bowel: There is a 3 cm duodenal diverticulum. No associated inflammatory changes. There is sigmoid diverticulosis as well as scattered colonic diverticula. Mild perisigmoid stranding may be chronic and scarring or represent mild acute diverticulitis. Clinical correlation is recommended. No diverticular abscess or perforation. There is no bowel obstruction. Appendectomy. Vascular/Lymphatic: Advanced atherosclerotic calcification of the aorta. 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 This is similar to prior CT. The IVC is unremarkable. No portal venous gas. There is no adenopathy. Reproductive: Mildly enlarged prostate gland with median lobe hypertrophy measuring approximately 5 cm in transverse axial diameter. Prostate fiducial markers noted. Other: Small fat containing umbilical hernia.0 Musculoskeletal: Mild degenerative changes of the spine. No acute osseous pathology. IMPRESSION: 1. Colonic diverticulosis. Mild perisigmoid stranding may be chronic or represent mild acute diverticulitis. Clinical correlation is recommended. No diverticular abscess or perforation. 2. Small gallstones.  No evidence of acute  cholecystitis by CT. 3. 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 Follow-up as previously recommended. 4. Mildly enlarged prostate gland with median lobe hypertrophy. Aortic Atherosclerosis (ICD10-I70.0). Electronically Signed   By: Anner Crete M.D.   On: 01/10/2019 21:32   Dg Chest Port 1 View  Result Date: 01/13/2019 CLINICAL DATA:  Weakness EXAM: PORTABLE CHEST 1 VIEW COMPARISON:  01/10/2019 FINDINGS: Cardiac shadow is within normal limits. Aortic calcifications are seen. Mild right basilar atelectasis is noted. No focal confluent infiltrate or effusion is seen. No bony abnormality is noted. IMPRESSION: Right basilar atelectasis. Electronically Signed   By: Inez Catalina M.D.   On: 01/13/2019 02:08   US Abdomen Limited Ruq  Result Date: 01/15/2019 CLINICAL DATA:  Right upper quadrant pain EXAM: ULTRASOUND ABDOMEN LIMITED RIGHT UPPER QUADRANT COMPARISON:  None. FINDINGS: Gallbladder: The gallbladder is partially decompressed with apparent wall thickening measuring up to 10 mm. There is a positive sonographic Murphy sign and small amount of pericholecystic fluid. Layering hyperechoic sludge is seen. There is also a echogenic shadowing calculi measuring 1.4 cm. Common bile duct: Diameter: 4.3 mm Liver: No focal lesion identified. Within normal  limits in parenchymal echogenicity. Portal vein is patent on color Doppler imaging with normal direction of blood flow towards the liver. Other: None. IMPRESSION: Findings which could be suggestive of acute cholecystitis. Electronically Signed   By: Prudencio Pair M.D.   On: 01/15/2019 13:00    Labs:  CBC: Recent Labs    01/13/19 0220 01/15/19 1101 01/16/19 0533 01/17/19 0548  WBC 14.2* 10.1 9.7 8.1  HGB 13.1 11.9* 10.9* 11.1*  HCT 37.0* 33.7* 31.1* 32.0*  PLT 150 207 212 239    COAGS: Recent Labs    01/16/19 0533  INR 1.3*    BMP: Recent Labs    01/13/19 0220 01/15/19 1101 01/16/19 0533 01/17/19 0548  NA 129* 134* 136 136  K 4.4 3.4* 3.8 4.0  CL 93* 101 106 103  CO2 23 25 23 23   GLUCOSE 126* 127* 116* 118*  BUN 22 19 13 8   CALCIUM 8.6* 8.7* 8.3* 8.4*  CREATININE 1.70* 1.46* 1.28* 1.10  GFRNONAA 37* 45* 53* >60  GFRAA 43* 52* >60 >60    LIVER FUNCTION TESTS: Recent Labs    01/10/19 1825 01/13/19 0220 01/15/19 1101 01/16/19 0533  BILITOT 0.7 2.0* 0.8 0.9  AST 21 33 15 17  ALT 15 23 14 14   ALKPHOS 54 42 58 78  PROT 6.4* 6.4* 6.3* 5.8*  ALBUMIN 4.1 3.3* 2.9* 2.6*    TUMOR MARKERS: No results for input(s): AFPTM, CEA, CA199, CHROMGRNA in the last 8760 hours.  Assessment and Plan: Acute cholecystitis Patient with RUQ pain consistent with cholecystitis.  HIDA positive for non-visualization of the gall bladder.  He is afebrile.  Vital signs stable.  WBC improved to 8.1. His Eliquis has been held.  Plan for percutaneous cholecystostomy tube placement today.   Risks and benefits discussed with the patient including, but not limited to bleeding, infection, gallbladder perforation, bile leak, sepsis or even death.  All of the patient's questions were answered, patient is agreeable to proceed. Consent signed and in chart.  Thank you for this interesting consult.  I greatly enjoyed meeting TRICE SCHMOCK and look forward to participating in their care.  A  copy of this report was sent to the requesting provider on this date.  Electronically Signed: Docia Barrier, PA 01/17/2019, 8:55 AM   I spent a total of 40  Minutes    in face to face in clinical consultation, greater than 50% of which was counseling/coordinating care for acute cholecystitis.

## 2019-01-17 NOTE — Procedures (Addendum)
Interventional Radiology Procedure:   Indications: Acute cholecystitis  Procedure: Image guided percutaneous cholecystostomy tube placement  Findings: Large amount of gas in gallbladder. Placed 10 Fr drain and aspirated dark bile and gas.  Possible free air present after tube placement.   Complications: None     EBL: less than 10 ml  Plan: Will get CT abdomen to confirm that tube is ONLY in gallbladder and no bowel involvement.  If there is free air, likely air that escaped from gallbladder during drain placement.  Send fluid for culture.     Elleana Stillson R. Anselm Pancoast, MD  Pager: [phone]

## 2019-01-17 NOTE — Progress Notes (Signed)
PROGRESS NOTE    John Livingston  [id number] DOB: 1940-07-15 DOA: 01/15/2019 PCP: John Na., MD   Brief Narrative:  HPI on 01/15/2019 by Dr. Early Osmond John Livingston is a 78 y.o. male with medical history significant of prostate cancer-currently on radiation therapy, GERD, well-controlled asthma, new onset A. Fib-on Eliquis presents to emergency department due to worsening right upper quadrant pain since 6 days.  Reports pain is 10 out of 10, sometimes radiates to his back, aggravates with movements and eating food, relieves with nothing, denies association with nausea, vomiting, melena, hematemesis, fever, chills, pruritus, change in skin or urine color.  Had bowel movement 6 days ago.  His appetite is somewhat decreased due to pain however denies weight loss, night sweats, over-the-counter use of NSAIDs.  Patient evaluated in ED last week with similar symptoms and CT abdomen showed: Colonic diverticulosis, small gallstones, andmild intrahepatic biliary ductal dilatation and was discharged home on Cipro and Flagyl.   On 01/13/2019 patient was very weak and having heart palpitation-family called 911 and patient was found to be in A. fib with RVR with hypotension.  Patient was cardioverted in route to the emergency department.  Patient went back to sinus rhythm and was feeling much better upon arrival to the ER.  Patient started on Eliquis and discharged home in stable condition.  He evaluated by cardiology yesterday.  He took Eliquis last night.  He has been taking hydrocodone for right upper quadrant pain/epigastric pain with no help at all.  He has been taking his Cipro and Flagyl as prescribed and finish the course yesterday.  He was seen by Dr. Ninfa Linden this morning and due to constant abdominal pain he was advised to go to the ER for further evaluation and management.  He lives with his wife and denies smoking, alcohol, illicit drug use.  He is getting radiation therapy 5  days in a week from M-F.  His last radiation therapy was on last Friday.  Assessment & Plan   Acute cholecystitis -RUQ ultrasound showed findings suggestive of acute cholecystitis -Currently afebrile with no leukocytosis -LFTs and lipase within normal limits -Blood culture showed no growth to date -Continue IV Zosyn, IV fluids -Currently n.p.o. -Continue morphine as needed for pain control although patient does state that hydrocodone seems to help his pain better -General surgery consulted and appreciated -Cardiology also consulted for preoperative assessment, recommended patient wait approximately 1 month if possible.  However if surgery is needed immediately, would bridge with heparin. -HIDA: Nonvisualization of gallbladder including after IV morphine.  Findings suggest cystic duct obstruction. -Interventional radiology consulted and appreciated- planning for perc chole drain today  New onset atrial fibrillation -Status post cardioversion on 01/13/2019 -As above Eliquis held -Patient currently in sinus rhythm -Cardiology consulted and appreciated- recommended restarting Eliquis 2.5mg  BID (age/renally adjusted) when possible   Essential hypertension -Amlodipine and HCTZ currently held -BP stable -Continue labetalol PRN  Hypokalemia -resolved, continue to monitor BMP  Acute kidney injury -Creatinine improving, currently down to 1.1 (1.46 on admission), baseline approximate 1.1 -Continue to monitor BMP  Prostate cancer -Last radiation therapy was on 01/10/2019  Mild intermittent asthma -Currently stable, no wheezing on exam -Continue albuterol as needed  DVT Prophylaxis  SCDs  Code Status: Full  Family Communication: None at bedside  Disposition Plan: Admitted, pending further surgical recommendations  Consultants General surgery Cardiology Interventional radiology  Procedures  HIDA  Antibiotics   Anti-infectives (From admission, onward)   Start  Dose/Rate Route Frequency Ordered Stop   01/15/19 2030  piperacillin-tazobactam (ZOSYN) IVPB 3.375 g     3.375 g 100 mL/hr over 30 Minutes Intravenous  Once 01/15/19 1310 01/15/19 2111   01/15/19 1315  piperacillin-tazobactam (ZOSYN) IVPB 3.375 g  Status:  Discontinued     3.375 g 100 mL/hr over 30 Minutes Intravenous  Once 01/15/19 1307 01/15/19 1310   01/15/19 1230  piperacillin-tazobactam (ZOSYN) IVPB 3.375 g     3.375 g 100 mL/hr over 30 Minutes Intravenous  Once 01/15/19 1219 01/15/19 1322   01/15/19 1215  cefTRIAXone (ROCEPHIN) 1 g in sodium chloride 0.9 % 100 mL IVPB  Status:  Discontinued     1 g 200 mL/hr over 30 Minutes Intravenous  Once 01/15/19 1202 01/15/19 1218      Subjective:   John Livingston seen and examined today.  Patient continues to have abdominal pain but states it is mildly improved since coming to the hospital.  Would like to go home soon.  Currently laying on the couch as the bed hurts his back.  Denies current chest pain, shortness of breath, nausea or vomiting, dizziness or headache. Complains of constipation. Objective:   Vitals:   01/16/19 1159 01/16/19 1556 01/16/19 2323 01/17/19 0614  BP: (!) 120/46 (!) 142/62 (!) 119/48 128/70  Pulse: (!) 43 70 72 71  Resp: 16  19 17   Temp: 98.5 F (36.9 C)  98.9 F (37.2 C) 98.4 F (36.9 C)  TempSrc: Oral  Oral Oral  SpO2: 95%  93% 94%  Weight:      Height:       No intake or output data in the 24 hours ending 01/17/19 0807 Filed Weights   01/15/19 1705  Weight: 83.1 kg   Exam  General: Well developed, well nourished, NAD, appears stated age  25: NCAT, mucous membranes moist.   Cardiovascular: S1 S2 auscultated, RRR, no murmur  Respiratory: Clear to auscultation bilaterally   Abdomen: Soft, mildly TTP, nondistended, + bowel sounds  Extremities: warm dry without cyanosis clubbing or edema  Neuro: AAOx3, nonfocal  Psych: Pleasant, appropriate mood and affect  Data Reviewed: I have personally  reviewed following labs and imaging studies  CBC: Recent Labs  Lab 01/10/19 1826 01/13/19 0220 01/15/19 1101 01/16/19 0533 01/17/19 0548  WBC 5.0 14.2* 10.1 9.7 8.1  NEUTROABS  --  12.4* 7.8*  --   --   HGB 12.5* 13.1 11.9* 10.9* 11.1*  HCT 36.4* 37.0* 33.7* 31.1* 32.0*  MCV 89.4 88.3 88.5 88.4 87.9  PLT 189 150 207 212 [id number]   Basic Metabolic Panel: Recent Labs  Lab 01/10/19 1825 01/13/19 0220 01/15/19 1101 01/15/19 1713 01/16/19 0533 01/17/19 0548  NA 137 129* 134*  --  136 136  K 3.8 4.4 3.4*  --  3.8 4.0  CL 103 93* 101  --  106 103  CO2 24 23 25   --  23 23  GLUCOSE 155* 126* 127*  --  116* 118*  BUN 18 22 19   --  13 8  CREATININE 1.36* 1.70* 1.46*  --  1.28* 1.10  CALCIUM 9.1 8.6* 8.7*  --  8.3* 8.4*  MG  --  2.0  --  2.1  --   --   PHOS  --   --   --  3.6  --   --    GFR: Estimated Creatinine Clearance: 63 mL/min (by C-G formula based on SCr of 1.1 mg/dL). Liver Function Tests: Recent Labs  Lab 01/10/19  1825 01/13/19 0220 01/15/19 1101 01/16/19 0533  AST 21 33 15 17  ALT 15 23 14 14   ALKPHOS 54 42 58 78  BILITOT 0.7 2.0* 0.8 0.9  PROT 6.4* 6.4* 6.3* 5.8*  ALBUMIN 4.1 3.3* 2.9* 2.6*   Recent Labs  Lab 01/10/19 1826 01/13/19 0220 01/15/19 1713  LIPASE 49 20 28   No results for input(s): AMMONIA in the last 168 hours. Coagulation Profile: Recent Labs  Lab 01/16/19 0533  INR 1.3*   Cardiac Enzymes: No results for input(s): CKTOTAL, CKMB, CKMBINDEX, TROPONINI in the last 168 hours. BNP (last 3 results) No results for input(s): PROBNP in the last 8760 hours. HbA1C: No results for input(s): HGBA1C in the last 72 hours. CBG: Recent Labs  Lab 01/13/19 0137  GLUCAP 116*   Lipid Profile: No results for input(s): CHOL, HDL, LDLCALC, TRIG, CHOLHDL, LDLDIRECT in the last 72 hours. Thyroid Function Tests: No results for input(s): TSH, T4TOTAL, FREET4, T3FREE, THYROIDAB in the last 72 hours. Anemia Panel: No results for input(s): VITAMINB12,  FOLATE, FERRITIN, TIBC, IRON, RETICCTPCT in the last 72 hours. Urine analysis:    Component Value Date/Time   COLORURINE YELLOW 01/10/2019 1846   APPEARANCEUR CLEAR 01/10/2019 1846   LABSPEC 1.025 01/10/2019 1846   PHURINE 5.5 01/10/2019 1846   GLUCOSEU NEGATIVE 01/10/2019 1846   HGBUR NEGATIVE 01/10/2019 1846   BILIRUBINUR NEGATIVE 01/10/2019 1846   KETONESUR NEGATIVE 01/10/2019 1846   PROTEINUR NEGATIVE 01/10/2019 1846   UROBILINOGEN 0.2 08/02/2013 1122   NITRITE NEGATIVE 01/10/2019 1846   LEUKOCYTESUR NEGATIVE 01/10/2019 1846   Sepsis Labs: @LABRCNTIP (procalcitonin:4,lacticidven:4)  ) Recent Results (from the past 240 hour(s))  SARS Coronavirus 2 by RT PCR (hospital order, performed in [place] hospital lab) Nasopharyngeal Nasopharyngeal Swab     Status: None   Collection Time: 01/15/19 11:18 AM   Specimen: Nasopharyngeal Swab  Result Value Ref Range Status   SARS Coronavirus 2 NEGATIVE NEGATIVE Final    Comment: (NOTE) If result is NEGATIVE SARS-CoV-2 target nucleic acids are NOT DETECTED. The SARS-CoV-2 RNA is generally detectable in upper and lower  respiratory specimens during the acute phase of infection. The lowest  concentration of SARS-CoV-2 viral copies this assay can detect is 250  copies / mL. A negative result does not preclude SARS-CoV-2 infection  and should not be used as the sole basis for treatment or other  patient management decisions.  A negative result may occur with  improper specimen collection / handling, submission of specimen other  than nasopharyngeal swab, presence of viral mutation(s) within the  areas targeted by this assay, and inadequate number of viral copies  (<250 copies / mL). A negative result must be combined with clinical  observations, patient history, and epidemiological information. If result is POSITIVE SARS-CoV-2 target nucleic acids are DETECTED. The SARS-CoV-2 RNA is generally detectable in upper and lower  respiratory  specimens dur ing the acute phase of infection.  Positive  results are indicative of active infection with SARS-CoV-2.  Clinical  correlation with patient history and other diagnostic information is  necessary to determine patient infection status.  Positive results do  not rule out bacterial infection or co-infection with other viruses. If result is PRESUMPTIVE POSTIVE SARS-CoV-2 nucleic acids MAY BE PRESENT.   A presumptive positive result was obtained on the submitted specimen  and confirmed on repeat testing.  While 2019 novel coronavirus  (SARS-CoV-2) nucleic acids may be present in the submitted sample  additional confirmatory testing may be necessary for  epidemiological  and / or clinical management purposes  to differentiate between  SARS-CoV-2 and other Sarbecovirus currently known to infect humans.  If clinically indicated additional testing with an alternate test  methodology [phone]) is advised. The SARS-CoV-2 RNA is generally  detectable in upper and lower respiratory sp ecimens during the acute  phase of infection. The expected result is Negative. Fact Sheet for Patients:  StrictlyIdeas.no Fact Sheet for Healthcare Providers: BankingDealers.co.za This test is not yet approved or cleared by the [place] FDA and has been authorized for detection and/or diagnosis of SARS-CoV-2 by FDA under an Emergency Use Authorization (EUA).  This EUA will remain in effect (meaning this test can be used) for the duration of the COVID-19 declaration under Section 564(b)(1) of the Act, 21 U.S.C. section 360bbb-3(b)(1), unless the authorization is terminated or revoked sooner. Performed at [facility], [place] [address] [place], [place] [address]   Culture, blood (routine x 2)     Status: None (Preliminary result)   Collection Time: 01/15/19  5:08 PM   Specimen: BLOOD  Result Value Ref Range Status   Specimen Description BLOOD  RIGHT ANTECUBITAL  Final   Special Requests   Final    BOTTLES DRAWN AEROBIC AND ANAEROBIC Blood Culture results may not be optimal due to an inadequate volume of blood received in culture bottles   Culture   Final    NO GROWTH 2 DAYS Performed at [facility], [place] [address] [place], [place] [address]    Report Status PENDING  Incomplete  Culture, blood (routine x 2)     Status: None (Preliminary result)   Collection Time: 01/15/19  5:13 PM   Specimen: BLOOD LEFT HAND  Result Value Ref Range Status   Specimen Description BLOOD LEFT HAND  Final   Special Requests   Final    BOTTLES DRAWN AEROBIC AND ANAEROBIC Blood Culture results may not be optimal due to an inadequate volume of blood received in culture bottles   Culture   Final    NO GROWTH 2 DAYS Performed at [facility], [place] [address], [place], [place] [address]    Report Status PENDING  Incomplete      Radiology Studies: Nm Hepatobiliary Liver Func  Result Date: 01/16/2019 CLINICAL DATA:  Right upper quadrant pain rule out cholecystitis EXAM: NUCLEAR MEDICINE HEPATOBILIARY IMAGING TECHNIQUE: Sequential images of the abdomen were obtained out to 60 minutes following intravenous administration of radiopharmaceutical. RADIOPHARMACEUTICALS:  5.1 mCi Tc-66m  Choletec IV COMPARISON:  Ultrasound abdomen 01/15/2019 FINDINGS: Prompt uptake by the liver. Early visualization of the duodenum and small bowel. No filling of the gallbladder. The patient received 3 mg of morphine IV. Subsequent imaging reveals no gallbladder visualization. IMPRESSION: Nonvisualization of gallbladder including after IV morphine. Findings suggest cystic duct obstruction. Electronically Signed   By: Franchot Gallo M.D.   On: 01/16/2019 18:24   US Abdomen Limited Ruq  Result Date: 01/15/2019 CLINICAL DATA:  Right upper quadrant pain EXAM: ULTRASOUND ABDOMEN LIMITED RIGHT UPPER QUADRANT COMPARISON:  None. FINDINGS: Gallbladder: The gallbladder is  partially decompressed with apparent wall thickening measuring up to 10 mm. There is a positive sonographic Murphy sign and small amount of pericholecystic fluid. Layering hyperechoic sludge is seen. There is also a echogenic shadowing calculi measuring 1.4 cm. Common bile duct: Diameter: 4.3 mm Liver: No focal lesion identified. Within normal limits in parenchymal echogenicity. Portal vein is patent on color Doppler imaging with normal direction of blood flow  towards the liver. Other: None. IMPRESSION: Findings which could be suggestive of acute cholecystitis. Electronically Signed   By: Prudencio Pair M.D.   On: 01/15/2019 13:00     Scheduled Meds:  pantoprazole  40 mg Oral Daily   polyethylene glycol  17 g Oral Daily   tamsulosin  0.4 mg Oral Daily   Continuous Infusions:  0.9 % NaCl with KCl 20 mEq / L 100 mL/hr at 01/16/19 1204     LOS: 2 days   Time Spent in minutes   30 minutes  Kahlen Boyde D.O. on 01/17/2019 at 8:07 AM  Between 7am to 7pm - Please see pager noted on [email or web address]  After 7pm go to [email or web address]  And look for the night coverage person covering for me after hours  [facility] Office  [phone]

## 2019-01-17 NOTE — Progress Notes (Signed)
Progress Note  Patient Name: John Livingston Date of Encounter: 01/17/2019  Primary Cardiologist: No primary care provider on file. Pt has been seen only in the afib clinic  Subjective   The patient is laying on the sofa as the bed hurts his back.  He still has abdominal pain although better than yesterday.  No chest pain, shortness of breath or palpitations.  Inpatient Medications    Scheduled Meds:  pantoprazole  40 mg Oral Daily   polyethylene glycol  17 g Oral Daily   tamsulosin  0.4 mg Oral Daily   Continuous Infusions:  0.9 % NaCl with KCl 20 mEq / L 100 mL/hr at 01/16/19 1204   PRN Meds: acetaminophen **OR** acetaminophen, albuterol, labetalol, morphine injection, ondansetron **OR** ondansetron (ZOFRAN) IV   Vital Signs    Vitals:   01/16/19 1159 01/16/19 1556 01/16/19 2323 01/17/19 0614  BP: (!) 120/46 (!) 142/62 (!) 119/48 128/70  Pulse: (!) 43 70 72 71  Resp: 16  19 17   Temp: 98.5 F (36.9 C)  98.9 F (37.2 C) 98.4 F (36.9 C)  TempSrc: Oral  Oral Oral  SpO2: 95%  93% 94%  Weight:      Height:       No intake or output data in the 24 hours ending 01/17/19 0737 Last 3 Weights 01/15/2019 01/14/2019 01/10/2019  Weight (lbs) 183 lb 3.2 oz 182 lb 9.6 oz 180 lb  Weight (kg) 83.1 kg 82.827 kg 81.647 kg      Telemetry    Sinus rhythm in the 70's with PACs - Personally Reviewed  ECG    No new tracings for review  Physical Exam   GEN: No acute distress.   Neck: No JVD Cardiac: RRR, no murmurs, rubs, or gallops.  Respiratory: Clear to auscultation bilaterally. GI: Soft, nontender, non-distended  MS: No edema; No deformity. Neuro:  Nonfocal  Psych: Normal affect   Labs    High Sensitivity Troponin:   Recent Labs  Lab 01/10/19 1826 01/10/19 2035  TROPONINIHS 2 2      Chemistry Recent Labs  Lab 01/13/19 0220 01/15/19 1101 01/16/19 0533 01/17/19 0548  NA 129* 134* 136 136  K 4.4 3.4* 3.8 4.0  CL 93* 101 106 103  CO2 23 25 23 23     GLUCOSE 126* 127* 116* 118*  BUN 22 19 13 8   CREATININE 1.70* 1.46* 1.28* 1.10  CALCIUM 8.6* 8.7* 8.3* 8.4*  PROT 6.4* 6.3* 5.8*  --   ALBUMIN 3.3* 2.9* 2.6*  --   AST 33 15 17  --   ALT 23 14 14   --   ALKPHOS 42 58 78  --   BILITOT 2.0* 0.8 0.9  --   GFRNONAA 37* 45* 53* >60  GFRAA 43* 52* >60 >60  ANIONGAP 13 8 7 10      Hematology Recent Labs  Lab 01/15/19 1101 01/16/19 0533 01/17/19 0548  WBC 10.1 9.7 8.1  RBC 3.81* 3.52* 3.64*  HGB 11.9* 10.9* 11.1*  HCT 33.7* 31.1* 32.0*  MCV 88.5 88.4 87.9  MCH 31.2 31.0 30.5  MCHC 35.3 35.0 34.7  RDW 13.7 13.7 13.7  PLT 207 212 239    BNPNo results for input(s): BNP, PROBNP in the last 168 hours.   DDimer  Recent Labs  Lab 01/10/19 2211  DDIMER <0.27     Radiology    Nm Hepatobiliary Liver Func  Result Date: 01/16/2019 CLINICAL DATA:  Right upper quadrant pain rule out cholecystitis EXAM: NUCLEAR  MEDICINE HEPATOBILIARY IMAGING TECHNIQUE: Sequential images of the abdomen were obtained out to 60 minutes following intravenous administration of radiopharmaceutical. RADIOPHARMACEUTICALS:  5.1 mCi Tc-43m  Choletec IV COMPARISON:  Ultrasound abdomen 01/15/2019 FINDINGS: Prompt uptake by the liver. Early visualization of the duodenum and small bowel. No filling of the gallbladder. The patient received 3 mg of morphine IV. Subsequent imaging reveals no gallbladder visualization. IMPRESSION: Nonvisualization of gallbladder including after IV morphine. Findings suggest cystic duct obstruction. Electronically Signed   By: Franchot Gallo M.D.   On: 01/16/2019 18:24   US Abdomen Limited Ruq  Result Date: 01/15/2019 CLINICAL DATA:  Right upper quadrant pain EXAM: ULTRASOUND ABDOMEN LIMITED RIGHT UPPER QUADRANT COMPARISON:  None. FINDINGS: Gallbladder: The gallbladder is partially decompressed with apparent wall thickening measuring up to 10 mm. There is a positive sonographic Murphy sign and small amount of pericholecystic fluid. Layering  hyperechoic sludge is seen. There is also a echogenic shadowing calculi measuring 1.4 cm. Common bile duct: Diameter: 4.3 mm Liver: No focal lesion identified. Within normal limits in parenchymal echogenicity. Portal vein is patent on color Doppler imaging with normal direction of blood flow towards the liver. Other: None. IMPRESSION: Findings which could be suggestive of acute cholecystitis. Electronically Signed   By: Prudencio Pair M.D.   On: 01/15/2019 13:00    Cardiac Studies    Patient Profile     78 y.o. male with a hx of HTN, GERD, prostate cancer undergoing radiation, and recently diagnosed afib s/p DCCV 01/13/19 on Eliquis who is being seen for the evaluation of cardiac pre-operative evaluation for acute cholycystitis at the request of Dr. Doristine Bosworth.  Assessment & Plan    Cholecystitis -Given his recent cardioversion on 10/26, would ideally recommend anticoagulation for 1 month before being able to hold for surgery.  The surgeon has agreed with placement of percutaneous Chole drain which would address the cholecystitis adequately.  He is for placement today per IR.  It appears that his anticoagulation is currently on hold for procedure. Last dose of Eliquis was noted to be 10/28 pm. Would restart as soon as possible.   Recent onset Atrial fibrillation -S/p DCCV on 01/13/2019.  Now maintaining sinus rhythm with PACs.  Rate is controlled in the 70s. -Patient is anticoagulated at home with Eliquis 2.5 mg twice daily (reduced dose for renal function and age). Restart anticoagulation as soon as safe.       For questions or updates, please contact [place] Please consult [email or web address] for contact info under        Signed, Daune Perch, NP  01/17/2019, 7:37 AM

## 2019-01-17 NOTE — Progress Notes (Signed)
Pt states morphine is not covering his pain Pt states he takes norco at home and that helps  Informed MD via secure chat

## 2019-01-18 DIAGNOSIS — I480 Paroxysmal atrial fibrillation: Secondary | ICD-10-CM | POA: Diagnosis not present

## 2019-01-18 DIAGNOSIS — I10 Essential (primary) hypertension: Secondary | ICD-10-CM | POA: Diagnosis not present

## 2019-01-18 DIAGNOSIS — K810 Acute cholecystitis: Secondary | ICD-10-CM | POA: Diagnosis not present

## 2019-01-18 DIAGNOSIS — N179 Acute kidney failure, unspecified: Secondary | ICD-10-CM | POA: Diagnosis not present

## 2019-01-18 LAB — CBC
HCT: 30.8 % — ABNORMAL LOW (ref 39.0–52.0)
Hemoglobin: 10.6 g/dL — ABNORMAL LOW (ref 13.0–17.0)
MCH: 30.8 pg (ref 26.0–34.0)
MCHC: 34.4 g/dL (ref 30.0–36.0)
MCV: 89.5 fL (ref 80.0–100.0)
Platelets: 277 10*3/uL (ref 150–400)
RBC: 3.44 MIL/uL — ABNORMAL LOW (ref 4.22–5.81)
RDW: 13.7 % (ref 11.5–15.5)
WBC: 5.1 10*3/uL (ref 4.0–10.5)
nRBC: 0 % (ref 0.0–0.2)

## 2019-01-18 LAB — BASIC METABOLIC PANEL
Anion gap: 12 (ref 5–15)
BUN: 6 mg/dL — ABNORMAL LOW (ref 8–23)
CO2: 22 mmol/L (ref 22–32)
Calcium: 8.5 mg/dL — ABNORMAL LOW (ref 8.9–10.3)
Chloride: 106 mmol/L (ref 98–111)
Creatinine, Ser: 1.03 mg/dL (ref 0.61–1.24)
GFR calc Af Amer: >60 mL/min (ref >60)
GFR calc non Af Amer: >60 mL/min (ref >60)
Glucose, Bld: 109 mg/dL — ABNORMAL HIGH (ref 70–99)
Potassium: 4 mmol/L (ref 3.5–5.1)
Sodium: 140 mmol/L (ref 135–145)

## 2019-01-18 MED ORDER — HYDROCODONE-ACETAMINOPHEN 5-325 MG PO TABS
1.0000 | ORAL_TABLET | Freq: Four times a day (QID) | ORAL | Status: DC | PRN
  Administered 2019-01-18: 2 via ORAL
  Filled 2019-01-18: qty 2

## 2019-01-18 MED ORDER — AMOXICILLIN-POT CLAVULANATE 875-125 MG PO TABS: 1.0000 | ORAL_TABLET | Freq: Two times a day (BID) | ORAL | 0 refills | Status: AC

## 2019-01-18 MED ORDER — PANTOPRAZOLE SODIUM 40 MG PO TBEC: 40.0000 mg | DELAYED_RELEASE_TABLET | Freq: Every day | ORAL | 0 refills | Status: DC

## 2019-01-18 MED ORDER — HYDROCODONE-ACETAMINOPHEN 5-325 MG PO TABS: 1.0000 | ORAL_TABLET | Freq: Four times a day (QID) | ORAL | 0 refills | Status: DC | PRN

## 2019-01-18 NOTE — Discharge Summary (Signed)
Physician Discharge Summary  DEPAUL JACKOVICH [id number] DOB: 1940/05/06 DOA: 01/15/2019  PCP: Milana Na., MD  Admit date: 01/15/2019 Discharge date: 01/18/2019  Time spent: 45 minutes  Recommendations for Outpatient Follow-up:  Patient will be discharged to home.  Patient will need to follow up with primary care provider within one week of discharge. Follow up with Dr. Ninfa Linden, surgery, and interventional radiology. Patient should continue medications as prescribed.  Patient should follow a heart healthy diet.   Discharge Diagnoses:  Acute cholecystitis New onset atrial fibrillation Essential hypertension Hypokalemia Acute kidney injury Prostate cancer Mild intermittent asthma  Discharge Condition: Stable  Diet recommendation: heart healthy  Filed Weights   01/15/19 1705  Weight: 83.1 kg    History of present illness:  on 01/15/2019 by Dr. Sydnee Cabal a 78 y.o.malewith medical history significant ofprostate cancer-currently on radiation therapy, GERD, well-controlled asthma, new onset A. Fib-on Eliquis presents to emergency department due to worsening right upper quadrant pain since 6 days. Reports pain is 10 out of 10, sometimes radiates to his back, aggravates with movements and eating food, relieveswith nothing, denies association with nausea, vomiting, melena, hematemesis, fever, chills, pruritus, change in skin or urine color.Had bowel movement 6 days ago. His appetite is somewhat decreased due to pain however denies weight loss, night sweats, over-the-counter use of NSAIDs.  Patient evaluated in ED last week with similar symptoms and CT abdomenshowed: Colonic diverticulosis, small gallstones, andmild intrahepatic biliary ductal dilatationand was discharged home on Cipro and Flagyl.   On 01/13/2019 patient was very weak and having heart palpitation-family called 911 and patient was found to be in A. fib with RVR with  hypotension.Patient was cardioverted in route to the emergency department. Patient went back to sinus rhythm and was feeling much better upon arrival to the ER. Patient started on Eliquis and discharged home in stable condition. Heevaluated by cardiology yesterday. He took Eliquis last night. He has been taking hydrocodone for right upper quadrant pain/epigastric pain with no help at all. He has been taking his Cipro and Flagyl as prescribed and finish the course yesterday. He was seen by Dr. Ninfa Linden this morning and due to constant abdominal pain he was advised to go to the ER for further evaluation and management.  He lives with his wife and denies smoking, alcohol, illicit drug use. He is getting radiation therapy 5 days in a week from M-F.His last radiation therapy was on last Friday.  Hospital Course:  Acute cholecystitis -RUQ ultrasound showed findings suggestive of acute cholecystitis -Currently afebrile with no leukocytosis -LFTs and lipase within normal limits -Blood culture showed no growth to date -was placed on IVF and zosyn -General surgery consulted and appreciated -Cardiology also consulted for preoperative assessment, recommended patient wait approximately 80 month if possible.  However if surgery is needed immediately, would bridge with heparin. -HIDA: Nonvisualization of gallbladder including after IV morphine.  Findings suggest cystic duct obstruction. -Interventional radiology consulted and appreciated- s/p percutaneous cholecystostomy drain  -Will discharge patient with Augmentin -patient to follow up with Dr. Ninfa Linden ([facility]) and IR  New onset atrial fibrillation -Status post cardioversion on 01/13/2019 -As above Eliquis held -Patient currently in sinus rhythm -Cardiology consulted and appreciated- recommended restarting Eliquis 2.5mg  BID (80/renally adjusted) when possible   Essential hypertension -Continue Amlodipine and HCTZ  Hypokalemia -resolved,  continue to monitor BMP  Acute kidney injury -Creatinine improving, currently down to 1.03 (1.46 on admission), baseline approximate 1.1  Prostate cancer -Last radiation therapy was  on 01/10/2019  Mild intermittent asthma -Currently stable, no wheezing on exam -Continue albuterol as needed  Consultants General surgery Cardiology Interventional radiology  Procedures  HIDA Perc cholecystostomy tube placed  Discharge Exam: Vitals:   01/18/19 0553 01/18/19 0900  BP: (!) 151/74 135/79  Pulse: 67 65  Resp: 17 18  Temp: 98.5 F (36.9 C) (!) 97.4 F (36.3 C)  SpO2: 93% 99%     General: Well developed, well nourished, NAD, appears stated age  HEENT: NCAT, mucous membranes moist.  Cardiovascular: S1 S2 auscultated, RRR, no murmur  Respiratory: Clear to auscultation bilaterally   Abdomen: Soft, nontender, nondistended, + bowel sounds, +drain  Extremities: warm dry without cyanosis clubbing or edema  Neuro: AAOx3, nonfocal  Psych: Pleasant, appropriate mood and affect  Discharge Instructions Discharge Instructions    Diet - low sodium heart healthy   Complete by: As directed    Discharge instructions   Complete by: As directed    Patient will be discharged to home.  Patient will need to follow up with primary care provider within one week of discharge. Follow up with Dr. Ninfa Linden, surgery, and interventional radiology. Patient should continue medications as prescribed.  Patient should follow a heart healthy diet.     Allergies as of 01/18/2019   No Known Allergies     Medication List    STOP taking these medications   ciprofloxacin 500 MG tablet Commonly known as: CIPRO   metroNIDAZOLE 500 MG tablet Commonly known as: FLAGYL   omeprazole 20 MG capsule Commonly known as: PRILOSEC     TAKE these medications   albuterol 108 (90 Base) MCG/ACT inhaler Commonly known as: VENTOLIN HFA Inhale 2 puffs into the lungs every 6 (six) hours as needed for  wheezing or shortness of breath.   amLODipine 5 MG tablet Commonly known as: NORVASC Take 5 mg by mouth daily.   amoxicillin-clavulanate 875-125 MG tablet Commonly known as: Augmentin Take 1 tablet by mouth 2 (two) times daily for 7 days.   apixaban 2.5 MG Tabs tablet Commonly known as: ELIQUIS Take 1 tablet (2.5 mg total) by mouth 2 (two) times daily.   hydrochlorothiazide 25 MG tablet Commonly known as: HYDRODIURIL Take 1 tablet (25 mg total) by mouth daily.   HYDROcodone-acetaminophen 5-325 MG tablet Commonly known as: NORCO/VICODIN Take 1-2 tablets by mouth every 6 (six) hours as needed for moderate pain. What changed: how much to take   ondansetron 4 MG disintegrating tablet Commonly known as: Zofran ODT Take 1 tablet (4 mg total) by mouth every 8 (eight) hours as needed.   pantoprazole 40 MG tablet Commonly known as: PROTONIX Take 1 tablet (40 mg total) by mouth daily. Start taking on: January 19, 2019   SLEEP AID PO Take 2 tablets by mouth at bedtime.   tamsulosin 0.4 MG Caps capsule Commonly known as: FLOMAX Take 0.4 mg by mouth.   Travatan Z 0.004 % Soln ophthalmic solution Generic drug: Travoprost (BAK Free) Place 1 drop into both eyes 3 (three) times daily.      No Known Allergies Follow-up Information    Coralie Keens, MD Follow up in 5 week(s).   Specialty: General Surgery Why: our office will call you with appointment date and time Contact information: [address] [place] [address] [id number]        Markus Daft, MD Follow up in 4 week(s).   Specialties: Interventional Radiology, Radiology Contact information: [place] [address]  [phone]        Puschinsky, Fransico Him., MD. Schedule an appointment as soon as possible for a visit in 1 week(s).   Specialty: General Surgery Why: Hospital follow up Contact information: [place] [address] [place]  [address] [phone]            The results of significant diagnostics from this hospitalization (including imaging, microbiology, ancillary and laboratory) are listed below for reference.    Significant Diagnostic Studies: Ct Abdomen Wo Contrast  Result Date: 01/17/2019 CLINICAL DATA:  78 year old with a cholecystitis. Recent percutaneous cholecystostomy tube placement. Evidence for gas within the gallbladder during cholecystostomy tube placement and CT was performed to exclude any bowel involvement or free intraperitoneal air. EXAM: CT ABDOMEN WITHOUT CONTRAST TECHNIQUE: Multidetector CT imaging of the abdomen was performed following the standard protocol without IV contrast. COMPARISON:  CT abdomen 01/10/2019 FINDINGS: Lower chest: Atelectasis in the lower lobes, right side greater than left. Trace left pleural fluid. Hepatobiliary: Percutaneous cholecystostomy tube has been placed in the interim. There is a small amount of contrast in the gallbladder from recent drain injection. The tube extends directly into the gallbladder and there is no evidence bowel involvement. There is small amount of subcutaneous gas near the drain placement and probably related to the gas that was previously in the gallbladder. The gallbladder is decompressed without intraluminal gas. Mild inflammatory changes around the gallbladder and right upper abdomen. Limited evaluation of the liver on this non-contrast study but cannot exclude periportal edema. No significant perihepatic fluid. Pancreas: Unremarkable. No pancreatic ductal dilatation or surrounding inflammatory changes. Duodenal diverticulum near the pancreatic head. Spleen: Normal in size without focal abnormality. Adrenals/Urinary Tract: Normal adrenal glands. 4 mm stone in the right kidney upper pole. There may be a slightly hyperdense cyst in the right kidney upper pole that is better characterized on the prior contrast enhanced CT. Negative for hydronephrosis.  Stomach/Bowel: Diverticula involving the left colon. Evidence for diverticula involving the duodenum. Normal appearance of the stomach. Inflammatory changes around the duodenum and adjacent to the gallbladder. No evidence for bowel obstruction. 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 Atherosclerotic calcifications in the abdominal aorta. No enlarged lymph nodes in the abdomen. Other: Increased inflammatory changes in the right upper quadrant near the gallbladder and duodenum compared to the prior CT. Small amount of gas in the subcutaneous tissues near the catheter placement. Small amount of extraperitoneal gas adjacent to the right upper ribs. Musculoskeletal: No acute bone abnormality. IMPRESSION: 1. Cholecystostomy tube is appropriately positioned within the gallbladder. There is no evidence for bowel involvement or injury. 2. Increased inflammatory changes in the right upper quadrant compared to the prior CT and compatible with acute cholecystitis. 3. Subcutaneous gas near the drain compatible with recent placement and gas that was within the gallbladder. 4. Bibasilar atelectasis.  Trace left pleural fluid. 5. Nonobstructive right kidney stone. 6.  Aortic Atherosclerosis (ICD10-I70.0). Electronically Signed   By: Markus Daft M.D.   On: 01/17/2019 12:42   Dg Chest 2 View  Result Date: 01/10/2019 CLINICAL DATA:  Chest pain EXAM: CHEST - 2 VIEW COMPARISON:  01/28/2018 FINDINGS: The heart size and mediastinal contours are within normal limits. Both lungs are clear. The visualized skeletal structures are unremarkable. IMPRESSION: No active cardiopulmonary disease. Electronically Signed   By: Donavan Foil M.D.   On: 01/10/2019 18:48   Nm Hepatobiliary Liver Func  Result Date: 01/16/2019 CLINICAL DATA:  Right upper quadrant pain  rule out cholecystitis EXAM: NUCLEAR MEDICINE HEPATOBILIARY IMAGING TECHNIQUE: Sequential images of the abdomen were obtained out to 60  minutes following intravenous administration of radiopharmaceutical. RADIOPHARMACEUTICALS:  5.1 mCi Tc-51m  Choletec IV COMPARISON:  Ultrasound abdomen 01/15/2019 FINDINGS: Prompt uptake by the liver. Early visualization of the duodenum and small bowel. No filling of the gallbladder. The patient received 3 mg of morphine IV. Subsequent imaging reveals no gallbladder visualization. IMPRESSION: Nonvisualization of gallbladder including after IV morphine. Findings suggest cystic duct obstruction. Electronically Signed   By: Franchot Gallo M.D.   On: 01/16/2019 18:24   Ct Abdomen Pelvis W Contrast  Result Date: 01/10/2019 CLINICAL DATA:  78 year old male with abdominal pain. Concern for acute diverticulitis. EXAM: CT ABDOMEN AND PELVIS WITH CONTRAST TECHNIQUE: Multidetector CT imaging of the abdomen and pelvis was performed using the standard protocol following bolus administration of intravenous contrast. CONTRAST:  12mL OMNIPAQUE IOHEXOL 300 MG/ML  SOLN COMPARISON:  CT of the abdomen pelvis dated 10/31/2018 FINDINGS: Lower chest: There are bibasilar dependent atelectatic changes. The visualized lung bases are otherwise clear. Atherosclerotic calcification of the aortic valve. No intra-abdominal free air or free fluid. Hepatobiliary: Slight irregularity of the liver contour. There is mild intrahepatic biliary ductal dilatation versus less likely mild periportal edema. There are small stones or sludge ball within the gallbladder. No pericholecystic fluid or evidence of acute cholecystitis by CT. Pancreas: Unremarkable. No pancreatic ductal dilatation or surrounding inflammatory changes. Spleen: Normal in size without focal abnormality. Adrenals/Urinary Tract: The adrenal glands are unremarkable. There is a 4 mm nonobstructing right renal upper pole calculus. A 2.1 cm right renal upper pole hypodense lesion is not well characterized on this CT but appears similar to prior CT. Further evaluation with MRI as  previously recommended. There is no hydronephrosis on either side. There is symmetric enhancement and excretion of contrast by both kidneys. The visualized ureters and urinary bladder appear unremarkable. Stomach/Bowel: There is a 3 cm duodenal diverticulum. No associated inflammatory changes. There is sigmoid diverticulosis as well as scattered colonic diverticula. Mild perisigmoid stranding may be chronic and scarring or represent mild acute diverticulitis. Clinical correlation is recommended. No diverticular abscess or perforation. There is no bowel obstruction. Appendectomy. Vascular/Lymphatic: Advanced atherosclerotic calcification of the aorta. 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 This is similar to prior CT. The IVC is unremarkable. No portal venous gas. There is no adenopathy. Reproductive: Mildly enlarged prostate gland with median lobe hypertrophy measuring approximately 5 cm in transverse axial diameter. Prostate fiducial markers noted. Other: Small fat containing umbilical hernia.0 Musculoskeletal: Mild degenerative changes of the spine. No acute osseous pathology. IMPRESSION: 1. Colonic diverticulosis. Mild perisigmoid stranding may be chronic or represent mild acute diverticulitis. Clinical correlation is recommended. No diverticular abscess or perforation. 2. Small gallstones.  No evidence of acute cholecystitis by CT. 3. 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 Follow-up as previously recommended. 4. Mildly enlarged prostate gland with median lobe hypertrophy. Aortic Atherosclerosis (ICD10-I70.0). Electronically Signed   By: Anner Crete M.D.   On: 01/10/2019 21:32   Ir Perc Cholecystostomy  Result Date: 01/17/2019 INDICATION: 78 year old with acute cholecystitis. Patient is not a surgical candidate at this time. EXAM: IMAGE GUIDED PERCUTANEOUS CHOLECYSTOSTOMY TUBE PLACEMENT MEDICATIONS: Inpatient and receiving IV antibiotics ANESTHESIA/SEDATION:  Moderate (conscious) sedation was employed during this procedure. A total of Versed 1.0 mg and Fentanyl 100 mcg was administered intravenously. Moderate Sedation Time: 21 minutes. The patient's level of consciousness and vital signs were  monitored continuously by radiology nursing throughout the procedure under my direct supervision. FLUOROSCOPY TIME:  Fluoroscopy Time: 1 minutes, 30 seconds, 34 mGy COMPLICATIONS: None immediate. CONTRAST:  7 mL Omnipaque 300 PROCEDURE: Informed written consent was obtained from the patient after a thorough discussion of the procedural risks, benefits and alternatives. All questions were addressed. Maximal Sterile Barrier Technique was utilized including caps, mask, sterile gowns, sterile gloves, sterile drape, hand hygiene and skin antiseptic. A timeout was performed prior to the initiation of the procedure. Ultrasound was used to identify the gallbladder. There appeared to be a large amount of gas within the gallbladder and this is confirmed with fluoroscopy. The right abdomen was prepped and draped in sterile fashion. Skin was anesthetized with 1% lidocaine. Using ultrasound guidance, 21 gauge needle was directed directly into the gallbladder. 0.018 wire was placed. An Accustick dilator set was placed. Tract was dilated over a J wire. A 10.2 [place] multipurpose drain was advanced over the wire and placed in the gallbladder. Combination of gas and dark bile was aspirated from the gallbladder. The gallbladder is decompressed based on ultrasound after drain placement. Sample of bile was sent for culture. Catheter was sutured to the skin and attached to a gravity bag. Fluoroscopic and ultrasound images were taken and saved for documentation. FINDINGS: Shadowing within the gallbladder suggestive for gas. Gas was confirmed with fluoroscopy. 10.2 French drain was placed in the gallbladder and the gallbladder was decompressed after drain placement. Following drain placement, the  gallbladder was obscured on ultrasound by gas. Not clear if the patient had subcutaneous or free intraperitoneal air. Bile and gas was aspirated from the drain. Drain injection confirmed placement in the gallbladder. IMPRESSION: Successful percutaneous cholecystostomy tube placement with ultrasound and fluoroscopic guidance. Due to the gas within the gallbladder, follow-up CT was performed following drain placement to exclude bowel involvement. Electronically Signed   By: Markus Daft M.D.   On: 01/17/2019 11:48   Dg Chest Port 1 View  Result Date: 01/13/2019 CLINICAL DATA:  Weakness EXAM: PORTABLE CHEST 1 VIEW COMPARISON:  01/10/2019 FINDINGS: Cardiac shadow is within normal limits. Aortic calcifications are seen. Mild right basilar atelectasis is noted. No focal confluent infiltrate or effusion is seen. No bony abnormality is noted. IMPRESSION: Right basilar atelectasis. Electronically Signed   By: Inez Catalina M.D.   On: 01/13/2019 02:08   US Abdomen Limited Ruq  Result Date: 01/15/2019 CLINICAL DATA:  Right upper quadrant pain EXAM: ULTRASOUND ABDOMEN LIMITED RIGHT UPPER QUADRANT COMPARISON:  None. FINDINGS: Gallbladder: The gallbladder is partially decompressed with apparent wall thickening measuring up to 10 mm. There is a positive sonographic Murphy sign and small amount of pericholecystic fluid. Layering hyperechoic sludge is seen. There is also a echogenic shadowing calculi measuring 1.4 cm. Common bile duct: Diameter: 4.3 mm Liver: No focal lesion identified. Within normal limits in parenchymal echogenicity. Portal vein is patent on color Doppler imaging with normal direction of blood flow towards the liver. Other: None. IMPRESSION: Findings which could be suggestive of acute cholecystitis. Electronically Signed   By: Prudencio Pair M.D.   On: 01/15/2019 13:00    Microbiology: Recent Results (from the past 240 hour(s))  SARS Coronavirus 2 by RT PCR (hospital order, performed in [facility]  hospital lab) Nasopharyngeal Nasopharyngeal Swab     Status: None   Collection Time: 01/15/19 11:18 AM   Specimen: Nasopharyngeal Swab  Result Value Ref Range Status   SARS Coronavirus 2 NEGATIVE NEGATIVE Final    Comment: (NOTE)  If result is NEGATIVE SARS-CoV-2 target nucleic acids are NOT DETECTED. The SARS-CoV-2 RNA is generally detectable in upper and lower  respiratory specimens during the acute phase of infection. The lowest  concentration of SARS-CoV-2 viral copies this assay can detect is 250  copies / mL. A negative result does not preclude SARS-CoV-2 infection  and should not be used as the sole basis for treatment or other  patient management decisions.  A negative result may occur with  improper specimen collection / handling, submission of specimen other  than nasopharyngeal swab, presence of viral mutation(s) within the  areas targeted by this assay, and inadequate number of viral copies  (<250 copies / mL). A negative result must be combined with clinical  observations, patient history, and epidemiological information. If result is POSITIVE SARS-CoV-2 target nucleic acids are DETECTED. The SARS-CoV-2 RNA is generally detectable in upper and lower  respiratory specimens dur ing the acute phase of infection.  Positive  results are indicative of active infection with SARS-CoV-2.  Clinical  correlation with patient history and other diagnostic information is  necessary to determine patient infection status.  Positive results do  not rule out bacterial infection or co-infection with other viruses. If result is PRESUMPTIVE POSTIVE SARS-CoV-2 nucleic acids MAY BE PRESENT.   A presumptive positive result was obtained on the submitted specimen  and confirmed on repeat testing.  While 2019 novel coronavirus  (SARS-CoV-2) nucleic acids may be present in the submitted sample  additional confirmatory testing may be necessary for epidemiological  and / or clinical management  purposes  to differentiate between  SARS-CoV-2 and other Sarbecovirus currently known to infect humans.  If clinically indicated additional testing with an alternate test  methodology [phone]) is advised. The SARS-CoV-2 RNA is generally  detectable in upper and lower respiratory sp ecimens during the acute  phase of infection. The expected result is Negative. Fact Sheet for Patients:  StrictlyIdeas.no Fact Sheet for Healthcare Providers: BankingDealers.co.za This test is not yet approved or cleared by the [place] FDA and has been authorized for detection and/or diagnosis of SARS-CoV-2 by FDA under an Emergency Use Authorization (EUA).  This EUA will remain in effect (meaning this test can be used) for the duration of the COVID-19 declaration under Section 564(b)(1) of the Act, 21 U.S.C. section 360bbb-3(b)(1), unless the authorization is terminated or revoked sooner. Performed at [facility], [place] [address], [place], [place] [address]   Culture, blood (routine x 2)     Status: None (Preliminary result)   Collection Time: 01/15/19  5:08 PM   Specimen: BLOOD  Result Value Ref Range Status   Specimen Description BLOOD RIGHT ANTECUBITAL  Final   Special Requests   Final    BOTTLES DRAWN AEROBIC AND ANAEROBIC Blood Culture results may not be optimal due to an inadequate volume of blood received in culture bottles   Culture   Final    NO GROWTH 3 DAYS Performed at [facility], [place] [address], [place], [place] [address]    Report Status PENDING  Incomplete  Culture, blood (routine x 2)     Status: None (Preliminary result)   Collection Time: 01/15/19  5:13 PM   Specimen: BLOOD LEFT HAND  Result Value Ref Range Status   Specimen Description BLOOD LEFT HAND  Final   Special Requests   Final    BOTTLES DRAWN AEROBIC AND ANAEROBIC Blood Culture results may not be optimal due to an inadequate volume of blood  received in  culture bottles   Culture   Final    NO GROWTH 3 DAYS Performed at [facility], [place] [address], [place], [place] [address]    Report Status PENDING  Incomplete  Aerobic/Anaerobic Culture (surgical/deep wound)     Status: None (Preliminary result)   Collection Time: 01/17/19 10:25 AM   Specimen: BILE  Result Value Ref Range Status   Specimen Description BILE  Final   Special Requests NONE  Final   Gram Stain   Final    NO WBC SEEN ABUNDANT GRAM VARIABLE ROD Performed at [facility], [place] [address] [place], [place] [address]    Culture PENDING  Incomplete   Report Status PENDING  Incomplete     Labs: Basic Metabolic Panel: Recent Labs  Lab 01/13/19 0220 01/15/19 1101 01/15/19 1713 01/16/19 0533 01/17/19 0548 01/18/19 0440  NA 129* 134*  --  136 136 140  K 4.4 3.4*  --  3.8 4.0 4.0  CL 93* 101  --  106 103 106  CO2 23 25  --  23 23 22   GLUCOSE 126* 127*  --  116* 118* 109*  BUN 22 19  --  13 8 6*  CREATININE 1.70* 1.46*  --  1.28* 1.10 1.03  CALCIUM 8.6* 8.7*  --  8.3* 8.4* 8.5*  MG 2.0  --  2.1  --   --   --   PHOS  --   --  3.6  --   --   --    Liver Function Tests: Recent Labs  Lab 01/13/19 0220 01/15/19 1101 01/16/19 0533  AST 33 15 17  ALT 23 14 14   ALKPHOS 42 58 78  BILITOT 2.0* 0.8 0.9  PROT 6.4* 6.3* 5.8*  ALBUMIN 3.3* 2.9* 2.6*   Recent Labs  Lab 01/13/19 0220 01/15/19 1713  LIPASE 20 28   No results for input(s): AMMONIA in the last 168 hours. CBC: Recent Labs  Lab 01/13/19 0220 01/15/19 1101 01/16/19 0533 01/17/19 0548 01/18/19 0440  WBC 14.2* 10.1 9.7 8.1 5.1  NEUTROABS 12.4* 7.8*  --   --   --   HGB 13.1 11.9* 10.9* 11.1* 10.6*  HCT 37.0* 33.7* 31.1* 32.0* 30.8*  MCV 88.3 88.5 88.4 87.9 89.5  PLT 150 207 212 239 277   Cardiac Enzymes: No results for input(s): CKTOTAL, CKMB, CKMBINDEX, TROPONINI in the last 168 hours. BNP: BNP (last 3 results) No results for input(s): BNP in the last 8760 hours.  ProBNP (last 3  results) No results for input(s): PROBNP in the last 8760 hours.  CBG: Recent Labs  Lab 01/13/19 0137  GLUCAP 116*       Signed:  Eddi Hymes  [facility] 01/18/2019, 9:06 AM

## 2019-01-18 NOTE — Progress Notes (Addendum)
Subjective: Patient feels well today.  Previous pain has resolved.  Only have pain around his drain as expected.  Pain medication helps this.  Tolerating clear liquids.    ROS: See above, otherwise other systems negative  Objective: Vital signs in last 24 hours: Temp:  [98.1 F (36.7 C)-98.5 F (36.9 C)] 98.5 F (36.9 C) (10/31 0553) Pulse Rate:  [60-84] 67 (10/31 0553) Resp:  [14-21] 17 (10/31 0553) BP: (126-159)/(59-78) 151/74 (10/31 0553) SpO2:  [93 %-99 %] 93 % (10/31 0553) Last BM Date: 01/17/19  Intake/Output from previous day: 10/30 0701 - 10/31 0700 In: 3868.4 [P.O.:480; I.V.:3383.4] Out: 1258 [Urine:1240; Drains:18] Intake/Output this shift: No intake/output data recorded.  PE: Abd: soft, NT except around drain as expected, drain just emptied so no output currently, +BS, ND  Lab Results:  Recent Labs    01/17/19 0548 01/18/19 0440  WBC 8.1 5.1  HGB 11.1* 10.6*  HCT 32.0* 30.8*  PLT 239 277   BMET Recent Labs    01/17/19 0548 01/18/19 0440  NA 136 140  K 4.0 4.0  CL 103 106  CO2 23 22  GLUCOSE 118* 109*  BUN 8 6*  CREATININE 1.10 1.03  CALCIUM 8.4* 8.5*   PT/INR Recent Labs    01/16/19 0533  LABPROT 16.4*  INR 1.3*   CMP     Component Value Date/Time   NA 140 01/18/2019 0440   K 4.0 01/18/2019 0440   CL 106 01/18/2019 0440   CO2 22 01/18/2019 0440   GLUCOSE 109 (H) 01/18/2019 0440   BUN 6 (L) 01/18/2019 0440   CREATININE 1.03 01/18/2019 0440   CALCIUM 8.5 (L) 01/18/2019 0440   PROT 5.8 (L) 01/16/2019 0533   ALBUMIN 2.6 (L) 01/16/2019 0533   AST 17 01/16/2019 0533   ALT 14 01/16/2019 0533   ALKPHOS 78 01/16/2019 0533   BILITOT 0.9 01/16/2019 0533   GFRNONAA >60 01/18/2019 0440   GFRAA >60 01/18/2019 0440   Lipase     Component Value Date/Time   LIPASE 28 01/15/2019 1713       Studies/Results: Ct Abdomen Wo Contrast  Result Date: 01/17/2019 CLINICAL DATA:  78 year old with a cholecystitis. Recent percutaneous  cholecystostomy tube placement. Evidence for gas within the gallbladder during cholecystostomy tube placement and CT was performed to exclude any bowel involvement or free intraperitoneal air. EXAM: CT ABDOMEN WITHOUT CONTRAST TECHNIQUE: Multidetector CT imaging of the abdomen was performed following the standard protocol without IV contrast. COMPARISON:  CT abdomen 01/10/2019 FINDINGS: Lower chest: Atelectasis in the lower lobes, right side greater than left. Trace left pleural fluid. Hepatobiliary: Percutaneous cholecystostomy tube has been placed in the interim. There is a small amount of contrast in the gallbladder from recent drain injection. The tube extends directly into the gallbladder and there is no evidence bowel involvement. There is small amount of subcutaneous gas near the drain placement and probably related to the gas that was previously in the gallbladder. The gallbladder is decompressed without intraluminal gas. Mild inflammatory changes around the gallbladder and right upper abdomen. Limited evaluation of the liver on this non-contrast study but cannot exclude periportal edema. No significant perihepatic fluid. Pancreas: Unremarkable. No pancreatic ductal dilatation or surrounding inflammatory changes. Duodenal diverticulum near the pancreatic head. Spleen: Normal in size without focal abnormality. Adrenals/Urinary Tract: Normal adrenal glands. 4 mm stone in the right kidney upper pole. There may be a slightly hyperdense cyst in the right kidney upper pole that is better  characterized on the prior contrast enhanced CT. Negative for hydronephrosis. Stomach/Bowel: Diverticula involving the left colon. Evidence for diverticula involving the duodenum. Normal appearance of the stomach. Inflammatory changes around the duodenum and adjacent to the gallbladder. No evidence for bowel obstruction. 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 Atherosclerotic  calcifications in the abdominal aorta. No enlarged lymph nodes in the abdomen. Other: Increased inflammatory changes in the right upper quadrant near the gallbladder and duodenum compared to the prior CT. Small amount of gas in the subcutaneous tissues near the catheter placement. Small amount of extraperitoneal gas adjacent to the right upper ribs. Musculoskeletal: No acute bone abnormality. IMPRESSION: 1. Cholecystostomy tube is appropriately positioned within the gallbladder. There is no evidence for bowel involvement or injury. 2. Increased inflammatory changes in the right upper quadrant compared to the prior CT and compatible with acute cholecystitis. 3. Subcutaneous gas near the drain compatible with recent placement and gas that was within the gallbladder. 4. Bibasilar atelectasis.  Trace left pleural fluid. 5. Nonobstructive right kidney stone. 6.  Aortic Atherosclerosis (ICD10-I70.0). Electronically Signed   By: Markus Daft M.D.   On: 01/17/2019 12:42   Nm Hepatobiliary Liver Func  Result Date: 01/16/2019 CLINICAL DATA:  Right upper quadrant pain rule out cholecystitis EXAM: NUCLEAR MEDICINE HEPATOBILIARY IMAGING TECHNIQUE: Sequential images of the abdomen were obtained out to 60 minutes following intravenous administration of radiopharmaceutical. RADIOPHARMACEUTICALS:  5.1 mCi Tc-42m  Choletec IV COMPARISON:  Ultrasound abdomen 01/15/2019 FINDINGS: Prompt uptake by the liver. Early visualization of the duodenum and small bowel. No filling of the gallbladder. The patient received 3 mg of morphine IV. Subsequent imaging reveals no gallbladder visualization. IMPRESSION: Nonvisualization of gallbladder including after IV morphine. Findings suggest cystic duct obstruction. Electronically Signed   By: Franchot Gallo M.D.   On: 01/16/2019 18:24   Ir Perc Cholecystostomy  Result Date: 01/17/2019 INDICATION: 78 year old with acute cholecystitis. Patient is not a surgical candidate at this time. EXAM:  IMAGE GUIDED PERCUTANEOUS CHOLECYSTOSTOMY TUBE PLACEMENT MEDICATIONS: Inpatient and receiving IV antibiotics ANESTHESIA/SEDATION: Moderate (conscious) sedation was employed during this procedure. A total of Versed 1.0 mg and Fentanyl 100 mcg was administered intravenously. Moderate Sedation Time: 21 minutes. The patient's level of consciousness and vital signs were monitored continuously by radiology nursing throughout the procedure under my direct supervision. FLUOROSCOPY TIME:  Fluoroscopy Time: 1 minutes, 30 seconds, 34 mGy COMPLICATIONS: None immediate. CONTRAST:  7 mL Omnipaque 300 PROCEDURE: Informed written consent was obtained from the patient after a thorough discussion of the procedural risks, benefits and alternatives. All questions were addressed. Maximal Sterile Barrier Technique was utilized including caps, mask, sterile gowns, sterile gloves, sterile drape, hand hygiene and skin antiseptic. A timeout was performed prior to the initiation of the procedure. Ultrasound was used to identify the gallbladder. There appeared to be a large amount of gas within the gallbladder and this is confirmed with fluoroscopy. The right abdomen was prepped and draped in sterile fashion. Skin was anesthetized with 1% lidocaine. Using ultrasound guidance, 21 gauge needle was directed directly into the gallbladder. 0.018 wire was placed. An Accustick dilator set was placed. Tract was dilated over a J wire. A 10.2 [place] multipurpose drain was advanced over the wire and placed in the gallbladder. Combination of gas and dark bile was aspirated from the gallbladder. The gallbladder is decompressed based on ultrasound after drain placement. Sample of bile was sent for culture. Catheter was sutured to the skin and attached to a  gravity bag. Fluoroscopic and ultrasound images were taken and saved for documentation. FINDINGS: Shadowing within the gallbladder suggestive for gas. Gas was confirmed with fluoroscopy. 10.2 French drain  was placed in the gallbladder and the gallbladder was decompressed after drain placement. Following drain placement, the gallbladder was obscured on ultrasound by gas. Not clear if the patient had subcutaneous or free intraperitoneal air. Bile and gas was aspirated from the drain. Drain injection confirmed placement in the gallbladder. IMPRESSION: Successful percutaneous cholecystostomy tube placement with ultrasound and fluoroscopic guidance. Due to the gas within the gallbladder, follow-up CT was performed following drain placement to exclude bowel involvement. Electronically Signed   By: Markus Daft M.D.   On: 01/17/2019 11:48    Anti-infectives: Anti-infectives (From admission, onward)   Start     Dose/Rate Route Frequency Ordered Stop   01/17/19 0900  piperacillin-tazobactam (ZOSYN) IVPB 3.375 g     3.375 g 100 mL/hr over 30 Minutes Intravenous  Once 01/17/19 0853 01/17/19 2244   01/15/19 2030  piperacillin-tazobactam (ZOSYN) IVPB 3.375 g     3.375 g 100 mL/hr over 30 Minutes Intravenous  Once 01/15/19 1310 01/15/19 2111   01/15/19 1315  piperacillin-tazobactam (ZOSYN) IVPB 3.375 g  Status:  Discontinued     3.375 g 100 mL/hr over 30 Minutes Intravenous  Once 01/15/19 1307 01/15/19 1310   01/15/19 1230  piperacillin-tazobactam (ZOSYN) IVPB 3.375 g     3.375 g 100 mL/hr over 30 Minutes Intravenous  Once 01/15/19 1219 01/15/19 1322   01/15/19 1215  cefTRIAXone (ROCEPHIN) 1 g in sodium chloride 0.9 % 100 mL IVPB  Status:  Discontinued     1 g 200 mL/hr over 30 Minutes Intravenous  Once 01/15/19 1202 01/15/19 1218       Assessment/Plan A fib with cardioversion, on eliquis   acute cholecystitis, s/p perc chole drain by IR -doing great -regular diet -drain teaching, defer to IR if they want this flushed at home or not -will need IR drain clinic follow up in 4-5 weeks and follow up with Dr. Ninfa Linden in 5-6 weeks to discuss elective lap chole once eliquis can safely be held.  Our follow  up is being arranged -total of 7-10 days of abx therapy should be adequate  -surgically stable for DC home from our standpoint.  FEN - regular diet VTE - eliquis ID - [email or web address], can go home on augmentin   LOS: 3 days    Henreitta Cea , [facility] 01/18/2019, 8:41 AM Please see Amion for pager number during day hours 7:00am-4:30pm  Agree with above.  Alphonsa Overall, MD, [facility] Office phone:  [phone]

## 2019-01-18 NOTE — TOC Transition Note (Signed)
Transition of Care [facility]) - CM/SW Discharge Note   Patient Details  Name: John Livingston MRN: [id number] Date of Birth: 10-02-1940  Transition of [facility]) CM/SW Contact:  Carles Collet, RN Phone Number: 01/18/2019, 9:45 AM   Clinical Narrative:    Spoke w patient, he is agreeable to [facility]N, Reviewed [facility] list from medicare and he would like [facility].  Lives at home w wife, will have support and a ride home. Requested RN to review care of drain prior to DC. Attempted to reach nurse, could not, and left message with unit clerk.     Final next level of care: [place] Barriers to Discharge: No Barriers Identified   Patient Goals and CMS Choice Patient states their goals for this hospitalization and ongoing recovery are:: to go home CMS Medicare.gov Compare Post Acute Care list provided to:: Patient Choice offered to / list presented to : Patient  Discharge Placement                       Discharge Plan and Services                          HH Arranged: RN [facility] Agency: [place] Date [place]: 01/18/19 Time [place]: [phone] Representative spoke with at [place]: [place] ([place]) Interventions     Readmission Risk Interventions No flowsheet data found.

## 2019-01-18 NOTE — Discharge Instructions (Signed)
Cholecystostomy, Care After °This sheet gives you information about how to care for yourself after your procedure. Your health care provider may also give you more specific instructions. If you have problems or questions, contact your health care provider. °What can I expect after the procedure? °After your procedure, it is common to have soreness near the incision site of your drainage tube (catheter). °Follow these instructions at home: °Incision care ° °· Follow instructions from your health care provider about how to take care of your incision site where the catheter was inserted. Make sure you: °? Wash your hands with soap and water before and after you change your bandage (dressing). If soap and water are not available, use hand sanitizer. °? Change your dressing as told by your health care provider. °· Check the incision site every day for signs of infection. Check for: °? Redness, swelling, or pain. °? Fluid or blood. °? Warmth. °? Pus or a bad smell. °· Do not take baths, swim, or use a hot tub until your health care provider approves. Ask your health care provider if you may take showers. You may only be allowed to take sponge baths. °General instructions °· Follow instructions from your health care provider about how to care for your catheter and collection bag at home. °· Your health care provider will show you: °? How to record the amount of drainage from the catheter. °? How to flush the catheter. °? How to care for the catheter incision site. °· Follow instructions from your health care provider about eating or drinking restrictions. °· Take over-the-counter and prescription medicines only as told by your health care provider. °· Keep all follow-up visits as told by your health care provider. This is important. °Contact a health care provider if: °· You have redness, swelling, or pain around the catheter incision site. °· You have nausea or vomiting. °Get help right away if: °· Your abdominal pain  gets worse. °· You feel dizzy or you faint while standing. °· You have fluid or blood coming from the catheter incision site. °· The area around the catheter incision site feels warm to the touch. °· You have pus or a bad smell coming from the catheter incision site. °· You have a fever. °· You have shortness of breath. °· You have a rapid heartbeat. °· Your nausea or vomiting does not go away. °· Your catheter becomes blocked. °· Your catheter comes out of your abdomen. °Summary °· After your procedure, it is common to have soreness near the incision site of your drainage tube (catheter). °· Wash your hands with soap and water before and after you change your bandage (dressing). Change your dressing as told by your health care provider. °· Check the catheter incision site every day for signs of infection. Check for redness, swelling, pain, fluid, blood, warmth, pus, or a bad smell. °· Contact your health care provider if you have nausea or vomiting, or if you have redness, swelling, or pain around your catheter incision site. °· Get help right away if your abdominal pain gets worse, you feel dizzy, you have blood or fluid coming from the catheter incision site, you have a fever, or you have shortness of breath. °This information is not intended to replace advice given to you by your health care provider. Make sure you discuss any questions you have with your health care provider. °Document Released: 11/25/2014 Document Revised: 10/01/2017 Document Reviewed: 10/01/2017 °Elsevier Patient Education © 2020 [employer] ° °

## 2019-01-18 NOTE — Progress Notes (Signed)
Referring Physician(s): Dr Deland Pretty Dr [place]  Supervising Physician: Markus Daft  Patient Status:  [facility] - In-pt  Chief Complaint:  Cholecystitis Perc chole drain placed in IR 10/30  Subjective:  Up in bed No complaints Resting Denies N/V or pain  Allergies: Patient has no known allergies.  Medications: Prior to Admission medications   Medication Sig Start Date End Date Taking? Authorizing Provider  albuterol (PROVENTIL HFA;VENTOLIN HFA) 108 (90 BASE) MCG/ACT inhaler Inhale 2 puffs into the lungs every 6 (six) hours as needed for wheezing or shortness of breath. 04/18/12  Yes Elsie Stain, MD  amLODipine (NORVASC) 5 MG tablet Take 5 mg by mouth daily.    Yes [provider]  apixaban (ELIQUIS) 2.5 MG TABS tablet Take 1 tablet (2.5 mg total) by mouth 2 (two) times daily. [id number] Yes Delora Fuel, MD  ciprofloxacin (CIPRO) 500 MG tablet Take 1 tablet (500 mg total) by mouth 2 (two) times daily. 01/10/19  Yes Fredia Sorrow, MD  Doxylamine Succinate, Sleep, (SLEEP AID PO) Take 2 tablets by mouth at bedtime.   Yes [provider]  hydrochlorothiazide (HYDRODIURIL) 25 MG tablet Take 1 tablet (25 mg total) by mouth daily. 04/30/13  Yes Palumbo, April, MD  omeprazole (PRILOSEC) 20 MG capsule Take 1 capsule (20 mg total) by mouth daily. 04/18/12  Yes Elsie Stain, MD  tamsulosin (FLOMAX) 0.4 MG CAPS capsule Take 0.4 mg by mouth.   Yes [provider]  TRAVATAN Z 0.004 % SOLN ophthalmic solution Place 1 drop into both eyes 3 (three) times daily.    Yes [provider]  amoxicillin-clavulanate (AUGMENTIN) 875-125 MG tablet Take 1 tablet by mouth 2 (two) times daily for 7 days. 01/18/19 01/25/19  Cristal Ford, DO  HYDROcodone-acetaminophen (NORCO/VICODIN) 5-325 MG tablet Take 1 tablet by mouth every 6 (six) hours as needed for moderate pain. Patient not taking: Reported on 01/15/2019 01/10/19   Fredia Sorrow, MD   HYDROcodone-acetaminophen (NORCO/VICODIN) 5-325 MG tablet Take 1-2 tablets by mouth every 6 (six) hours as needed for moderate pain. 01/18/19   Mikhail, Velta Addison, DO  ondansetron (ZOFRAN ODT) 4 MG disintegrating tablet Take 1 tablet (4 mg total) by mouth every 8 (eight) hours as needed. 01/10/19   Fredia Sorrow, MD  pantoprazole (PROTONIX) 40 MG tablet Take 1 tablet (40 mg total) by mouth daily. 01/19/19   Cristal Ford, DO     Vital Signs: BP 135/79   Pulse 65   Temp (!) 97.4 F (36.3 C) (Oral)   Resp 18   Ht 6\' 3"  (1.905 m)   Wt 183 lb 3.2 oz (83.1 kg)   SpO2 99%   BMI 22.90 kg/m   Physical Exam Vitals signs reviewed.  Abdominal:     Palpations: Abdomen is soft.     Tenderness: There is no abdominal tenderness.  Skin:    General: Skin is warm and dry.     Comments: Clean and dry NT no bleeding OP bilious   Neurological:     Mental Status: He is oriented to person, place, and time.  Psychiatric:        Behavior: Behavior normal.     Imaging: Ct Abdomen Wo Contrast  Result Date: 01/17/2019 CLINICAL DATA:  78 year old with a cholecystitis. Recent percutaneous cholecystostomy tube placement. Evidence for gas within the gallbladder during cholecystostomy tube placement and CT was performed to exclude any bowel involvement or free intraperitoneal air. EXAM: CT ABDOMEN WITHOUT CONTRAST TECHNIQUE: Multidetector CT imaging of  the abdomen was performed following the standard protocol without IV contrast. COMPARISON:  CT abdomen 01/10/2019 FINDINGS: Lower chest: Atelectasis in the lower lobes, right side greater than left. Trace left pleural fluid. Hepatobiliary: Percutaneous cholecystostomy tube has been placed in the interim. There is a small amount of contrast in the gallbladder from recent drain injection. The tube extends directly into the gallbladder and there is no evidence bowel involvement. There is small amount of subcutaneous gas near the drain placement and probably  related to the gas that was previously in the gallbladder. The gallbladder is decompressed without intraluminal gas. Mild inflammatory changes around the gallbladder and right upper abdomen. Limited evaluation of the liver on this non-contrast study but cannot exclude periportal edema. No significant perihepatic fluid. Pancreas: Unremarkable. No pancreatic ductal dilatation or surrounding inflammatory changes. Duodenal diverticulum near the pancreatic head. Spleen: Normal in size without focal abnormality. Adrenals/Urinary Tract: Normal adrenal glands. 4 mm stone in the right kidney upper pole. There may be a slightly hyperdense cyst in the right kidney upper pole that is better characterized on the prior contrast enhanced CT. Negative for hydronephrosis. Stomach/Bowel: Diverticula involving the left colon. Evidence for diverticula involving the duodenum. Normal appearance of the stomach. Inflammatory changes around the duodenum and adjacent to the gallbladder. No evidence for bowel obstruction. 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 Atherosclerotic calcifications in the abdominal aorta. No enlarged lymph nodes in the abdomen. Other: Increased inflammatory changes in the right upper quadrant near the gallbladder and duodenum compared to the prior CT. Small amount of gas in the subcutaneous tissues near the catheter placement. Small amount of extraperitoneal gas adjacent to the right upper ribs. Musculoskeletal: No acute bone abnormality. IMPRESSION: 1. Cholecystostomy tube is appropriately positioned within the gallbladder. There is no evidence for bowel involvement or injury. 2. Increased inflammatory changes in the right upper quadrant compared to the prior CT and compatible with acute cholecystitis. 3. Subcutaneous gas near the drain compatible with recent placement and gas that was within the gallbladder. 4. Bibasilar atelectasis.  Trace left pleural fluid. 5.  Nonobstructive right kidney stone. 6.  Aortic Atherosclerosis (ICD10-I70.0). Electronically Signed   By: Markus Daft M.D.   On: 01/17/2019 12:42   Nm Hepatobiliary Liver Func  Result Date: 01/16/2019 CLINICAL DATA:  Right upper quadrant pain rule out cholecystitis EXAM: NUCLEAR MEDICINE HEPATOBILIARY IMAGING TECHNIQUE: Sequential images of the abdomen were obtained out to 60 minutes following intravenous administration of radiopharmaceutical. RADIOPHARMACEUTICALS:  5.1 mCi Tc-34m  Choletec IV COMPARISON:  Ultrasound abdomen 01/15/2019 FINDINGS: Prompt uptake by the liver. Early visualization of the duodenum and small bowel. No filling of the gallbladder. The patient received 3 mg of morphine IV. Subsequent imaging reveals no gallbladder visualization. IMPRESSION: Nonvisualization of gallbladder including after IV morphine. Findings suggest cystic duct obstruction. Electronically Signed   By: Franchot Gallo M.D.   On: 01/16/2019 18:24   Ir Perc Cholecystostomy  Result Date: 01/17/2019 INDICATION: 78 year old with acute cholecystitis. Patient is not a surgical candidate at this time. EXAM: IMAGE GUIDED PERCUTANEOUS CHOLECYSTOSTOMY TUBE PLACEMENT MEDICATIONS: Inpatient and receiving IV antibiotics ANESTHESIA/SEDATION: Moderate (conscious) sedation was employed during this procedure. A total of Versed 1.0 mg and Fentanyl 100 mcg was administered intravenously. Moderate Sedation Time: 21 minutes. The patient's level of consciousness and vital signs were monitored continuously by radiology nursing throughout the procedure under my direct supervision. FLUOROSCOPY TIME:  Fluoroscopy Time: 1 minutes, 30 seconds, 34 mGy COMPLICATIONS: None immediate. CONTRAST:  7 mL Omnipaque 300 PROCEDURE: Informed written consent was obtained from the patient after a thorough discussion of the procedural risks, benefits and alternatives. All questions were addressed. Maximal Sterile Barrier Technique was utilized including caps,  mask, sterile gowns, sterile gloves, sterile drape, hand hygiene and skin antiseptic. A timeout was performed prior to the initiation of the procedure. Ultrasound was used to identify the gallbladder. There appeared to be a large amount of gas within the gallbladder and this is confirmed with fluoroscopy. The right abdomen was prepped and draped in sterile fashion. Skin was anesthetized with 1% lidocaine. Using ultrasound guidance, 21 gauge needle was directed directly into the gallbladder. 0.018 wire was placed. An Accustick dilator set was placed. Tract was dilated over a J wire. A 10.2 [place] multipurpose drain was advanced over the wire and placed in the gallbladder. Combination of gas and dark bile was aspirated from the gallbladder. The gallbladder is decompressed based on ultrasound after drain placement. Sample of bile was sent for culture. Catheter was sutured to the skin and attached to a gravity bag. Fluoroscopic and ultrasound images were taken and saved for documentation. FINDINGS: Shadowing within the gallbladder suggestive for gas. Gas was confirmed with fluoroscopy. 10.2 French drain was placed in the gallbladder and the gallbladder was decompressed after drain placement. Following drain placement, the gallbladder was obscured on ultrasound by gas. Not clear if the patient had subcutaneous or free intraperitoneal air. Bile and gas was aspirated from the drain. Drain injection confirmed placement in the gallbladder. IMPRESSION: Successful percutaneous cholecystostomy tube placement with ultrasound and fluoroscopic guidance. Due to the gas within the gallbladder, follow-up CT was performed following drain placement to exclude bowel involvement. Electronically Signed   By: Markus Daft M.D.   On: 01/17/2019 11:48   US Abdomen Limited Ruq  Result Date: 01/15/2019 CLINICAL DATA:  Right upper quadrant pain EXAM: ULTRASOUND ABDOMEN LIMITED RIGHT UPPER QUADRANT COMPARISON:  None. FINDINGS: Gallbladder:  The gallbladder is partially decompressed with apparent wall thickening measuring up to 10 mm. There is a positive sonographic Murphy sign and small amount of pericholecystic fluid. Layering hyperechoic sludge is seen. There is also a echogenic shadowing calculi measuring 1.4 cm. Common bile duct: Diameter: 4.3 mm Liver: No focal lesion identified. Within normal limits in parenchymal echogenicity. Portal vein is patent on color Doppler imaging with normal direction of blood flow towards the liver. Other: None. IMPRESSION: Findings which could be suggestive of acute cholecystitis. Electronically Signed   By: Prudencio Pair M.D.   On: 01/15/2019 13:00    Labs:  CBC: Recent Labs    01/15/19 1101 01/16/19 0533 01/17/19 0548 01/18/19 0440  WBC 10.1 9.7 8.1 5.1  HGB 11.9* 10.9* 11.1* 10.6*  HCT 33.7* 31.1* 32.0* 30.8*  PLT 207 212 239 277    COAGS: Recent Labs    01/16/19 0533  INR 1.3*    BMP: Recent Labs    01/15/19 1101 01/16/19 0533 01/17/19 0548 01/18/19 0440  NA 134* 136 136 140  K 3.4* 3.8 4.0 4.0  CL 101 106 103 106  CO2 25 23 23 22   GLUCOSE 127* 116* 118* 109*  BUN 19 13 8  6*  CALCIUM 8.7* 8.3* 8.4* 8.5*  CREATININE 1.46* 1.28* 1.10 1.03  GFRNONAA 45* 53* >60 >60  GFRAA 52* >60 >60 >60    LIVER FUNCTION TESTS: Recent Labs    01/10/19 1825 01/13/19 0220 01/15/19 1101 01/16/19 0533  BILITOT 0.7 2.0* 0.8 0.9  AST 21 33 15 17  ALT 15 23 14 14   ALKPHOS 54 42 58 78  PROT 6.4* 6.4* 6.3* 5.8*  ALBUMIN 4.1 3.3* 2.9* 2.6*    Assessment and Plan:  Perc chole drain intact OP bilious Will need follow up with Dr Deland Pretty Will need flushed daily- 10 cc daily (please give pt Rx for drain flushes) Record OP daily Orders for OP IR follow up in place- 8 weeks Pt will hear from IR scheduler for time and date  Electronically Signed: Lavonia Drafts, PA-C 01/18/2019, 10:25 AM   I spent a total of 15 Minutes at the the patient's bedside AND on the patient's  hospital floor or unit, greater than 50% of which was counseling/coordinating care for Perc chole drain

## 2019-01-20 ENCOUNTER — Other Ambulatory Visit: Payer: Self-pay | Admitting: Surgery

## 2019-01-20 DIAGNOSIS — K819 Cholecystitis, unspecified: Secondary | ICD-10-CM

## 2019-01-20 LAB — CULTURE, BLOOD (ROUTINE X 2)
Culture: NO GROWTH
Culture: NO GROWTH

## 2019-01-20 LAB — AEROBIC/ANAEROBIC CULTURE W GRAM STAIN (SURGICAL/DEEP WOUND): Gram Stain: NONE SEEN

## 2019-02-17 ENCOUNTER — Other Ambulatory Visit (HOSPITAL_COMMUNITY): Payer: Self-pay | Admitting: *Deleted

## 2019-02-17 ENCOUNTER — Other Ambulatory Visit: Payer: Self-pay | Admitting: Surgery

## 2019-02-17 MED ORDER — APIXABAN 2.5 MG PO TABS: 2.5000 mg | ORAL_TABLET | Freq: Two times a day (BID) | ORAL | 3 refills | Status: DC

## 2019-02-18 ENCOUNTER — Other Ambulatory Visit: Payer: Self-pay

## 2019-02-18 ENCOUNTER — Ambulatory Visit (INDEPENDENT_AMBULATORY_CARE_PROVIDER_SITE_OTHER): Payer: Medicare Other | Admitting: Cardiovascular Disease

## 2019-02-18 ENCOUNTER — Encounter: Payer: Self-pay | Admitting: Cardiovascular Disease

## 2019-02-18 VITALS — BP 110/56 | HR 74 | Ht 69.0 in | Wt 181.0 lb

## 2019-02-18 DIAGNOSIS — Z01810 Encounter for preprocedural cardiovascular examination: Secondary | ICD-10-CM | POA: Diagnosis not present

## 2019-02-18 DIAGNOSIS — I700 Atherosclerosis of aorta: Secondary | ICD-10-CM

## 2019-02-18 DIAGNOSIS — I10 Essential (primary) hypertension: Secondary | ICD-10-CM | POA: Diagnosis not present

## 2019-02-18 DIAGNOSIS — C61 Malignant neoplasm of prostate: Secondary | ICD-10-CM

## 2019-02-18 DIAGNOSIS — Z7901 Long term (current) use of anticoagulants: Secondary | ICD-10-CM | POA: Diagnosis not present

## 2019-02-18 DIAGNOSIS — I480 Paroxysmal atrial fibrillation: Secondary | ICD-10-CM

## 2019-02-18 NOTE — Progress Notes (Signed)
Cardiology Consultation Note:    Date:  02/18/2019   ID:  John Livingston, DOB Dec 06, 1940, MRN [id number]  PCP:  Puschinsky, Fransico Him., MD  Cardiologist:  New (John Livingston) Electrophysiologist:  None   Referring MD: John Na.,*   Chief Complaint  Patient presents with  . Atrial Fibrillation  . Pre-op Exam  John Livingston is a 78 y.o. male who is being seen today for the evaluation of atrial fibrillation and preoperative cardiovascular risk at the request of Puschinsky, Fransico Him.,*.   History of Present Illness:    John Livingston is a 78 y.o. male with a hx of excellent health for most of his life, until the last few weeks.  October 23 he was seen with epigastric, then generalized abdominal pain, that happened to occur on his first day of radiation therapy for prostate cancer.  CT scan showed gallstones but no signs of cholecystitis, also some suspicion for diverticulitis.  He was treated with antibiotics.  His electrocardiogram showed sinus bradycardia and was otherwise a normal tracing.  He had some initial improvement but then on October 26 had recurrent chest discomfort and extreme weakness.  EMS was called and he was profoundly hypotensive (83/54) and tachycardic (heart rate 165), in atrial fibrillation with rapid ventricular response.  He was emergently cardioverted by EMS to sinus rhythm which he subsequently maintained without recurrence of arrhythmia.  Despite cardioversion he remained hypotensive, but gradually improved after intravenous fluids.  ECG did not show any ischemic abnormalities, other than rate related ones.  He was started on apixaban and had follow-up in the atrial fibrillation clinic on October 27 when he was essentially asymptomatic except for mild right upper quadrant pain.  On October 28 he was seen by Dr. Ninfa Livingston and referred to the emergency room with severe abdominal pain radiating to his back and clear evidence of acute cholecystitis.  He  remained in sinus rhythm.  Because he had a recent cardioversion, he underwent cholecystostomy drain percutaneously by interventional radiology, with plan for cholecystectomy after 1 month of anticoagulation.  He is doing well since then.  Cholecystostomy tube is uncomfortable but he is not in a lot of pain.  He has not had any recurrent palpitations or episodes of weakness.  He has not had fever or chills.  He denies exertional chest pain.  His bedroom is on the second floor and he goes up and down the stairs in his home 2 steps at a time without any complaints.  He continues to work 12-13 hours days and is on staff.  He denies any dyspnea at rest or with activity and has not had syncope.  He denies intermittent claudication.  He has been taking Eliquis without any bleeding complications and has no history of serious falls or injuries.  He is on the 2.5 mg twice daily dose because of his age and abnormal kidney function (creatinine 1.7 on 01/13/2019).  However his creatinine has shown substantial improvement and most recently was 1.03 on 01/18/2019.  His abdominal CT is described "advanced atherosclerotic calcification of the aorta".  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  Radiation therapy for prostate cancer has been on hold due to these other events.  He has a history of GERD well controlled with a proton pump inhibitor.  He takes amlodipine and hydrochlorothiazide for longstanding hypertension.  He does not have diabetes mellitus or previously diagnosed hypercholesterolemia and does not smoke.  Past  Medical History:  Diagnosis Date  . Baker's cyst of knee    left  . Cancer of appendix ([place])   . Moderate persistent asthma   . Prostate cancer ([place])   . Ureteral stone     Past Surgical History:  Procedure Laterality Date  . APPENDECTOMY    . cartilage surgery in nose    . IR PERC CHOLECYSTOSTOMY  01/17/2019    Current  Medications: Current Meds  Medication Sig  . albuterol (PROVENTIL HFA;VENTOLIN HFA) 108 (90 BASE) MCG/ACT inhaler Inhale 2 puffs into the lungs every 6 (six) hours as needed for wheezing or shortness of breath.  Marland Kitchen amLODipine (NORVASC) 5 MG tablet Take 5 mg by mouth daily.   Marland Kitchen apixaban (ELIQUIS) 2.5 MG TABS tablet Take 1 tablet (2.5 mg total) by mouth 2 (two) times daily.  . Doxylamine Succinate, Sleep, (SLEEP AID PO) Take 2 tablets by mouth at bedtime.  . hydrochlorothiazide (HYDRODIURIL) 25 MG tablet Take 1 tablet (25 mg total) by mouth daily.  Marland Kitchen HYDROcodone-acetaminophen (NORCO/VICODIN) 5-325 MG tablet Take 1-2 tablets by mouth every 6 (six) hours as needed for moderate pain.  . pantoprazole (PROTONIX) 40 MG tablet Take 1 tablet (40 mg total) by mouth daily.  . tamsulosin (FLOMAX) 0.4 MG CAPS capsule Take 0.4 mg by mouth.  . TRAVATAN Z 0.004 % SOLN ophthalmic solution Place 1 drop into both eyes 3 (three) times daily.      Allergies:   Patient has no known allergies.   Social History   Socioeconomic History  . Marital status: Single    Spouse name: Not on file  . Number of children: Not on file  . Years of education: Not on file  . Highest education level: Not on file  Occupational History  . Occupation: IT trainer    Comment: [employer]  Social Needs  . Financial resource strain: Not on file  . Food insecurity    Worry: Not on file    Inability: Not on file  . Transportation needs    Medical: Not on file    Non-medical: Not on file  Tobacco Use  . Smoking status: Former Smoker    Packs/day: 1.00    Years: 20.00    Pack years: 20.00    Types: Cigarettes    Quit date: 03/20/1978    Years since quitting: 40.9  . Smokeless tobacco: Never Used  Substance and Sexual Activity  . Alcohol use: Yes    Alcohol/week: 1.0 standard drinks    Types: 1 Cans of beer per week    Comment: occ  . Drug use: No  . Sexual activity: Not on file  Lifestyle  . Physical activity     Days per week: Not on file    Minutes per session: Not on file  . Stress: Not on file  Relationships  . Social Herbalist on phone: Not on file    Gets together: Not on file    Attends religious service: Not on file    Active member of club or organization: Not on file    Attends meetings of clubs or organizations: Not on file    Relationship status: Not on file  Other Topics Concern  . Not on file  Social History Narrative  . Not on file     Family History: The patient's family history includes Asthma in his son; Heart disease in his mother; Prostate cancer in his father.  ROS:   Please see  the history of present illness.     All other systems reviewed and are negative.  EKGs/Labs/Other Studies Reviewed:    The following studies were reviewed today: Extensive review of notes from multiple recent emergency room visits and hospitalization, CT scans of the abdomen, HIDA scan, right upper quadrant ultrasound, surgical consultation notes  EKG:  EKG is not ordered today.  The ekg ordered 01/15/2019 today demonstrates normal sinus rhythm, normal tracing.  Her ECG tracings show similar findings.   EMS twelve-lead ECG from 01/13/2019 at 12: 16: 40 6 AM shows atrial fibrillation with rapid ventricular rate and diffuse nonspecific ST-T wave changes.  Recent Labs: 01/15/2019: Magnesium 2.1 01/16/2019: ALT 14 01/18/2019: BUN 6; Creatinine, Ser 1.03; Hemoglobin 10.6; Platelets 277; Potassium 4.0; Sodium 140  Recent Lipid Panel No results found for: CHOL, TRIG, HDL, CHOLHDL, VLDL, LDLCALC, LDLDIRECT  Physical Exam:    VS:  BP (!) 110/56   Pulse 74   Ht 5\' 9"  (1.753 m)   Wt 181 lb (82.1 kg)   SpO2 97%   BMI 26.73 kg/m     Wt Readings from Last 3 Encounters:  02/18/19 181 lb (82.1 kg)  01/15/19 183 lb 3.2 oz (83.1 kg)  01/14/19 182 lb 9.6 oz (82.8 kg)     GEN: Appears younger than stated age and fit well nourished, well developed in no acute distress HEENT: Normal  NECK: No JVD; No carotid bruits LYMPHATICS: No lymphadenopathy CARDIAC: RRR, no murmurs, rubs, gallops RESPIRATORY:  Clear to auscultation without rales, wheezing or rhonchi  ABDOMEN: Soft, non-tender, non-distended.  Right upper quadrant cholecystostomy tube MUSCULOSKELETAL:  No edema; No deformity  SKIN: Warm and dry NEUROLOGIC:  Alert and oriented x 3 PSYCHIATRIC:  Normal affect   ASSESSMENT:    1. Paroxysmal atrial fibrillation (HCC)   2. Preoperative cardiovascular examination   3. Long term current use of anticoagulant   4. Essential hypertension   5. Aortic atherosclerosis ([place])   6. Prostate cancer ([place])    PLAN:    In order of problems listed above:  1. AFib: He was never aware of the palpitations.  It is quite possible that he has had other episodes of atrial fibrillation but he is not aware of.  For the time being, I think we need to consider him at risk for recurrent atrial fibrillation and intend to prescribe long-term anticoagulation.  However at this point, it will be safe to temporarily interrupt anticoagulation to allow cholecystectomy, with low risk of embolic events.  Antiarrhythmic medications do not appear indicated for what appears to be an otherwise infrequent and asymptomatic arrhythmia.  I do not think it would be wise to start beta-blockers so soon before planned surgery, although these would be a great choice for long-term management of arrhythmia and hypertension. 2. Preop CV risk eval: I believe he is at low to moderate risk for major cardiovascular complications with cholecystectomy.  This risk is primarily related to his age.  Otherwise he has really good functional status.  I do not think additional stress testing is necessary.  An echocardiogram would be useful, but should not delay the cholecystectomy. 3. Anticoagulation: Eliquis should be stopped 48-72 hours before the planned abdominal surgery.  It can then be restarted as soon as the surgeon believes  this is safe.  We should restart at the 5 mg twice daily dose since he actually has normal renal function. 4. HTN: Well treated.  As described above in the long run he would be better  served by beta-blockers, rather than calcium channel blocker and diuretic. 5. Aortic atherosclerosis: This increases the likelihood that he has other sites of atherosclerosis including coronary disease, but as mentioned further evaluation is not indicated prior to his cholecystectomy since he is asymptomatic and has excellent functional status.  It will be important to check his lipid profile and prescribe lipid-lowering therapy if appropriate.  This can wait until after his abdominal surgery. 6. Prostate cancer: Resume XRT after abdominal surgery.   Medication Adjustments/Labs and Tests Ordered: Current medicines are reviewed at length with the patient today.  Concerns regarding medicines are outlined above.  Orders Placed This Encounter  Procedures  . ECHOCARDIOGRAM COMPLETE   No orders of the defined types were placed in this encounter.   Patient Instructions  Medication Instructions:  No changes *If you need a refill on your cardiac medications before your next appointment, please call your pharmacy*  Lab Work: None ordered If you have labs (blood work) drawn today and your tests are completely normal, you will receive your results only by: Marland Kitchen MyChart Message (if you have MyChart) OR . A paper copy in the mail If you have any lab test that is abnormal or we need to change your treatment, we will call you to review the results.  Testing/Procedures: Your physician has requested that you have an echocardiogram. Echocardiography is a painless test that uses sound waves to create images of your heart. It provides your doctor with information about the size and shape of your heart and how well your heart's chambers and valves are working. You may receive an ultrasound enhancing agent through an IV if needed to  better visualize your heart during the echo.This procedure takes approximately one hour. There are no restrictions for this procedure. This will take place at the [address].    Follow-Up: At [facility], you and your health needs are our priority.  As part of our continuing mission to provide you with exceptional heart care, we have created designated Provider Care Teams.  These Care Teams include your primary Cardiologist (physician) and Advanced Practice Providers (APPs -  Physician Assistants and Nurse Practitioners) who all work together to provide you with the care you need, when you need it.  Your next appointment:   2 month(s)  The format for your next appointment:   In Person  Provider:   Sanda Klein, MD       Signed, Sanda Klein, MD  02/18/2019 6:45 PM    [place]

## 2019-02-18 NOTE — Patient Instructions (Signed)
Medication Instructions:  No changes *If you need a refill on your cardiac medications before your next appointment, please call your pharmacy*  Lab Work: None ordered If you have labs (blood work) drawn today and your tests are completely normal, you will receive your results only by: Marland Kitchen MyChart Message (if you have MyChart) OR . A paper copy in the mail If you have any lab test that is abnormal or we need to change your treatment, we will call you to review the results.  Testing/Procedures: Your physician has requested that you have an echocardiogram. Echocardiography is a painless test that uses sound waves to create images of your heart. It provides your doctor with information about the size and shape of your heart and how well your heart's chambers and valves are working. You may receive an ultrasound enhancing agent through an IV if needed to better visualize your heart during the echo.This procedure takes approximately one hour. There are no restrictions for this procedure. This will take place at the [address], [address].    Follow-Up: At [facility], you and your health needs are our priority.  As part of our continuing mission to provide you with exceptional heart care, we have created designated Provider Care Teams.  These Care Teams include your primary Cardiologist (physician) and Advanced Practice Providers (APPs -  Physician Assistants and Nurse Practitioners) who all work together to provide you with the care you need, when you need it.  Your next appointment:   2 month(s)  The format for your next appointment:   In Person  Provider:   Sanda Klein, MD

## 2019-02-19 ENCOUNTER — Ambulatory Visit
Admission: RE | Admit: 2019-02-19 | Discharge: 2019-02-19 | Disposition: A | Discharge status: Home / Self Care | Payer: Medicare Other | Source: Ambulatory Visit | Attending: Surgery | Admitting: Surgery

## 2019-02-19 ENCOUNTER — Other Ambulatory Visit (HOSPITAL_COMMUNITY): Payer: Self-pay | Admitting: Diagnostic Radiology

## 2019-02-19 ENCOUNTER — Encounter: Payer: Self-pay | Admitting: Radiology

## 2019-02-19 ENCOUNTER — Ambulatory Visit
Admission: RE | Admit: 2019-02-19 | Discharge: 2019-02-19 | Disposition: A | Discharge status: Home / Self Care | Payer: Medicare Other | Source: Ambulatory Visit | Attending: Radiology | Admitting: Radiology

## 2019-02-19 DIAGNOSIS — K819 Cholecystitis, unspecified: Secondary | ICD-10-CM

## 2019-02-19 HISTORY — PX: IR RADIOLOGIST EVAL & MGMT: IMG5224

## 2019-02-19 NOTE — Progress Notes (Signed)
Chief Complaint: Patient was seen in consultation today for cholecystostomy tube follow-up  Referring Physician(s): Turpin,Pamela  History of Present Illness: John Livingston is a 78 y.o. male with history of acute cholecystitis and underwent percutaneous cholecystostomy tube placement on 01/17/2019.  Patient was discharged from the hospital on 01/18/2019 and has been doing well since his discharge.  He denies fevers or chills.  No significant abdominal pain.  Main complaint is mild discomfort at the tube site.  He has been flushing the drain daily and notes discomfort while flushing. He also notes that the flush is refluxing back to the skin.  He reports minimal output from the drain for the past few days.  Patient recently saw Dr. Ninfa Linden in general surgery and he is being scheduled for a cholecystectomy.  Past Medical History:  Diagnosis Date  . Baker's cyst of knee    left  . Cancer of appendix ([place])   . Moderate persistent asthma   . Prostate cancer ([place])   . Ureteral stone     Past Surgical History:  Procedure Laterality Date  . APPENDECTOMY    . cartilage surgery in nose    . IR PERC CHOLECYSTOSTOMY  01/17/2019  . IR RADIOLOGIST EVAL & MGMT  02/19/2019    Allergies: Patient has no known allergies.  Medications: Prior to Admission medications   Medication Sig Start Date End Date Taking? Authorizing Provider  albuterol (PROVENTIL HFA;VENTOLIN HFA) 108 (90 BASE) MCG/ACT inhaler Inhale 2 puffs into the lungs every 6 (six) hours as needed for wheezing or shortness of breath. 04/18/12   Elsie Stain, MD  amLODipine (NORVASC) 5 MG tablet Take 5 mg by mouth daily.     [provider]  apixaban (ELIQUIS) 2.5 MG TABS tablet Take 1 tablet (2.5 mg total) by mouth 2 (two) times daily. 02/17/19 03/19/19  Sherran Needs, NP  Doxylamine Succinate, Sleep, (SLEEP AID PO) Take 2 tablets by mouth at bedtime.    [provider]  hydrochlorothiazide (HYDRODIURIL)  25 MG tablet Take 1 tablet (25 mg total) by mouth daily. 04/30/13   Palumbo, April, MD  HYDROcodone-acetaminophen (NORCO/VICODIN) 5-325 MG tablet Take 1-2 tablets by mouth every 6 (six) hours as needed for moderate pain. 01/18/19   Mikhail, Velta Addison, DO  pantoprazole (PROTONIX) 40 MG tablet Take 1 tablet (40 mg total) by mouth daily. 01/19/19   Mikhail, Velta Addison, DO  tamsulosin (FLOMAX) 0.4 MG CAPS capsule Take 0.4 mg by mouth.    [provider]  TRAVATAN Z 0.004 % SOLN ophthalmic solution Place 1 drop into both eyes 3 (three) times daily.     [provider]     Family History  Problem Relation Age of Onset  . Asthma Son        as a child  . Heart disease Mother   . Prostate cancer Father     Social History   Socioeconomic History  . Marital status: Single    Spouse name: Not on file  . Number of children: Not on file  . Years of education: Not on file  . Highest education level: Not on file  Occupational History  . Occupation: IT trainer    Comment: [employer]  Social Needs  . Financial resource strain: Not on file  . Food insecurity    Worry: Not on file    Inability: Not on file  . Transportation needs    Medical: Not on file    Non-medical: Not on  file  Tobacco Use  . Smoking status: Former Smoker    Packs/day: 1.00    Years: 20.00    Pack years: 20.00    Types: Cigarettes    Quit date: 03/20/1978    Years since quitting: 40.9  . Smokeless tobacco: Never Used  Substance and Sexual Activity  . Alcohol use: Yes    Alcohol/week: 1.0 standard drinks    Types: 1 Cans of beer per week    Comment: occ  . Drug use: No  . Sexual activity: Not on file  Lifestyle  . Physical activity    Days per week: Not on file    Minutes per session: Not on file  . Stress: Not on file  Relationships  . Social Herbalist on phone: Not on file    Gets together: Not on file    Attends religious service: Not on file    Active member of club or  organization: Not on file    Attends meetings of clubs or organizations: Not on file    Relationship status: Not on file  Other Topics Concern  . Not on file  Social History Narrative  . Not on file    Review of Systems  Constitutional: Negative.   Gastrointestinal: Negative for abdominal distention.    Vital Signs: There were no vitals taken for this visit.  Physical Exam Constitutional:      Appearance: Normal appearance. He is not ill-appearing.  Abdominal:     General: Abdomen is flat. There is no distension.     Palpations: Abdomen is soft.     Comments: Mild redness at the tube exit site. Mild tenderness at the tube site.  Neurological:     Mental Status: He is alert.        Imaging: Dg Sinus/fist Tube Chk-non Gi  Result Date: 02/19/2019 INDICATION: 78 year old with history of acute cholecystitis and percutaneous cholecystostomy tube placement on 01/17/2019. Patient presents for follow-up imaging. Patient complains of discomfort when flushing the tube and having the flush leak around the tube. Patient recently saw general surgery and is being scheduled for cholecystectomy. EXAM: CHOLECYSTOSTOMY TUBE INJECTION MEDICATIONS: None ANESTHESIA/SEDATION: None COMPLICATIONS: None immediate. PROCEDURE: Patient was placed supine on the fluoroscopic table. Cholecystostomy tube was injected with 4 mL of Omnipaque 300. Catheter was flushed with sterile saline at the end of the procedure. Catheter was attached to the gravity bag at the end of the procedure. FINDINGS: Compared to the initial placement images, the catheter has been slightly retracted. Small amount of contrast fills the gallbladder lumen. Contrast drains around the catheter to the skin exit site. Minimal filling of the gallbladder and no filling of the cystic duct. IMPRESSION: Gallbladder catheter has been partially retracted. Catheter is still in the gallbladder but contrast is leaking around the tube to the skin site. Will  schedule the patient for an outpatient tube exchange. Repeat cholangiogram can be performed when the tube is exchanged to evaluate for cystic duct patency. Electronically Signed   By: Markus Daft M.D.   On: 02/19/2019 10:54   Ir Radiologist Eval & Mgmt  Result Date: 02/19/2019 Please refer to notes tab for details about interventional procedure. (Op Note)   Labs:  CBC: Recent Labs    01/15/19 1101 01/16/19 0533 01/17/19 0548 01/18/19 0440  WBC 10.1 9.7 8.1 5.1  HGB 11.9* 10.9* 11.1* 10.6*  HCT 33.7* 31.1* 32.0* 30.8*  PLT 207 212 239 277    COAGS: Recent Labs  01/16/19 0533  INR 1.3*    BMP: Recent Labs    01/15/19 1101 01/16/19 0533 01/17/19 0548 01/18/19 0440  NA 134* 136 136 140  K 3.4* 3.8 4.0 4.0  CL 101 106 103 106  CO2 25 23 23 22   GLUCOSE 127* 116* 118* 109*  BUN 19 13 8  6*  CALCIUM 8.7* 8.3* 8.4* 8.5*  CREATININE 1.46* 1.28* 1.10 1.03  GFRNONAA 45* 53* >60 >60  GFRAA 52* >60 >60 >60    LIVER FUNCTION TESTS: Recent Labs    01/10/19 1825 01/13/19 0220 01/15/19 1101 01/16/19 0533  BILITOT 0.7 2.0* 0.8 0.9  AST 21 33 15 17  ALT 15 23 14 14   ALKPHOS 54 42 58 78  PROT 6.4* 6.4* 6.3* 5.8*  ALBUMIN 4.1 3.3* 2.9* 2.6*    TUMOR MARKERS: No results for input(s): AFPTM, CEA, CA199, CHROMGRNA in the last 8760 hours.  Assessment and Plan:  78 year old with history of cholecystitis and status post percutaneous cholecystostomy tube placement.  Patient has been doing well since his hospital discharge and reportedly scheduled to have a cholecystectomy in the near future.  Drain injection today revealed that the tube is partially retracted and limits evaluation of the gallbladder lumen.  Cannot evaluate the cystic duct during the tube injection.  Based on the injection findings, I feel the patient would benefit from a drain exchange and repeat cholangiogram.  Hopefully the drain exchange will last until the patient can undergo cholecystectomy.  We will  schedule the patient for outpatient cholecystostomy tube drain exchange in the near future.   Electronically Signed: Burman Riis 02/19/2019, 11:00 AM   I spent a total of    10 Minutes in face to face in clinical consultation, greater than 50% of which was counseling/coordinating care for cholecystostomy tube care.  Patient ID: John Livingston, male   DOB: 1940/07/31, 78 y.o.   MRN: [id number]

## 2019-02-21 ENCOUNTER — Encounter (HOSPITAL_COMMUNITY): Payer: Self-pay | Admitting: Interventional Radiology

## 2019-02-21 ENCOUNTER — Ambulatory Visit (HOSPITAL_COMMUNITY)
Admission: RE | Admit: 2019-02-21 | Discharge: 2019-02-21 | Disposition: A | Discharge status: Home / Self Care | Payer: Medicare Other | Source: Ambulatory Visit | Attending: Diagnostic Radiology | Admitting: Diagnostic Radiology

## 2019-02-21 ENCOUNTER — Other Ambulatory Visit: Payer: Self-pay

## 2019-02-21 DIAGNOSIS — Z4803 Encounter for change or removal of drains: Secondary | ICD-10-CM | POA: Diagnosis present

## 2019-02-21 DIAGNOSIS — K819 Cholecystitis, unspecified: Secondary | ICD-10-CM

## 2019-02-21 DIAGNOSIS — K8020 Calculus of gallbladder without cholecystitis without obstruction: Secondary | ICD-10-CM | POA: Diagnosis present

## 2019-02-21 HISTORY — PX: IR EXCHANGE BILIARY DRAIN: IMG6046

## 2019-02-21 MED ORDER — LIDOCAINE HCL (PF) 1 % IJ SOLN
INTRAMUSCULAR | Status: AC | PRN
  Administered 2019-02-21: 10 mL

## 2019-02-21 MED ORDER — IOHEXOL 300 MG/ML  SOLN
50.0000 mL | Freq: Once | INTRAMUSCULAR | Status: AC | PRN
  Administered 2019-02-21: 14:00:00 12 mL

## 2019-02-21 MED ORDER — LIDOCAINE HCL 1 % IJ SOLN
INTRAMUSCULAR | Status: AC
  Filled 2019-02-21: qty 20

## 2019-02-21 NOTE — Procedures (Signed)
Interventional Radiology Procedure Note  Procedure: Routine exchange of 12F cholecystostomy tube  Complications: None  Estimated Blood Loss: None  Recommendations: - DC home - Tube change in 8 weeks unless interval cholecystectomy  Signed,  Criselda Peaches, MD

## 2019-02-24 ENCOUNTER — Other Ambulatory Visit (HOSPITAL_COMMUNITY): Payer: Self-pay | Admitting: Diagnostic Radiology

## 2019-02-24 ENCOUNTER — Encounter (HOSPITAL_COMMUNITY): Payer: Self-pay

## 2019-02-24 ENCOUNTER — Ambulatory Visit (HOSPITAL_COMMUNITY): Payer: Medicare Other | Attending: Cardiology

## 2019-02-24 ENCOUNTER — Other Ambulatory Visit: Payer: Self-pay

## 2019-02-24 DIAGNOSIS — K819 Cholecystitis, unspecified: Secondary | ICD-10-CM

## 2019-02-24 DIAGNOSIS — I480 Paroxysmal atrial fibrillation: Secondary | ICD-10-CM | POA: Insufficient documentation

## 2019-02-27 ENCOUNTER — Other Ambulatory Visit (HOSPITAL_COMMUNITY): Payer: Medicare Other

## 2019-02-28 NOTE — Progress Notes (Signed)
Anesthesia Chart Review:  Follows with cardiology for recent diagnosis of paroxysmal afib. Seen by Dr. Sallyanne Kuster 02/18/19 and preop clearance was addressed per note: 1. Preop CV risk eval: I believe he is at low to moderate risk for major cardiovascular complications with cholecystectomy.  This risk is primarily related to his age.  Otherwise he has really good functional status.  I do not think additional stress testing is necessary.  An echocardiogram would be useful, but should not delay the cholecystectomy. 2. Anticoagulation: Eliquis should be stopped 48-72 hours before the planned abdominal surgery.  It can then be restarted as soon as the surgeon believes this is safe.  We should restart at the 5 mg twice daily dose since he actually has normal renal function.  Pt did subsequently have echo showing normal LVEF 55-60%, grade 1 dd, no significant valvular abnormalities.  Will need DOS labs and eval.  EKG 01/27/19: Sinus bradycardia. Rate 58.  TTE 02/24/19:  1. Left ventricular ejection fraction, by visual estimation, is 55 to 60%. The left ventricle has normal function. There is mildly increased left ventricular hypertrophy.  2. Left ventricular diastolic parameters are consistent with Grade I diastolic dysfunction (impaired relaxation).  3. The left ventricle has no regional wall motion abnormalities.  4. Global right ventricle has normal systolic function.The right ventricular size is normal. No increase in right ventricular wall thickness.  5. Left atrial size was normal.  6. Right atrial size was normal.  7. The mitral valve is normal in structure. No evidence of mitral valve regurgitation. No evidence of mitral stenosis.  8. The tricuspid valve is normal in structure. Tricuspid valve regurgitation is not demonstrated.  9. The aortic valve is tricuspid. Aortic valve regurgitation is not visualized. Mild aortic valve sclerosis without stenosis. 10. 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 11. TR signal is inadequate for assessing pulmonary artery systolic pressure.   [place], [facility] Short Stay Center/Anesthesiology Phone [phone] 02/28/2019 12:53 PM

## 2019-03-03 ENCOUNTER — Other Ambulatory Visit (HOSPITAL_COMMUNITY)
Admission: RE | Admit: 2019-03-03 | Discharge: 2019-03-03 | Disposition: A | Discharge status: Home / Self Care | Payer: Medicare Other | Source: Ambulatory Visit | Attending: Surgery | Admitting: Surgery

## 2019-03-03 DIAGNOSIS — Z01812 Encounter for preprocedural laboratory examination: Secondary | ICD-10-CM | POA: Insufficient documentation

## 2019-03-03 DIAGNOSIS — Z20828 Contact with and (suspected) exposure to other viral communicable diseases: Secondary | ICD-10-CM | POA: Insufficient documentation

## 2019-03-03 NOTE — Pre-Procedure Instructions (Signed)
John Livingston  03/03/2019      [id number] - [place], [place] - [id number] AT [place] [address] Phone: [phone] Fax: [phone]    Your procedure is scheduled on March 06, 2019.  Report to [facility] Entr[facility] "A" at 645 AM.  Call this number if you have problems the morning of surgery:  [phone]   Call [phone] if you have any questions prior to your surgery date Monday-Friday 8am-4pm    Remember:  Do not eat after midnight.   You may drink clear liquids until 5:45 AM the morning of surgery.  Clear liquids allowed are:   Water, Juice (non-citric and without pulp), Clear Tea, Black Coffee only and Gatorade- DO NOT ADD SUGAR OR MILK    Take these medicines the morning of surgery with A SIP OF WATER  Amlodipine (norvasc) Omeprazole (prilosec) Tamsulosin (Flomax) Travatan Eye drops  IF NEEDED Tylenol  Beginning now, STOP taking any Aspirin (unless otherwise instructed by your surgeon), Aleve, Naproxen, Ibuprofen, Motrin, Advil, Goody's, BC's, all herbal medications, fish oil, and all vitamins.  Follow your surgeon's instructions on when to hold/resume Eliquis.  If no instructions were given call the office to determine how they would like to you take Eliquis  Day of surgery:   Do not wear jewelry  Do not wear lotions, powders, or colognes, or deodorant.   Men may shave face and neck.  Do not bring valuables to the hospital.  [facility] is not responsible for any belongings or valuables. IF you are a smoker, DO NOT Smoke 24 hours prior to surgery   IF you wear a CPAP at night please bring your mask, tubing, and machine the morning of surgery    Remember that you must have someone to transport you home after your surgery, and remain with you for 24 hours if you are discharged the same day. Contacts, dentures or bridgework may not be worn into surgery.  Leave your suitcase in the car.   After surgery it may be brought to your room.  For patients admitted to the hospital, discharge time will be determined by your treatment team.  Patients discharged the day of surgery will not be allowed to drive home.    [facility]- Preparing For Surgery  Before surgery, you can play an important role. Because skin is not sterile, your skin needs to be as free of germs as possible. You can reduce the number of germs on your skin by washing with CHG (chlorahexidine gluconate) Soap before surgery.  CHG is an antiseptic cleaner which kills germs and bonds with the skin to continue killing germs even after washing.    Oral Hygiene is also important to reduce your risk of infection.  Remember - BRUSH YOUR TEETH THE MORNING OF SURGERY WITH YOUR REGULAR TOOTHPASTE  Please do not use if you have an allergy to CHG or antibacterial soaps. If your skin becomes reddened/irritated stop using the CHG.  Do not shave (including legs and underarms) for at least 48 hours prior to first CHG shower. It is OK to shave your face.  Please follow these instructions carefully.   1. Shower the NIGHT BEFORE SURGERY and the MORNING OF SURGERY with CHG.   2. If you chose to wash your hair, wash your hair first as usual with your normal shampoo.  3. After you shampoo, rinse your hair and body thoroughly to remove the shampoo.  4. Use CHG as you would any other liquid soap. You can apply CHG directly to the skin and wash gently with a scrungie or a clean washcloth.   5. Apply the CHG Soap to your body ONLY FROM THE NECK DOWN.  Do not use on open wounds or open sores. Avoid contact with your eyes, ears, mouth and genitals (private parts). Wash Face and genitals (private parts)  with your normal soap.  6. Wash thoroughly, paying special attention to the area where your surgery will be performed.  7. Thoroughly rinse your body with warm water from the neck down.  8. DO NOT shower/wash with your normal soap after  using and rinsing off the CHG Soap.  9. Pat yourself dry with a CLEAN TOWEL.  10. Wear CLEAN PAJAMAS to bed the night before surgery, wear comfortable clothes the morning of surgery  11. Place CLEAN SHEETS on your bed the night of your first shower and DO NOT SLEEP WITH PETS.  Day of Surgery: Shower with CHG as instructed above Do not apply any deodorants/lotions.  Please wear clean clothes to the hospital/surgery center.   Remember to brush your teeth WITH YOUR REGULAR TOOTHPASTE.   Please read over the following fact sheets that you were given.

## 2019-03-04 ENCOUNTER — Encounter (HOSPITAL_COMMUNITY)
Admission: RE | Admit: 2019-03-04 | Discharge: 2019-03-04 | Disposition: A | Discharge status: Home / Self Care | Payer: Medicare Other | Source: Ambulatory Visit | Attending: Surgery | Admitting: Surgery

## 2019-03-04 ENCOUNTER — Other Ambulatory Visit: Payer: Self-pay

## 2019-03-04 ENCOUNTER — Encounter (HOSPITAL_COMMUNITY): Payer: Self-pay

## 2019-03-04 DIAGNOSIS — Z01812 Encounter for preprocedural laboratory examination: Secondary | ICD-10-CM | POA: Diagnosis present

## 2019-03-04 DIAGNOSIS — Z79899 Other long term (current) drug therapy: Secondary | ICD-10-CM | POA: Insufficient documentation

## 2019-03-04 DIAGNOSIS — K810 Acute cholecystitis: Secondary | ICD-10-CM | POA: Diagnosis not present

## 2019-03-04 DIAGNOSIS — I10 Essential (primary) hypertension: Secondary | ICD-10-CM | POA: Insufficient documentation

## 2019-03-04 DIAGNOSIS — Z87891 Personal history of nicotine dependence: Secondary | ICD-10-CM | POA: Diagnosis not present

## 2019-03-04 DIAGNOSIS — K219 Gastro-esophageal reflux disease without esophagitis: Secondary | ICD-10-CM | POA: Insufficient documentation

## 2019-03-04 HISTORY — DX: Personal history of urinary calculi: Z87.442

## 2019-03-04 HISTORY — DX: Essential (primary) hypertension: I10

## 2019-03-04 HISTORY — DX: Gastro-esophageal reflux disease without esophagitis: K21.9

## 2019-03-04 LAB — CBC
HCT: 34.7 % — ABNORMAL LOW (ref 39.0–52.0)
Hemoglobin: 11.7 g/dL — ABNORMAL LOW (ref 13.0–17.0)
MCH: 30.5 pg (ref 26.0–34.0)
MCHC: 33.7 g/dL (ref 30.0–36.0)
MCV: 90.4 fL (ref 80.0–100.0)
Platelets: 200 10*3/uL (ref 150–400)
RBC: 3.84 MIL/uL — ABNORMAL LOW (ref 4.22–5.81)
RDW: 14.2 % (ref 11.5–15.5)
WBC: 4.1 10*3/uL (ref 4.0–10.5)
nRBC: 0 % (ref 0.0–0.2)

## 2019-03-04 LAB — PROTIME-INR
INR: 1.1 (ref 0.8–1.2)
Prothrombin Time: 13.9 seconds (ref 11.4–15.2)

## 2019-03-04 LAB — BASIC METABOLIC PANEL
Anion gap: 8 (ref 5–15)
BUN: 14 mg/dL (ref 8–23)
CO2: 27 mmol/L (ref 22–32)
Calcium: 9.5 mg/dL (ref 8.9–10.3)
Chloride: 105 mmol/L (ref 98–111)
Creatinine, Ser: 1 mg/dL (ref 0.61–1.24)
GFR calc Af Amer: >60 mL/min (ref >60)
GFR calc non Af Amer: >60 mL/min (ref >60)
Glucose, Bld: 118 mg/dL — ABNORMAL HIGH (ref 70–99)
Potassium: 4.5 mmol/L (ref 3.5–5.1)
Sodium: 140 mmol/L (ref 135–145)

## 2019-03-04 LAB — NOVEL CORONAVIRUS, NAA (HOSP ORDER, SEND-OUT TO REF LAB; TAT 18-24 HRS): SARS-CoV-2, NAA: NOT DETECTED

## 2019-03-04 NOTE — Progress Notes (Signed)
PCP - Poschinsky, richard Cardiologist - Croitoru  PPM/ICD - na Device Orders -  Rep Notified -   Chest x-ray - 01/10/19 EKG - 01/27/19 Stress Test - > 6 yrs ECHO - 02/24/19 Cardiac Cath - na  Sleep Study - na CPAP -   Fasting Blood Sugar - na Checks Blood Sugar _____ times a day  Blood Thinner Instructions: last dose eliquis 03/01/19 Aspirin Instructions:na  ERAS Protcol -yes PRE-SURGERY Ensure or G2- na  COVID TEST- 03/03/19   Anesthesia review: cardiac hx.  Patient denies shortness of breath, fever, cough and chest pain at PAT appointment   All instructions explained to the patient, with a verbal understanding of the material. Patient agrees to go over the instructions while at home for a better understanding. Patient also instructed to self quarantine after being tested for COVID-19. The opportunity to ask questions was provided.

## 2019-03-05 NOTE — H&P (Signed)
   John Livingston Documented: 02/17/2019 1:53 PM Location: [facility] Patient #: [id number] DOB: [birth year] Married / Language: English / Race: White Male   History of Present Illness (Lurleen Soltero A. Ninfa Linden MD; 02/17/2019 2:17 PM) The patient is a 78 year old male who presents with symptomatic choledocholithiasis. He is here for a hospital follow-up. He showed up to our office with acute cholecystitis and I sent him straight to the emergency department. I do not take care of in the hospital of my partners did. During that hospitalization, he had a percutaneous cholecystostomy tube placed. He is on Eliquis. He has pain only at the drain site. He reports it has not been draining for the last day. He has no nausea or vomiting. He drain has required repositioning by IR.   Allergies Malachi Bonds, CMA; 02/17/2019 1:55 PM) No Known Allergies [01/15/2019]:  Medication History Malachi Bonds, CMA; 02/17/2019 1:55 PM) Keflex (500MG  Capsule, 1 (one) Oral three times daily, Taken starting 02/17/2019) Active. HYDROcodone-Acetaminophen (5-325MG  Tablet, Oral) Active. metroNIDAZOLE (500MG  Tablet, Oral) Active. Omeprazole (40MG  Capsule DR, Oral) Active. Ondansetron (4MG  Tablet Disint, Oral) Active. amLODIPine Besylate (5MG  Tablet, Oral) Active. Tamsulosin HCl (0.4MG  Capsule, Oral) Active. Medications Reconciled  Vitals (Chemira Jones CMA; 02/17/2019 1:55 PM) 02/17/2019 1:55 PM Weight: 181.6 lb Height: 67in Body Surface Area: 1.94 m Body Mass Index: 28.44 kg/m  BP: 136/80 (Sitting, Left Arm, Standard)       Physical Exam (Eoin Willden A. Ninfa Linden MD; 02/17/2019 2:18 PM) The physical exam findings are as follows: Note:Today, he actually looks pretty well. He is comfortable. He looks much better than his last office visit. His abdomen is soft and nontender. The drain site is stable. Lungs clear CV RRR Drain site with some erythema Ext warm, no  edema    Assessment & Plan   ACUTE CHOLECYSTITIS (K81.0)  He has had his drain replaced and is having significant discomfort from the drain.  We will now need to proceed to the OR for a laparoscopic cholecystectomy. I discussed the procedure in detail.  The patient was given Neurosurgeon.  We discussed the risks and benefits of a laparoscopic cholecystectomy and possible cholangiogram including, but not limited to bleeding, infection, injury to surrounding structures such as the intestine or liver, bile leak, retained gallstones, need to convert to an open procedure, prolonged diarrhea, blood clots such as  DVT, common bile duct injury, anesthesia risks, and possible need for additional procedures.  He is at a much higher risk for ongoing cholecystitis without surgery and is also at a higher risk of the need for conversion to an open procedure.  His surgery will need to be done in the main OR given this risk.

## 2019-03-06 ENCOUNTER — Other Ambulatory Visit: Payer: Medicare Other

## 2019-03-06 ENCOUNTER — Ambulatory Visit (HOSPITAL_COMMUNITY): Payer: Medicare Other | Admitting: Physician Assistant

## 2019-03-06 ENCOUNTER — Other Ambulatory Visit: Payer: Self-pay

## 2019-03-06 ENCOUNTER — Encounter (HOSPITAL_COMMUNITY): Payer: Self-pay | Admitting: Surgery

## 2019-03-06 ENCOUNTER — Ambulatory Visit (HOSPITAL_COMMUNITY)
Admission: RE | Admit: 2019-03-06 | Discharge: 2019-03-06 | Disposition: A | Discharge status: Home / Self Care | Payer: Medicare Other | Source: Ambulatory Visit | Attending: Surgery | Admitting: Surgery

## 2019-03-06 ENCOUNTER — Encounter (HOSPITAL_COMMUNITY)
Admission: RE | Disposition: A | Discharge status: Home / Self Care | Payer: Self-pay | Source: Ambulatory Visit | Attending: Surgery

## 2019-03-06 DIAGNOSIS — Z87891 Personal history of nicotine dependence: Secondary | ICD-10-CM | POA: Insufficient documentation

## 2019-03-06 DIAGNOSIS — K8012 Calculus of gallbladder with acute and chronic cholecystitis without obstruction: Secondary | ICD-10-CM | POA: Insufficient documentation

## 2019-03-06 DIAGNOSIS — I10 Essential (primary) hypertension: Secondary | ICD-10-CM | POA: Diagnosis not present

## 2019-03-06 DIAGNOSIS — K219 Gastro-esophageal reflux disease without esophagitis: Secondary | ICD-10-CM | POA: Insufficient documentation

## 2019-03-06 DIAGNOSIS — Z79899 Other long term (current) drug therapy: Secondary | ICD-10-CM | POA: Insufficient documentation

## 2019-03-06 DIAGNOSIS — Z7901 Long term (current) use of anticoagulants: Secondary | ICD-10-CM | POA: Insufficient documentation

## 2019-03-06 DIAGNOSIS — I480 Paroxysmal atrial fibrillation: Secondary | ICD-10-CM | POA: Insufficient documentation

## 2019-03-06 HISTORY — PX: CHOLECYSTECTOMY: SHX55

## 2019-03-06 SURGERY — LAPAROSCOPIC CHOLECYSTECTOMY WITH INTRAOPERATIVE CHOLANGIOGRAM
Anesthesia: General | Site: Abdomen

## 2019-03-06 MED ORDER — ROCURONIUM BROMIDE 10 MG/ML (PF) SYRINGE
PREFILLED_SYRINGE | INTRAVENOUS | Status: AC
  Filled 2019-03-06: qty 10

## 2019-03-06 MED ORDER — PROPOFOL 10 MG/ML IV BOLUS
INTRAVENOUS | Status: AC
  Filled 2019-03-06: qty 20

## 2019-03-06 MED ORDER — FENTANYL CITRATE (PF) 100 MCG/2ML IJ SOLN
INTRAMUSCULAR | Status: AC
  Filled 2019-03-06: qty 2

## 2019-03-06 MED ORDER — CEFAZOLIN SODIUM-DEXTROSE 2-4 GM/100ML-% IV SOLN
2.0000 g | INTRAVENOUS | Status: AC
  Administered 2019-03-06: 2 g via INTRAVENOUS
  Filled 2019-03-06: qty 100

## 2019-03-06 MED ORDER — LIDOCAINE 2% (20 MG/ML) 5 ML SYRINGE
INTRAMUSCULAR | Status: DC | PRN
  Administered 2019-03-06: 60 mg via INTRAVENOUS

## 2019-03-06 MED ORDER — DEXAMETHASONE SODIUM PHOSPHATE 10 MG/ML IJ SOLN
INTRAMUSCULAR | Status: DC | PRN
  Administered 2019-03-06: 4 mg via INTRAVENOUS

## 2019-03-06 MED ORDER — ROCURONIUM BROMIDE 10 MG/ML (PF) SYRINGE
PREFILLED_SYRINGE | INTRAVENOUS | Status: DC | PRN
  Administered 2019-03-06: 50 mg via INTRAVENOUS

## 2019-03-06 MED ORDER — LACTATED RINGERS IV SOLN: INTRAVENOUS | Status: DC

## 2019-03-06 MED ORDER — FENTANYL CITRATE (PF) 100 MCG/2ML IJ SOLN
25.0000 ug | INTRAMUSCULAR | Status: DC | PRN
  Administered 2019-03-06 (×2): 25 ug via INTRAVENOUS

## 2019-03-06 MED ORDER — PHENYLEPHRINE 40 MCG/ML (10ML) SYRINGE FOR IV PUSH (FOR BLOOD PRESSURE SUPPORT)
PREFILLED_SYRINGE | INTRAVENOUS | Status: DC | PRN
  Administered 2019-03-06: 120 ug via INTRAVENOUS

## 2019-03-06 MED ORDER — SODIUM CHLORIDE 0.9 % IR SOLN
Status: DC | PRN
  Administered 2019-03-06: 1000 mL

## 2019-03-06 MED ORDER — DEXAMETHASONE SODIUM PHOSPHATE 10 MG/ML IJ SOLN
INTRAMUSCULAR | Status: AC
  Filled 2019-03-06: qty 1

## 2019-03-06 MED ORDER — PROPOFOL 10 MG/ML IV BOLUS
INTRAVENOUS | Status: DC | PRN
  Administered 2019-03-06: 150 mg via INTRAVENOUS

## 2019-03-06 MED ORDER — FENTANYL CITRATE (PF) 250 MCG/5ML IJ SOLN
INTRAMUSCULAR | Status: DC | PRN
  Administered 2019-03-06: 50 ug via INTRAVENOUS
  Administered 2019-03-06: 100 ug via INTRAVENOUS

## 2019-03-06 MED ORDER — LIDOCAINE 2% (20 MG/ML) 5 ML SYRINGE
INTRAMUSCULAR | Status: AC
  Filled 2019-03-06: qty 5

## 2019-03-06 MED ORDER — SUGAMMADEX SODIUM 200 MG/2ML IV SOLN
INTRAVENOUS | Status: DC | PRN
  Administered 2019-03-06: 180 mg via INTRAVENOUS

## 2019-03-06 MED ORDER — TRAMADOL HCL 50 MG PO TABS: 50.0000 mg | ORAL_TABLET | Freq: Four times a day (QID) | ORAL | 0 refills | Status: DC | PRN

## 2019-03-06 MED ORDER — 0.9 % SODIUM CHLORIDE (POUR BTL) OPTIME
TOPICAL | Status: DC | PRN
  Administered 2019-03-06: 09:00:00 1000 mL

## 2019-03-06 MED ORDER — HEMOSTATIC AGENTS (NO CHARGE) OPTIME
TOPICAL | Status: DC | PRN
  Administered 2019-03-06: 1 via TOPICAL

## 2019-03-06 MED ORDER — ONDANSETRON HCL 4 MG/2ML IJ SOLN
INTRAMUSCULAR | Status: DC | PRN
  Administered 2019-03-06: 4 mg via INTRAVENOUS

## 2019-03-06 MED ORDER — BUPIVACAINE HCL (PF) 0.25 % IJ SOLN
INTRAMUSCULAR | Status: AC
  Filled 2019-03-06: qty 30

## 2019-03-06 MED ORDER — GABAPENTIN 300 MG PO CAPS
300.0000 mg | ORAL_CAPSULE | ORAL | Status: AC
  Administered 2019-03-06: 300 mg via ORAL
  Filled 2019-03-06: qty 1

## 2019-03-06 MED ORDER — CHLORHEXIDINE GLUCONATE CLOTH 2 % EX PADS: 6.0000 | MEDICATED_PAD | Freq: Once | CUTANEOUS | Status: DC

## 2019-03-06 MED ORDER — ONDANSETRON HCL 4 MG/2ML IJ SOLN
INTRAMUSCULAR | Status: AC
  Filled 2019-03-06: qty 2

## 2019-03-06 MED ORDER — FENTANYL CITRATE (PF) 250 MCG/5ML IJ SOLN
INTRAMUSCULAR | Status: AC
  Filled 2019-03-06: qty 5

## 2019-03-06 MED ORDER — ACETAMINOPHEN 500 MG PO TABS
1000.0000 mg | ORAL_TABLET | ORAL | Status: AC
  Administered 2019-03-06: 1000 mg via ORAL
  Filled 2019-03-06: qty 2

## 2019-03-06 MED ORDER — EPHEDRINE SULFATE 50 MG/ML IJ SOLN
INTRAMUSCULAR | Status: DC | PRN
  Administered 2019-03-06: 10 mg via INTRAVENOUS

## 2019-03-06 MED ORDER — BUPIVACAINE HCL 0.25 % IJ SOLN
INTRAMUSCULAR | Status: DC | PRN
  Administered 2019-03-06: 20 mL

## 2019-03-06 MED ORDER — PHENYLEPHRINE HCL-NACL 10-0.9 MG/250ML-% IV SOLN
INTRAVENOUS | Status: DC | PRN
  Administered 2019-03-06 (×2): 50 ug/min via INTRAVENOUS

## 2019-03-06 SURGICAL SUPPLY — 39 items
APPLIER CLIP 5 13 M/L LIGAMAX5 (MISCELLANEOUS) ×3
BLADE CLIPPER SURG (BLADE) IMPLANT
CANISTER SUCT 3000ML PPV (MISCELLANEOUS) ×3 IMPLANT
CHLORAPREP W/TINT 26 (MISCELLANEOUS) ×3 IMPLANT
CLIP APPLIE 5 13 M/L LIGAMAX5 (MISCELLANEOUS) ×1 IMPLANT
COVER MAYO STAND STRL (DRAPES) IMPLANT
COVER SURGICAL LIGHT HANDLE (MISCELLANEOUS) ×3 IMPLANT
COVER WAND RF STERILE (DRAPES) ×1 IMPLANT
DERMABOND ADVANCED (GAUZE/BANDAGES/DRESSINGS) ×2
DERMABOND ADVANCED .7 DNX12 (GAUZE/BANDAGES/DRESSINGS) ×1 IMPLANT
DRAPE C-ARM 42X120 X-RAY (DRAPES) IMPLANT
ELECT REM PT RETURN 9FT ADLT (ELECTROSURGICAL) ×3
ELECTRODE REM PT RTRN 9FT ADLT (ELECTROSURGICAL) ×1 IMPLANT
ENDOLOOP SUT PDS II  0 18 (SUTURE) ×4
ENDOLOOP SUT PDS II 0 18 (SUTURE) IMPLANT
GLOVE SURG SIGNA 7.5 PF LTX (GLOVE) ×3 IMPLANT
GOWN STRL REUS W/ TWL LRG LVL3 (GOWN DISPOSABLE) ×2 IMPLANT
GOWN STRL REUS W/ TWL XL LVL3 (GOWN DISPOSABLE) ×1 IMPLANT
GOWN STRL REUS W/TWL LRG LVL3 (GOWN DISPOSABLE) ×4
GOWN STRL REUS W/TWL XL LVL3 (GOWN DISPOSABLE) ×2
HEMOSTAT SNOW SURGICEL 2X4 (HEMOSTASIS) ×2 IMPLANT
KIT BASIN OR (CUSTOM PROCEDURE TRAY) ×3 IMPLANT
KIT TURNOVER KIT B (KITS) ×3 IMPLANT
NS IRRIG 1000ML POUR BTL (IV SOLUTION) ×3 IMPLANT
PAD ARMBOARD 7.5X6 YLW CONV (MISCELLANEOUS) ×3 IMPLANT
POUCH SPECIMEN RETRIEVAL 10MM (ENDOMECHANICALS) ×5 IMPLANT
SCISSORS LAP 5X35 DISP (ENDOMECHANICALS) ×3 IMPLANT
SET CHOLANGIOGRAPH 5 50 .035 (SET/KITS/TRAYS/PACK) IMPLANT
SET IRRIG TUBING LAPAROSCOPIC (IRRIGATION / IRRIGATOR) ×3 IMPLANT
SET TUBE SMOKE EVAC HIGH FLOW (TUBING) ×3 IMPLANT
SLEEVE ENDOPATH XCEL 5M (ENDOMECHANICALS) ×6 IMPLANT
SPECIMEN JAR SMALL (MISCELLANEOUS) ×3 IMPLANT
SUT MNCRL AB 4-0 PS2 18 (SUTURE) ×3 IMPLANT
TOWEL GREEN STERILE (TOWEL DISPOSABLE) ×3 IMPLANT
TOWEL GREEN STERILE FF (TOWEL DISPOSABLE) ×3 IMPLANT
TRAY LAPAROSCOPIC MC (CUSTOM PROCEDURE TRAY) ×3 IMPLANT
TROCAR XCEL BLUNT TIP 100MML (ENDOMECHANICALS) ×3 IMPLANT
TROCAR XCEL NON-BLD 5MMX100MML (ENDOMECHANICALS) ×3 IMPLANT
WATER STERILE IRR 1000ML POUR (IV SOLUTION) ×3 IMPLANT

## 2019-03-06 NOTE — Anesthesia Postprocedure Evaluation (Signed)
Anesthesia Post Note  Patient: John Livingston  Procedure(s) Performed: LAPAROSCOPIC CHOLECYSTECTOMY (N/A Abdomen)     Anesthesia Post Evaluation  Last Vitals:  Vitals:   03/06/19 1113 03/06/19 1130  BP: 134/61 (!) 134/58  Pulse: 66 66  Resp: 17 15  Temp:  36.5 C  SpO2: 96% 96%    Last Pain:  Vitals:   03/06/19 1113  PainSc: [place]

## 2019-03-06 NOTE — Anesthesia Preprocedure Evaluation (Signed)
Anesthesia Evaluation  Patient identified by MRN, date of birth, ID band Patient awake    Reviewed: Allergy & Precautions, NPO status , Patient's Chart, lab work & pertinent test results  Airway Mallampati: II  TM Distance: >3 FB     Dental   Pulmonary former smoker,    breath sounds clear to auscultation       Cardiovascular hypertension,  Rhythm:Regular Rate:Normal     Neuro/Psych    GI/Hepatic Neg liver ROS, GERD  ,  Endo/Other    Renal/GU Renal disease     Musculoskeletal   Abdominal   Peds  Hematology   Anesthesia Other Findings   Reproductive/Obstetrics                             Anesthesia Physical Anesthesia Plan  ASA: II  Anesthesia Plan: General   Post-op Pain Management:    Induction: Intravenous  PONV Risk Score and Plan: 2 and Ondansetron, Dexamethasone and Midazolam  Airway Management Planned: Oral ETT  Additional Equipment:   Intra-op Plan:   Post-operative Plan:   Informed Consent: I have reviewed the patients History and Physical, chart, labs and discussed the procedure including the risks, benefits and alternatives for the proposed anesthesia with the patient or authorized representative who has indicated his/her understanding and acceptance.     Dental advisory given  Plan Discussed with: CRNA and Anesthesiologist  Anesthesia Plan Comments:         Anesthesia Quick Evaluation

## 2019-03-06 NOTE — Anesthesia Procedure Notes (Signed)
Procedure Name: Intubation Date/Time: 03/06/2019 8:49 AM Performed by: Alain Marion, CRNA Pre-anesthesia Checklist: Patient identified, Emergency Drugs available, Suction available and Patient being monitored Patient Re-evaluated:Patient Re-evaluated prior to induction Oxygen Delivery Method: Circle System Utilized Preoxygenation: Pre-oxygenation with 100% oxygen Induction Type: IV induction Ventilation: Mask ventilation without difficulty Laryngoscope Size: Miller and 2 Grade View: Grade I Tube type: Oral Tube size: 7.5 mm Number of attempts: 1 Airway Equipment and Method: Stylet Placement Confirmation: ETT inserted through vocal cords under direct vision,  positive ETCO2 and breath sounds checked- equal and bilateral Secured at: 21 cm Tube secured with: Tape Dental Injury: Teeth and Oropharynx as per pre-operative assessment

## 2019-03-06 NOTE — Anesthesia Postprocedure Evaluation (Signed)
Anesthesia Post Note  Patient: John Livingston  Procedure(s) Performed: LAPAROSCOPIC CHOLECYSTECTOMY (N/A Abdomen)     Patient location during evaluation: PACU Anesthesia Type: General Level of consciousness: awake Pain management: pain level controlled Vital Signs Assessment: post-procedure vital signs reviewed and stable Respiratory status: spontaneous breathing Cardiovascular status: stable Postop Assessment: no apparent nausea or vomiting Anesthetic complications: no    Last Vitals:  Vitals:   03/06/19 1113 03/06/19 1130  BP: 134/61 (!) 134/58  Pulse: 66 66  Resp: 17 15  Temp:  36.5 C  SpO2: 96% 96%    Last Pain:  Vitals:   03/06/19 1113  PainSc: 8                  Kennesha Brewbaker

## 2019-03-06 NOTE — Interval H&P Note (Signed)
History and Physical Interval Note:no change in H and P  03/06/2019 7:10 AM  John Livingston  has presented today for surgery, with the diagnosis of CHOLECYSTITIS.  The various methods of treatment have been discussed with the patient and family. After consideration of risks, benefits and other options for treatment, the patient has consented to  Procedure(s): LAPAROSCOPIC CHOLECYSTECTOMY WITH POSSIBLE INTRAOPERATIVE CHOLANGIOGRAM (N/A) as a surgical intervention.  The patient's history has been reviewed, patient examined, no change in status, stable for surgery.  I have reviewed the patient's chart and labs.  Questions were answered to the patient's satisfaction.     Coralie Keens

## 2019-03-06 NOTE — Discharge Instructions (Signed)
[facility] ______[facility] LAPAROSCOPIC SURGERY: POST OP INSTRUCTIONS Always review your discharge instruction sheet given to you by the facility where your surgery was performed. IF YOU HAVE DISABILITY OR FAMILY LEAVE FORMS, YOU MUST BRING THEM TO THE OFFICE FOR PROCESSING.   DO NOT GIVE THEM TO YOUR DOCTOR.  1. A prescription for pain medication may be given to you upon discharge.  Take your pain medication as prescribed, if needed.  If narcotic pain medicine is not needed, then you may take acetaminophen (Tylenol) or ibuprofen (Advil) as needed. 2. Take your usually prescribed medications unless otherwise directed. 3. If you need a refill on your pain medication, please contact your pharmacy.  They will contact our office to request authorization. Prescriptions will not be filled after 5pm or on week-ends. 4. You should follow a light diet the first few days after arrival home, such as soup and crackers, etc.  Be sure to include lots of fluids daily. 5. Most patients will experience some swelling and bruising in the area of the incisions.  Ice packs will help.  Swelling and bruising can take several days to resolve.  6. It is common to experience some constipation if taking pain medication after surgery.  Increasing fluid intake and taking a stool softener (such as Colace) will usually help or prevent this problem from occurring.  A mild laxative (Milk of Magnesia or Miralax) should be taken according to package instructions if there are no bowel movements after 48 hours. 7. Unless discharge instructions indicate otherwise, you may remove your bandages 24-48 hours after surgery, and you may shower at that time.  You may have steri-strips (small skin tapes) in place directly over the incision.  These strips should be left on the skin for 7-10 days.  If your surgeon used skin glue on the incision, you may shower in 24 hours.  The glue will flake off over the next 2-3 weeks.  Any sutures or  staples will be removed at the office during your follow-up visit. 8. ACTIVITIES:  You may resume regular (light) daily activities beginning the next day--such as daily self-care, walking, climbing stairs--gradually increasing activities as tolerated.  You may have sexual intercourse when it is comfortable.  Refrain from any heavy lifting or straining until approved by your doctor. a. You may drive when you are no longer taking prescription pain medication, you can comfortably wear a seatbelt, and you can safely maneuver your car and apply brakes. b. RETURN TO WORK:  __________________________________________________________ 9. You should see your doctor in the office for a follow-up appointment approximately 2-3 weeks after your surgery.  Make sure that you call for this appointment within a day or two after you arrive home to insure a convenient appointment time. 10. OTHER INSTRUCTIONS:NO LIFTING MORE THAN 15 TO 20 POUNDS FOR 4 WEEKS 11. ICE PACK, TYLENOL, AND IBUPROFEN ALSO FOR PAIN __________________________________________________________________________________________________________________________ __________________________________________________________________________________________________________________________ WHEN TO CALL YOUR DOCTOR: 1. Fever over 101.0 2. Inability to urinate 3. Continued bleeding from incision. 4. Increased pain, redness, or drainage from the incision. 5. Increasing abdominal pain  The clinic staff is available to answer your questions during regular business hours.  Please dont hesitate to call and ask to speak to one of the nurses for clinical concerns.  If you have a medical emergency, go to the nearest emergency room or call 911.  A surgeon from [facility] is always on call at the hospital. [address], [place], [place], [place]  [address] ?  [id number], [place], [place]   [address] [phone] ? [phone] ? [phone]  [phone] Web site: [email or web address]

## 2019-03-06 NOTE — Transfer of Care (Signed)
Immediate Anesthesia Transfer of Care Note  Patient: John Livingston  Procedure(s) Performed: LAPAROSCOPIC CHOLECYSTECTOMY (N/A Abdomen)  Patient Location: PACU  Anesthesia Type:General  Level of Consciousness: drowsy and patient cooperative  Airway & Oxygen Therapy: Patient Spontanous Breathing  Post-op Assessment: Report given to RN and Post -op Vital signs reviewed and stable  Post vital signs: Reviewed and stable  Last Vitals:  Vitals Value Taken Time  BP 163/76   Temp    Pulse 76   Resp 18   SpO2 96     Last Pain:  Vitals:   03/06/19 0656  PainSc: 0-No pain      Patients Stated Pain Goal: 2 ([id number])  Complications: No apparent anesthesia complications

## 2019-03-06 NOTE — Op Note (Signed)
LAPAROSCOPIC CHOLECYSTECTOMY  Procedure Note  John Livingston 03/06/2019   Pre-op Diagnosis: CHOLECYSTITIS WITH CHOLELITHIASIS     Post-op Diagnosis: same  Procedure(s): LAPAROSCOPIC CHOLECYSTECTOMY  Surgeon(s): Coralie Keens, MD Michael Boston, MD  Anesthesia: General  Staff:  Circulator: Candi Leash, RN; Small, Benjaman Lobe, RN Scrub Person: Romero Liner, CST; Dollene Cleveland T; McMillian, Jahmesha S  Estimated Blood Loss: Minimal               Specimens: sent to path  Indications: This is an 78 year old gentleman who presented in October with cholecystitis and cholelithiasis.  At that time, he was not an operative candidate because of his comorbidities and condition so he had a percutaneous cholecystostomy tube placed by interventional radiology.  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  His percutaneous drain was in a good position in the gallbladder and was removed during the procedure.  Procedure: The patient was brought to the operating room and identifies correct patient.  He is placed upon the operating room table and general anesthesia was induced.  His abdomen was then prepped and draped in the usual sterile fashion including the right upper quadrant drain.  I made a vertical incision just below the umbilicus with a scalpel.  I carried this down to the fascia which was then opened with the scalpel.  Hemostat was then used to gain access into the peritoneal cavity under direct vision.  A 0 Vicryl pursing suture was placed around the fascial opening.  The [facility] port was placed through the opening and insufflation of the abdomen was begun.  The gallbladder was easily visualized and being pulled just slightly above the liver with the percutaneous cholecystostomy tube in place.  I placed a 5 mm trocar in the patient's epigastrium and 2 more  in the right upper quadrant under direct vision.  Dissection was then carried out the base the gallbladder.  Several adhesions were taken down.  I was then able to identify the cystic duct and cystic artery and achieved a critical window around both.  I clipped the artery proximally distally and transected it.  I then clipped the cystic duct several times proximally distally and transected as well.  I then placed 2 separate Endoloops on the cystic stump as well.  We then slowly dissected the gallbladder free from the liver bed with electrocautery.  Once it was free from the liver bed hemostasis appeared to be achieved with the cautery.  The gallbladder was then placed in an Endosac and removed through the incision at the umbilicus.  We placed a piece of surgical snow in the gallbladder fossa and liver bed as well.  Again, hemostasis appeared to be achieved.  The abdomen was irrigated with saline.  All ports were then removed under direct vision the abdomen was deflated.  The 0 Vicryl at the umbilicus was tied in place closing the fascial defect.  All incisions were then anesthetized Marcaine and closed with 4-0 Monocryl sutures.  Dermabond was then applied.  The patient tolerated the procedure well.  All the counts were correct at the end of the procedure.  The patient was then extubated in the operating room and taken in stable condition to the recovery room.          Coralie Keens   Date: 03/06/2019  Time: 9:34 AM

## 2019-03-10 LAB — SURGICAL PATHOLOGY

## 2019-03-31 ENCOUNTER — Ambulatory Visit (HOSPITAL_COMMUNITY)
Admission: RE | Admit: 2019-03-31 | Discharge: 2019-03-31 | Disposition: A | Discharge status: Home / Self Care | Payer: Medicare Other | Source: Ambulatory Visit | Attending: Nurse Practitioner | Admitting: Nurse Practitioner

## 2019-03-31 ENCOUNTER — Encounter (HOSPITAL_COMMUNITY): Payer: Self-pay | Admitting: Nurse Practitioner

## 2019-03-31 ENCOUNTER — Other Ambulatory Visit: Payer: Self-pay

## 2019-03-31 VITALS — BP 122/60 | HR 74 | Ht 69.0 in | Wt 186.4 lb

## 2019-03-31 DIAGNOSIS — R42 Dizziness and giddiness: Secondary | ICD-10-CM

## 2019-03-31 DIAGNOSIS — D6869 Other thrombophilia: Secondary | ICD-10-CM

## 2019-03-31 DIAGNOSIS — I10 Essential (primary) hypertension: Secondary | ICD-10-CM | POA: Diagnosis not present

## 2019-03-31 DIAGNOSIS — Z7901 Long term (current) use of anticoagulants: Secondary | ICD-10-CM | POA: Diagnosis not present

## 2019-03-31 DIAGNOSIS — J45909 Unspecified asthma, uncomplicated: Secondary | ICD-10-CM | POA: Insufficient documentation

## 2019-03-31 DIAGNOSIS — K219 Gastro-esophageal reflux disease without esophagitis: Secondary | ICD-10-CM | POA: Insufficient documentation

## 2019-03-31 DIAGNOSIS — I4891 Unspecified atrial fibrillation: Secondary | ICD-10-CM | POA: Diagnosis present

## 2019-03-31 DIAGNOSIS — Z87891 Personal history of nicotine dependence: Secondary | ICD-10-CM | POA: Diagnosis not present

## 2019-03-31 DIAGNOSIS — Z79899 Other long term (current) drug therapy: Secondary | ICD-10-CM | POA: Diagnosis not present

## 2019-03-31 DIAGNOSIS — K8020 Calculus of gallbladder without cholecystitis without obstruction: Secondary | ICD-10-CM | POA: Insufficient documentation

## 2019-03-31 LAB — COMPREHENSIVE METABOLIC PANEL
ALT: 14 U/L (ref 0–44)
AST: 17 U/L (ref 15–41)
Albumin: 4 g/dL (ref 3.5–5.0)
Alkaline Phosphatase: 52 U/L (ref 38–126)
Anion gap: 10 (ref 5–15)
BUN: 19 mg/dL (ref 8–23)
CO2: 23 mmol/L (ref 22–32)
Calcium: 9.3 mg/dL (ref 8.9–10.3)
Chloride: 106 mmol/L (ref 98–111)
Creatinine, Ser: 1.27 mg/dL — ABNORMAL HIGH (ref 0.61–1.24)
GFR calc Af Amer: >60 mL/min (ref >60)
GFR calc non Af Amer: 53 mL/min — ABNORMAL LOW (ref >60)
Glucose, Bld: 113 mg/dL — ABNORMAL HIGH (ref 70–99)
Potassium: 4.6 mmol/L (ref 3.5–5.1)
Sodium: 139 mmol/L (ref 135–145)
Total Bilirubin: 0.3 mg/dL (ref 0.3–1.2)
Total Protein: 6.9 g/dL (ref 6.5–8.1)

## 2019-03-31 LAB — CBC
HCT: 37.4 % — ABNORMAL LOW (ref 39.0–52.0)
Hemoglobin: 12.4 g/dL — ABNORMAL LOW (ref 13.0–17.0)
MCH: 30.9 pg (ref 26.0–34.0)
MCHC: 33.2 g/dL (ref 30.0–36.0)
MCV: 93.3 fL (ref 80.0–100.0)
Platelets: 212 10*3/uL (ref 150–400)
RBC: 4.01 MIL/uL — ABNORMAL LOW (ref 4.22–5.81)
RDW: 14.2 % (ref 11.5–15.5)
WBC: 4.4 10*3/uL (ref 4.0–10.5)
nRBC: 0 % (ref 0.0–0.2)

## 2019-03-31 NOTE — Progress Notes (Addendum)
Primary Care Physician: Puschinsky, Fransico Him., MD Referring Physician: F/u [facility] ER   John Livingston is a 79 y.o. male with a h/o prostrate CA, HYN.  GERD, asthma that has been in the ER x 2 late fall. The first time was with generalized abdominal pain and was found to gallstones and is pending appointment with a surgeon tomorrow. He then  developed extreme weakness and BP dropped per wife around 70 systolic. EMS was called and found to have afib with RVR. Because of the RVR and hypotension, he was cardioverted in the EMS  truck . He immediately felt better. He was started on apixaban and d/c home.   On f/u in  the clinic today, he was in [place]. He was uncomfortable with RUQ abdominal pain which his main concern today. He deniied alcohol, excessive caffeine, no tobacco or significant snoring. He is anxious to see surgeon tomorrow and get some relief.  F/u in afib clinic, 03/31/19 as pt asked to be seen this am for 14 days of dizziness. He describes this as sometimes with position change and other times as a sensation that will come over him. He is s/p gallbladder surgery 03/06/19. No other  symptoms. Has not noted blood in stool or urine. Is suppose to start treatment of prostate  CA next week. He is not orthostatic in the office. He was questioning  if this was afib. He is in SR today. No change in meds. No fever or chills. Continues  on eliquis 2.5 mg bid for a CHA2DS2VASc score of 2.  Today, he denies symptoms of palpitations, chest pain, shortness of breath, orthopnea, PND, lower extremity edema, +for dizziness, no  presyncope, syncope, or neurologic sequela.+ RUQ pain. The patient is tolerating medications without difficulties and is otherwise without complaint today.   Past Medical History:  Diagnosis Date  . Baker's cyst of knee    left  . Cancer of appendix ([place])   . GERD (gastroesophageal reflux disease)   . History of kidney stones   . Hypertension   . Moderate persistent asthma    pt.  denies  . Prostate cancer ([place])   . Ureteral stone    Past Surgical History:  Procedure Laterality Date  . APPENDECTOMY    . cartilage surgery in nose    . CHOLECYSTECTOMY N/A 03/06/2019   Procedure: LAPAROSCOPIC CHOLECYSTECTOMY;  Surgeon: Coralie Keens, MD;  Location: [facility];  Service: General;  Laterality: N/A;  . IR EXCHANGE BILIARY DRAIN  02/21/2019  . IR PERC CHOLECYSTOSTOMY  01/17/2019  . IR RADIOLOGIST EVAL & MGMT  02/19/2019    Current Outpatient Medications  Medication Sig Dispense Refill  . acetaminophen (TYLENOL) 650 MG CR tablet Take 1,300 mg by mouth every 8 (eight) hours as needed for pain.    Marland Kitchen albuterol (PROVENTIL HFA;VENTOLIN HFA) 108 (90 BASE) MCG/ACT inhaler Inhale 2 puffs into the lungs every 6 (six) hours as needed for wheezing or shortness of breath. 1 Inhaler 6  . amLODipine (NORVASC) 5 MG tablet Take 5 mg by mouth daily.     Marland Kitchen apixaban (ELIQUIS) 2.5 MG TABS tablet Take 1 tablet (2.5 mg total) by mouth 2 (two) times daily. 60 tablet 3  . brimonidine (ALPHAGAN) 0.2 % ophthalmic solution INSTILL 1 DROP INTO EACH EYE THREE TIMES DAILY    . Doxylamine Succinate, Sleep, (SLEEP AID PO) Take 2 tablets by mouth at bedtime.    Marland Kitchen omeprazole (PRILOSEC) 20 MG capsule Take 20 mg by mouth daily.    Marland Kitchen  tamsulosin (FLOMAX) 0.4 MG CAPS capsule Take 0.4 mg by mouth daily after supper.     . TRAVATAN Z 0.004 % SOLN ophthalmic solution Place 1 drop into both eyes 3 (three) times daily.      No current facility-administered medications for this encounter.    No Known Allergies  Social History   Socioeconomic History  . Marital status: Single    Spouse name: Not on file  . Number of children: Not on file  . Years of education: Not on file  . Highest education level: Not on file  Occupational History  . Occupation: IT trainer    Comment: [employer]  Tobacco Use  . Smoking status: Former Smoker    Packs/day: 1.00    Years: 20.00    Pack years: 20.00    Types:  Cigarettes    Quit date: 03/20/1978    Years since quitting: 41.0  . Smokeless tobacco: Never Used  Substance and Sexual Activity  . Alcohol use: Not Currently    Alcohol/week: 1.0 standard drinks    Types: 1 Cans of beer per week  . Drug use: No  . Sexual activity: Not on file  Other Topics Concern  . Not on file  Social History Narrative  . Not on file   Social Determinants of Health   Financial Resource Strain:   . Difficulty of Paying Living Expenses: Not on file  Food Insecurity:   . Worried About Charity fundraiser in the Last Year: Not on file  . Ran Out of Food in the Last Year: Not on file  Transportation Needs:   . Lack of Transportation (Medical): Not on file  . Lack of Transportation (Non-Medical): Not on file  Physical Activity:   . Days of Exercise per Week: Not on file  . Minutes of Exercise per Session: Not on file  Stress:   . Feeling of Stress : Not on file  Social Connections:   . Frequency of Communication with Friends and Family: Not on file  . Frequency of Social Gatherings with Friends and Family: Not on file  . Attends Religious Services: Not on file  . Active Member of Clubs or Organizations: Not on file  . Attends Archivist Meetings: Not on file  . Marital Status: Not on file  Intimate Partner Violence:   . Fear of Current or Ex-Partner: Not on file  . Emotionally Abused: Not on file  . Physically Abused: Not on file  . Sexually Abused: Not on file    Family History  Problem Relation Age of Onset  . Asthma Son        as a child  . Heart disease Mother   . Prostate cancer Father     ROS- All systems are reviewed and negative except as per the HPI above  Physical Exam: There were no vitals filed for this visit. Wt Readings from Last 3 Encounters:  03/06/19 83 kg  03/04/19 82.9 kg  02/18/19 82.1 kg    Labs: Lab Results  Component Value Date   NA 140 03/04/2019   K 4.5 03/04/2019   CL 105 03/04/2019   CO2 27  03/04/2019   GLUCOSE 118 (H) 03/04/2019   BUN 14 03/04/2019   CREATININE 1.00 03/04/2019   CALCIUM 9.5 03/04/2019   PHOS 3.6 01/15/2019   MG 2.1 01/15/2019   Lab Results  Component Value Date   INR 1.1 03/04/2019   No results found for: CHOL, HDL, LDLCALC,  TRIG   GEN- The patient is well appearing, alert and oriented x 3 today.   Head- normocephalic, atraumatic Eyes-  Sclera clear, conjunctiva pink Ears- hearing intact Oropharynx- clear Neck- supple, no JVP Lymph- no cervical lymphadenopathy Lungs- Clear to ausculation bilaterally, normal work of breathing Heart- Regular rate and rhythm, no murmurs, rubs or gallops, PMI not laterally displaced GI- soft, NT, ND, + BS Extremities- no clubbing, cyanosis, or edema MS- no significant deformity or atrophy Skin- no rash or lesion Psych- euthymic mood, full affect Neuro- strength and sensation are intact  EKG- NSR, normal EKG, PR int 176 ms, qrs int 96 ms, qtc 432 ms Epic records reviewed    Assessment and Plan: 1. New onset afib fall of 2020 that  may have been triggered by acute cholelithiasis  Has been staying in rhythm since DCCV 12/2019  2. Dizziness x 2 weeks In SR today  Not orthostatic so will not change BP meds States he has not lost a lot of weight  No change in health ? Etiology Will place a zio patch x 1 week in case intermittent afib Cbc/cmet today   3. Chadsvasc score of 2 Continue apixaban 2.5 mg daily  Will be in touch  when results come in  If my tests do not indicate a cause for dizziness then f/u with PCP   Butch Penny C. Kingsten [place], [facility] [address] [place], [place] [address] [phone]

## 2019-05-01 ENCOUNTER — Ambulatory Visit (INDEPENDENT_AMBULATORY_CARE_PROVIDER_SITE_OTHER): Payer: Medicare Other | Admitting: Cardiovascular Disease

## 2019-05-01 ENCOUNTER — Other Ambulatory Visit: Payer: Self-pay

## 2019-05-01 ENCOUNTER — Telehealth: Payer: Self-pay | Admitting: *Deleted

## 2019-05-01 ENCOUNTER — Encounter: Payer: Self-pay | Admitting: Cardiovascular Disease

## 2019-05-01 VITALS — BP 142/78 | HR 75 | Temp 97.0°F | Ht 69.0 in | Wt 183.6 lb

## 2019-05-01 DIAGNOSIS — I700 Atherosclerosis of aorta: Secondary | ICD-10-CM

## 2019-05-01 DIAGNOSIS — I480 Paroxysmal atrial fibrillation: Secondary | ICD-10-CM

## 2019-05-01 DIAGNOSIS — E785 Hyperlipidemia, unspecified: Secondary | ICD-10-CM

## 2019-05-01 DIAGNOSIS — Z7901 Long term (current) use of anticoagulants: Secondary | ICD-10-CM

## 2019-05-01 DIAGNOSIS — C61 Malignant neoplasm of prostate: Secondary | ICD-10-CM

## 2019-05-01 DIAGNOSIS — I10 Essential (primary) hypertension: Secondary | ICD-10-CM

## 2019-05-01 LAB — LIPID PANEL
Chol/HDL Ratio: 4.7 ratio (ref 0.0–5.0)
Cholesterol, Total: 175 mg/dL (ref 100–199)
HDL: 37 mg/dL — ABNORMAL LOW (ref >39)
LDL Chol Calc (NIH): 121 mg/dL — ABNORMAL HIGH (ref 0–99)
Triglycerides: 90 mg/dL (ref 0–149)
VLDL Cholesterol Cal: 17 mg/dL (ref 5–40)

## 2019-05-01 MED ORDER — METOPROLOL SUCCINATE ER 25 MG PO TB24: 25.0000 mg | ORAL_TABLET | Freq: Every day | ORAL | 3 refills | Status: DC

## 2019-05-01 NOTE — Progress Notes (Signed)
Cardiology Office Note:    Date:  05/01/2019   ID:  John Livingston, DOB [birth year], MRN [id number]  PCP:  Livingston, John Him., MD  Cardiologist:  John Livingston Electrophysiologist:  None   Referring MD: John Na.,*   Chief Complaint  Patient presents with  . Atrial Fibrillation     History of Present Illness:    John Livingston is a 79 y.o. male with recently diagnosed paroxysmal atrial fibrillation, incidentally discovered aortic atherosclerosis here for f/u.Marland Kitchen  Since his uncomplicated surgery, he has also had minimally symptomatic COVID-19 infection, 2 weeks since diagnosis. He is on anticoagulation, without bleeding complications.  He wore a 7 day monitor in January. Although he did not have atrial fibrillation, he had over 700 episodes of atrial tachycardia, maximum 36 seconds long. He did not have severe bradycardia or pauses.  The patient specifically denies any chest pain at rest exertion, dyspnea at rest or with exertion, orthopnea, paroxysmal nocturnal dyspnea, syncope, palpitations, focal neurological deficits, intermittent claudication, lower extremity edema, unexplained weight gain, cough, hemoptysis or wheezing.  He has been taking Eliquis without any bleeding complications and has no history of serious falls or injuries.  He is on the 2.5 mg twice daily dose because of his age and abnormal kidney function (creatinine 1.7 on 01/13/2019), but his last 5 lab panels show creatinine of 1.0-1.27.  His abdominal CT is described "advanced atherosclerotic calcification of the aorta".  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  Radiation therapy for prostate cancer has been on hold due to these other events, but he is due to restart it soon.  He has a history of GERD well controlled with a proton pump inhibitor.  He takes amlodipine for longstanding hypertension.  He does not have diabetes mellitus or previously  diagnosed hypercholesterolemia and does not smoke.  Past Medical History:  Diagnosis Date  . Baker's cyst of knee    left  . Cancer of appendix ([place])   . GERD (gastroesophageal reflux disease)   . History of kidney stones   . Hypertension   . Moderate persistent asthma    pt. denies  . Prostate cancer ([place])   . Ureteral stone     Past Surgical History:  Procedure Laterality Date  . APPENDECTOMY    . cartilage surgery in nose    . CHOLECYSTECTOMY N/A 03/06/2019   Procedure: LAPAROSCOPIC CHOLECYSTECTOMY;  Surgeon: John Keens, MD;  Location: [facility];  Service: General;  Laterality: N/A;  . IR EXCHANGE BILIARY DRAIN  02/21/2019  . IR PERC CHOLECYSTOSTOMY  01/17/2019  . IR RADIOLOGIST EVAL & MGMT  02/19/2019    Current Medications: Current Meds  Medication Sig  . acetaminophen (TYLENOL) 650 MG CR tablet Take 1,300 mg by mouth every 8 (eight) hours as needed for pain.  Marland Kitchen albuterol (PROVENTIL HFA;VENTOLIN HFA) 108 (90 BASE) MCG/ACT inhaler Inhale 2 puffs into the lungs every 6 (six) hours as needed for wheezing or shortness of breath.  . brimonidine (ALPHAGAN) 0.2 % ophthalmic solution INSTILL 1 DROP INTO EACH EYE THREE TIMES DAILY  . Doxylamine Succinate, Sleep, (SLEEP AID PO) Take 2 tablets by mouth at bedtime.  Marland Kitchen omeprazole (PRILOSEC) 20 MG capsule Take 20 mg by mouth daily.  . tamsulosin (FLOMAX) 0.4 MG CAPS capsule Take 0.4 mg by mouth daily after supper.   . traMADol (ULTRAM) 50 MG tablet TAKE 1 TABLET (50 MG TOTAL) BY MOUTH EVERY 6 (SIX) HOURS AS  NEEDED.  Marland Kitchen TRAVATAN Z 0.004 % SOLN ophthalmic solution Place 1 drop into both eyes 3 (three) times daily.   . [DISCONTINUED] amLODipine (NORVASC) 5 MG tablet Take 5 mg by mouth daily.      Allergies:   Patient has no known allergies.   Social History   Socioeconomic History  . Marital status: Single    Spouse name: Not on file  . Number of children: Not on file  . Years of education: Not on file  . Highest education  level: Not on file  Occupational History  . Occupation: IT trainer    Comment: [employer]  Tobacco Use  . Smoking status: Former Smoker    Packs/day: 1.00    Years: 20.00    Pack years: 20.00    Types: Cigarettes    Quit date: 03/20/1978    Years since quitting: 41.1  . Smokeless tobacco: Never Used  Substance and Sexual Activity  . Alcohol use: Not Currently    Alcohol/week: 1.0 standard drinks    Types: 1 Cans of beer per week  . Drug use: No  . Sexual activity: Not on file  Other Topics Concern  . Not on file  Social History Narrative  . Not on file   Social Determinants of Health   Financial Resource Strain:   . Difficulty of Paying Living Expenses: Not on file  Food Insecurity:   . Worried About Charity fundraiser in the Last Year: Not on file  . Ran Out of Food in the Last Year: Not on file  Transportation Needs:   . Lack of Transportation (Medical): Not on file  . Lack of Transportation (Non-Medical): Not on file  Physical Activity:   . Days of Exercise per Week: Not on file  . Minutes of Exercise per Session: Not on file  Stress:   . Feeling of Stress : Not on file  Social Connections:   . Frequency of Communication with Friends and Family: Not on file  . Frequency of Social Gatherings with Friends and Family: Not on file  . Attends Religious Services: Not on file  . Active Member of Clubs or Organizations: Not on file  . Attends Archivist Meetings: Not on file  . Marital Status: Not on file     Family History: The patient's family history includes Asthma in his son; Heart disease in his mother; Prostate cancer in his father.  ROS:   Please see the history of present illness.    All other systems are reviewed and are negative.  EKGs/Labs/Other Studies Reviewed:    The following studies were reviewed today: Extensive review of notes from multiple recent emergency room visits and hospitalization, CT scans of the abdomen, HIDA scan,  right upper quadrant ultrasound, surgical consultation notes  EKG:  EKG is not ordered today.  The ekg ordered 01/15/2019 today demonstrates normal sinus rhythm, normal tracing.  Her ECG tracings show similar findings.   EMS twelve-lead ECG from 01/13/2019 at 12: 16: 40 6 AM shows atrial fibrillation with rapid ventricular rate and diffuse nonspecific ST-T wave changes.  Recent Labs: 01/15/2019: Magnesium 2.1 03/31/2019: ALT 14; BUN 19; Creatinine, Ser 1.27; Hemoglobin 12.4; Platelets 212; Potassium 4.6; Sodium 139  Recent Lipid Panel No results found for: CHOL, TRIG, HDL, CHOLHDL, VLDL, LDLCALC, LDLDIRECT  Physical Exam:    VS:  BP (!) 142/78   Pulse 75   Temp (!) 97 F (36.1 C)   Ht 5\' 9"  (1.753 m)  Wt 183 lb 9.6 oz (83.3 kg)   SpO2 95%   BMI 27.11 kg/m     Wt Readings from Last 3 Encounters:  05/01/19 183 lb 9.6 oz (83.3 kg)  03/31/19 186 lb 6.4 oz (84.6 kg)  03/06/19 183 lb (83 kg)     General: Alert, oriented x3, no distress, appears fit and younger than stated age Head: no evidence of trauma, PERRL, EOMI, no exophtalmos or lid lag, no myxedema, no xanthelasma; normal ears, nose and oropharynx Neck: normal jugular venous pulsations and no hepatojugular reflux; brisk carotid pulses without delay and no carotid bruits Chest: clear to auscultation, no signs of consolidation by percussion or palpation, normal fremitus, symmetrical and full respiratory excursions Cardiovascular: normal position and quality of the apical impulse, regular rhythm, normal first and second heart sounds, no murmurs, rubs or gallops Abdomen: no tenderness or distention, no masses by palpation, no abnormal pulsatility or arterial bruits, normal bowel sounds, no hepatosplenomegaly Extremities: no clubbing, cyanosis or edema; 2+ radial, ulnar and brachial pulses bilaterally; 2+ right femoral, posterior tibial and dorsalis pedis pulses; 2+ left femoral, posterior tibial and dorsalis pedis pulses; no  subclavian or femoral bruits Neurological: grossly nonfocal Psych: Normal mood and affect   ASSESSMENT:    1. Paroxysmal atrial fibrillation (HCC)   2. Essential hypertension    PLAN:    In order of problems listed above:  1. AFib: He was never aware of the palpitations. Extremely frequent PAT is seen and I believe it's very likely he will have atrial fibrillation again.  Would continue anticoagulation, but does not need potent antiarrhythmics for what appears to be an otherwise infrequent and asymptomatic arrhythmia. Will start metoprolol; his baseline heart rate is not that slow.  2. Anticoagulation: The appropriate dose of Eliquis is 5 mg BID and will increase with his next prescription. 3. HTN: Stop amlodipine and start beta blocker. 4. Aortic atherosclerosis: This increases the likelihood that he has other sites of atherosclerosis including coronary disease, but as mentioned further evaluation is not indicated prior to his cholecystectomy since he is asymptomatic and has excellent functional status.  Check his lipid profile today. 5. Prostate cancer: to resume XRT after abdominal surgery.   Medication Adjustments/Labs and Tests Ordered: Current medicines are reviewed at length with the patient today.  Concerns regarding medicines are outlined above.  Orders Placed This Encounter  Procedures  . Lipid panel   Meds ordered this encounter  Medications  . metoprolol succinate (TOPROL-XL) 25 MG 24 hr tablet    Sig: Take 1 tablet (25 mg total) by mouth daily. Take with or immediately following a meal.    Dispense:  30 tablet    Refill:  3    Patient Instructions  Medication Instructions:  STOP the Amlodipine START Metoprolol Succinate 25 mg once daily  *If you need a refill on your cardiac medications before your next appointment, please call your pharmacy*  Lab Work: Your provider would like for you to have the following labs today: Lipid  If you have labs (blood work)  drawn today and your tests are completely normal, you will receive your results only by: Marland Kitchen MyChart Message (if you have MyChart) OR . A paper copy in the mail If you have any lab test that is abnormal or we need to change your treatment, we will call you to review the results.  Testing/Procedures: None ordered  Follow-Up: At [facility], you and your health needs are our priority.  As part of  our continuing mission to provide you with exceptional heart care, we have created designated Provider Care Teams.  These Care Teams include your primary Cardiologist (physician) and Advanced Practice Providers (APPs -  Physician Assistants and Nurse Practitioners) who all work together to provide you with the care you need, when you need it.  Your next appointment:   3 month(s)  The format for your next appointment:   In Person  Provider:   Sanda Klein, MD      Signed, Sanda Klein, MD  05/01/2019 12:30 PM    [place]

## 2019-05-01 NOTE — Telephone Encounter (Signed)
Attempted to reach the patient. According to Dr. Sallyanne Kuster, the patient should increase his Eliquis to 5 mg bid.  The voicemail was full. Will call back.

## 2019-05-01 NOTE — Patient Instructions (Signed)
Medication Instructions:  STOP the Amlodipine START Metoprolol Succinate 25 mg once daily  *If you need a refill on your cardiac medications before your next appointment, please call your pharmacy*  Lab Work: Your provider would like for you to have the following labs today: Lipid  If you have labs (blood work) drawn today and your tests are completely normal, you will receive your results only by: Marland Kitchen MyChart Message (if you have MyChart) OR . A paper copy in the mail If you have any lab test that is abnormal or we need to change your treatment, we will call you to review the results.  Testing/Procedures: None ordered  Follow-Up: At [facility], you and your health needs are our priority.  As part of our continuing mission to provide you with exceptional heart care, we have created designated Provider Care Teams.  These Care Teams include your primary Cardiologist (physician) and Advanced Practice Providers (APPs -  Physician Assistants and Nurse Practitioners) who all work together to provide you with the care you need, when you need it.  Your next appointment:   3 month(s)  The format for your next appointment:   In Person  Provider:   Sanda Klein, MD

## 2019-05-02 NOTE — Telephone Encounter (Signed)
Attempted to reach the patient. Voicemail was full.   LDL-that BP is perfectly normal especially for someone who is not feeling well. For someone diets not for someone that is not feeling well, assuming rhythm is normal on apple watch? Of course, that is not your EKG, just the rhythm strip, of course but very normal. But LDL cholesterol is too high, considering how much plaque we can see in his aorta. I would recommend starting atorvastatin 10 mg once daily and rechecking a lipid profile in 3 months.

## 2019-05-07 MED ORDER — APIXABAN 5 MG PO TABS: 5.0000 mg | ORAL_TABLET | Freq: Two times a day (BID) | ORAL | 3 refills | Status: DC

## 2019-05-07 MED ORDER — ATORVASTATIN CALCIUM 10 MG PO TABS: 10.0000 mg | ORAL_TABLET | Freq: Every day | ORAL | 3 refills | Status: DC

## 2019-05-07 NOTE — Telephone Encounter (Signed)
Left a message for the patient to call back on the wife's voicemail (per DPR).

## 2019-05-07 NOTE — Telephone Encounter (Signed)
Follow up   Patient's wife is calling about results per the previous message. Please call.

## 2019-05-07 NOTE — Telephone Encounter (Signed)
The patient's wife, per the dpr, has been made aware of the changes and results.  Increase the Eliquis to 5 mg bid Start Atorvastatin 10 mg once daily Lipids in 3 months.

## 2019-06-07 ENCOUNTER — Other Ambulatory Visit: Payer: Self-pay | Admitting: Cardiovascular Disease

## 2019-08-26 ENCOUNTER — Ambulatory Visit: Payer: Medicare Other | Admitting: Cardiovascular Disease

## 2019-08-27 ENCOUNTER — Ambulatory Visit: Payer: Medicare Other | Admitting: Cardiovascular Disease

## 2019-08-31 ENCOUNTER — Other Ambulatory Visit: Payer: Self-pay | Admitting: Cardiovascular Disease

## 2019-10-24 ENCOUNTER — Ambulatory Visit (INDEPENDENT_AMBULATORY_CARE_PROVIDER_SITE_OTHER): Payer: Medicare Other | Admitting: Cardiovascular Disease

## 2019-10-24 ENCOUNTER — Other Ambulatory Visit: Payer: Self-pay

## 2019-10-24 ENCOUNTER — Encounter: Payer: Self-pay | Admitting: Cardiovascular Disease

## 2019-10-24 VITALS — BP 116/63 | HR 59 | Ht 69.5 in | Wt 195.8 lb

## 2019-10-24 DIAGNOSIS — I700 Atherosclerosis of aorta: Secondary | ICD-10-CM | POA: Diagnosis not present

## 2019-10-24 DIAGNOSIS — Z7901 Long term (current) use of anticoagulants: Secondary | ICD-10-CM

## 2019-10-24 DIAGNOSIS — C61 Malignant neoplasm of prostate: Secondary | ICD-10-CM

## 2019-10-24 DIAGNOSIS — I480 Paroxysmal atrial fibrillation: Secondary | ICD-10-CM

## 2019-10-24 DIAGNOSIS — I10 Essential (primary) hypertension: Secondary | ICD-10-CM

## 2019-10-24 DIAGNOSIS — N2889 Other specified disorders of kidney and ureter: Secondary | ICD-10-CM

## 2019-10-24 NOTE — Patient Instructions (Signed)

## 2019-10-24 NOTE — Addendum Note (Signed)
Addended by: Wonda Horner on: 10/24/2019 08:54 AM   Modules accepted: Orders

## 2019-10-24 NOTE — Progress Notes (Signed)
Cardiology Office Note:    Date:  10/24/2019   ID:  [name], DOB 06-24-1940, MRN [id number]  PCP:  Puschinsky, Fransico Him., MD  Cardiologist:  Jamieon Lannen Electrophysiologist:  None   Referring MD: Milana Na.,*   Chief Complaint  Patient presents with  . Atrial Fibrillation     History of Present Illness:    John Livingston is a 79 y.o. male with asymptomatic paroxysmal atrial fibrillation, incidentally discovered aortic atherosclerosis here for f/u.  The patient specifically denies any chest pain at rest exertion, dyspnea at rest or with exertion, orthopnea, paroxysmal nocturnal dyspnea, syncope, palpitations, focal neurological deficits, intermittent claudication, lower extremity edema, unexplained weight gain, cough, hemoptysis or wheezing.  Denies any falls, injuries or bleeding.  He remains very physically active.  His abdominal CT is described "advanced atherosclerotic calcification of the aorta".  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  Additional problems include prostate cancer treated with radiation therapy, GERD on PPI, longstanding hypertension, mild hypercholesterolemia.Marland Kitchen  He does not have known CAD, PAD, diabetes mellitus and does not smoke.  Past Medical History:  Diagnosis Date  . Baker's cyst of knee    left  . Cancer of appendix ([place])   . GERD (gastroesophageal reflux disease)   . History of kidney stones   . Hypertension   . Moderate persistent asthma    pt. denies  . Prostate cancer ([place])   . Ureteral stone     Past Surgical History:  Procedure Laterality Date  . APPENDECTOMY    . cartilage surgery in nose    . CHOLECYSTECTOMY N/A 03/06/2019   Procedure: LAPAROSCOPIC CHOLECYSTECTOMY;  Surgeon: Coralie Keens, MD;  Location: [facility];  Service: General;  Laterality: N/A;  . IR EXCHANGE BILIARY DRAIN  02/21/2019  . IR PERC CHOLECYSTOSTOMY  01/17/2019  . IR RADIOLOGIST EVAL & MGMT   02/19/2019    Current Medications: Current Meds  Medication Sig  . acetaminophen (TYLENOL) 650 MG CR tablet Take 1,300 mg by mouth every 8 (eight) hours as needed for pain.  Marland Kitchen albuterol (PROVENTIL HFA;VENTOLIN HFA) 108 (90 BASE) MCG/ACT inhaler Inhale 2 puffs into the lungs every 6 (six) hours as needed for wheezing or shortness of breath.  Marland Kitchen atorvastatin (LIPITOR) 10 MG tablet TAKE 1 TABLET BY MOUTH EVERY DAY  . brimonidine (ALPHAGAN) 0.2 % ophthalmic solution INSTILL 1 DROP INTO EACH EYE THREE TIMES DAILY  . Doxylamine Succinate, Sleep, (SLEEP AID PO) Take 2 tablets by mouth at bedtime.  Marland Kitchen ELIQUIS 5 MG TABS tablet TAKE 1 TABLET BY MOUTH TWICE A DAY  . omeprazole (PRILOSEC) 20 MG capsule Take 20 mg by mouth daily.  . tamsulosin (FLOMAX) 0.4 MG CAPS capsule Take 0.4 mg by mouth daily after supper.   . traMADol (ULTRAM) 50 MG tablet TAKE 1 TABLET (50 MG TOTAL) BY MOUTH EVERY 6 (SIX) HOURS AS NEEDED.  Marland Kitchen TRAVATAN Z 0.004 % SOLN ophthalmic solution Place 1 drop into both eyes 3 (three) times daily.      Allergies:   Patient has no known allergies.   Social History   Socioeconomic History  . Marital status: Single    Spouse name: Not on file  . Number of children: Not on file  . Years of education: Not on file  . Highest education level: Not on file  Occupational History  . Occupation: IT trainer    Comment: [employer]  Tobacco Use  .  Smoking status: Former Smoker    Packs/day: 1.00    Years: 20.00    Pack years: 20.00    Types: Cigarettes    Quit date: 03/20/1978    Years since quitting: 41.6  . Smokeless tobacco: Never Used  Vaping Use  . Vaping Use: Never used  Substance and Sexual Activity  . Alcohol use: Not Currently    Alcohol/week: 1.0 standard drink    Types: 1 Cans of beer per week  . Drug use: No  . Sexual activity: Not on file  Other Topics Concern  . Not on file  Social History Narrative  . Not on file   Social Determinants of Health   Financial  Resource Strain:   . Difficulty of Paying Living Expenses:   Food Insecurity:   . Worried About Charity fundraiser in the Last Year:   . Arboriculturist in the Last Year:   Transportation Needs:   . Film/video editor (Medical):   Marland Kitchen Lack of Transportation (Non-Medical):   Physical Activity:   . Days of Exercise per Week:   . Minutes of Exercise per Session:   Stress:   . Feeling of Stress :   Social Connections:   . Frequency of Communication with Friends and Family:   . Frequency of Social Gatherings with Friends and Family:   . Attends Religious Services:   . Active Member of Clubs or Organizations:   . Attends Archivist Meetings:   Marland Kitchen Marital Status:      Family History: The patient's family history includes Asthma in his son; Heart disease in his mother; Prostate cancer in his father.  ROS:   Please see the history of present illness.    All other systems are reviewed and are negative.   EKGs/Labs/Other Studies Reviewed:    The following studies were reviewed today: n/a EKG:  EKG is ordered today shows sinus rhythm with PACs are present of bigeminy, otherwise normal tracing  EMS twelve-lead ECG from 01/13/2019 at 12: 16: 46 AM shows atrial fibrillation with rapid ventricular rate and diffuse nonspecific ST-T wave changes.  Recent Labs: 01/15/2019: Magnesium 2.1 03/31/2019: ALT 14; BUN 19; Creatinine, Ser 1.27; Hemoglobin 12.4; Platelets 212; Potassium 4.6; Sodium 139  Recent Lipid Panel    Component Value Date/Time   CHOL 175 05/01/2019 0846   TRIG 90 05/01/2019 0846   HDL 37 (L) 05/01/2019 0846   CHOLHDL 4.7 05/01/2019 0846   LDLCALC 121 (H) 05/01/2019 0846    Physical Exam:    VS:  BP 116/63   Pulse (!) 59   Ht 5' 9.5" (1.765 m)   Wt 195 lb 12.8 oz (88.8 kg)   SpO2 98%   BMI 28.50 kg/m     Wt Readings from Last 3 Encounters:  10/24/19 195 lb 12.8 oz (88.8 kg)  05/01/19 183 lb 9.6 oz (83.3 kg)  03/31/19 186 lb 6.4 oz (84.6 kg)       General: Alert, oriented x3, no distress, appears fit and younger than stated age Head: no evidence of trauma, PERRL, EOMI, no exophtalmos or lid lag, no myxedema, no xanthelasma; normal ears, nose and oropharynx Neck: normal jugular venous pulsations and no hepatojugular reflux; brisk carotid pulses without delay and no carotid bruits Chest: clear to auscultation, no signs of consolidation by percussion or palpation, normal fremitus, symmetrical and full respiratory excursions Cardiovascular: normal position and quality of the apical impulse,  bigeminal rhythm, normal first and second heart sounds,  no murmurs, rubs or gallops Abdomen: no tenderness or distention, no masses by palpation, no abnormal pulsatility or arterial bruits, normal bowel sounds, no hepatosplenomegaly Extremities: no clubbing, cyanosis or edema; 2+ radial, ulnar and brachial pulses bilaterally; 2+ right femoral, posterior tibial and dorsalis pedis pulses; 2+ left femoral, posterior tibial and dorsalis pedis pulses; no subclavian or femoral bruits Neurological: grossly nonfocal Psych: Normal mood and affect   ASSESSMENT:    1. AF (paroxysmal atrial fibrillation) ([place])   2. Long term current use of anticoagulant   3. Essential hypertension   4. Aortic atherosclerosis ([place])   5. Prostate cancer ([place])   6. Right kidney mass    PLAN:    In order of problems listed above:  1. AFib: Has always been unaware of palpitations.  Follow-up monitor showed extremely frequent episodes of paroxysmal atrial tachycardia.  Tolerating metoprolol well which is also controlling his blood pressure.  CHA2DS2-VASc 4 (age 12, hypertension, aortic atherosclerosis).  2. Anticoagulation: Well-tolerated without bleeding problems.  Most recent creatinine 1.27. 3. HTN: controlled on beta blocker. 4. Aortic atherosclerosis: on statin. No symptoms of PAD or CAD. On statin. Plans to have follow up labs with PCP. 5. Prostate cancer: s/p  XRT. 6. 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  Concerns regarding medicines are outlined above.  No orders of the defined types were placed in this encounter.  No orders of the defined types were placed in this encounter.   Patient Instructions  Medication Instructions:  No changes *If you need a refill on your cardiac medications before your next appointment, please call your pharmacy*   Lab Work: None ordered If you have labs (blood work) drawn today and your tests are completely normal, you will receive your results only by: Marland Kitchen MyChart Message (if you have MyChart) OR . A paper copy in the mail If you have any lab test that is abnormal or we need to change your treatment, we will call you to review the results.   Testing/Procedures: None ordered   Follow-Up: At [facility], you and your health needs are our priority.  As part of our continuing mission to provide you with exceptional heart care, we have created designated Provider Care Teams.  These Care Teams include your primary Cardiologist (physician) and Advanced Practice Providers (APPs -  Physician Assistants and Nurse Practitioners) who all work together to provide you with the care you need, when you need it.  We recommend signing up for the patient portal called "MyChart".  Sign up information is provided on this After Visit Summary.  MyChart is used to connect with patients for Virtual Visits (Telemedicine).  Patients are able to view lab/test results, encounter notes, upcoming appointments, etc.  Non-urgent messages can be sent to your provider as well.   To learn more about what you can do with MyChart, go to [email or web address].    Your next appointment:   12 month(s)  The format for your next appointment:   In Person  Provider:   You may see Sanda Klein, MD or one of the  following Advanced Practice Providers on your designated Care Team:    Almyra Deforest, PA-C  Fabian Sharp, [place] or   Roby Lofts, PA-C       Signed, Sanda Klein, MD  10/24/2019 8:33 AM    [place]

## 2019-10-29 ENCOUNTER — Other Ambulatory Visit: Payer: Self-pay | Admitting: *Deleted

## 2019-10-29 ENCOUNTER — Encounter: Payer: Self-pay | Admitting: *Deleted

## 2019-10-29 DIAGNOSIS — E785 Hyperlipidemia, unspecified: Secondary | ICD-10-CM

## 2020-02-19 ENCOUNTER — Other Ambulatory Visit: Payer: Self-pay | Admitting: Cardiovascular Disease

## 2020-07-01 ENCOUNTER — Other Ambulatory Visit: Payer: Self-pay | Admitting: Cardiovascular Disease

## 2020-07-02 ENCOUNTER — Other Ambulatory Visit: Payer: Self-pay | Admitting: Cardiovascular Disease

## 2020-07-14 ENCOUNTER — Telehealth: Payer: Self-pay | Admitting: Cardiovascular Disease

## 2020-07-14 NOTE — Telephone Encounter (Signed)
Wife state she wanted to make Dr. Loletha Grayer aware regarding the below message. She state pt is currently feeling overall well, but wanted to schedule an appointment.   Appointment scheduled for 5/12 with APP. Wife made aware of ED precautions should any new symptoms develop or worsen.     Patient's wife states the patient had to call EMS while in [place] 07/11/2020. She states he woke up at 2 am with a heaviness in his chest. She states the EMS said his HR was going from 100 to 170 back to 100. She states due to the fluctuation he was told if he did no go to the ED he may not live long. She states they did blood work and gave him a shot to get his heart in rhythm. She states she has all the paper work from his visit to the ED. She states 2 years ago when he was on the way to the hospital for his gallbladder surgery he had afib.

## 2020-07-14 NOTE — Telephone Encounter (Signed)
Patient c/o Palpitations:  High priority if patient c/o lightheadedness, shortness of breath, or chest pain  1) How long have you had palpitations/irregular HR/ Afib? Are you having the symptoms now? 4/24 woke up 2am, no  2) Are you currently experiencing lightheadedness, SOB or CP? no  3) Do you have a history of afib (atrial fibrillation) or irregular heart rhythm? 2 years on way to hospital for gallbadder surgery  4) Have you checked your BP or HR? (document readings if available): 100  5) Are you experiencing any other symptoms? no   Patient's wife states the patient had to call EMS while in [place] 07/11/2020. She states he woke up at 2 am with a heaviness in his chest. She states the EMS said his HR was going from 100 to 170 back to 100. She states due to the fluctuation he was told if he did no go to the ED he may not live long. She states they did blood work and gave him a shot to get his heart in rhythm. She states she has all the paper work from his visit to the ED. She states 2 years ago when he was on the way to the hospital for his gallbladder surgery he had afib.

## 2020-07-18 ENCOUNTER — Encounter (HOSPITAL_BASED_OUTPATIENT_CLINIC_OR_DEPARTMENT_OTHER): Payer: Self-pay

## 2020-07-18 ENCOUNTER — Other Ambulatory Visit: Payer: Self-pay

## 2020-07-18 ENCOUNTER — Emergency Department (HOSPITAL_BASED_OUTPATIENT_CLINIC_OR_DEPARTMENT_OTHER): Payer: Medicare Other

## 2020-07-18 ENCOUNTER — Emergency Department (HOSPITAL_BASED_OUTPATIENT_CLINIC_OR_DEPARTMENT_OTHER)
Admission: EM | Admit: 2020-07-18 | Discharge: 2020-07-18 | Disposition: A | Discharge status: Home / Self Care | Payer: Medicare Other | Attending: Emergency Medicine | Admitting: Emergency Medicine

## 2020-07-18 DIAGNOSIS — J45909 Unspecified asthma, uncomplicated: Secondary | ICD-10-CM | POA: Diagnosis not present

## 2020-07-18 DIAGNOSIS — I10 Essential (primary) hypertension: Secondary | ICD-10-CM | POA: Diagnosis not present

## 2020-07-18 DIAGNOSIS — Z8546 Personal history of malignant neoplasm of prostate: Secondary | ICD-10-CM | POA: Diagnosis not present

## 2020-07-18 DIAGNOSIS — J101 Influenza due to other identified influenza virus with other respiratory manifestations: Secondary | ICD-10-CM | POA: Diagnosis not present

## 2020-07-18 DIAGNOSIS — Z79899 Other long term (current) drug therapy: Secondary | ICD-10-CM | POA: Diagnosis not present

## 2020-07-18 DIAGNOSIS — J111 Influenza due to unidentified influenza virus with other respiratory manifestations: Secondary | ICD-10-CM

## 2020-07-18 DIAGNOSIS — Z7901 Long term (current) use of anticoagulants: Secondary | ICD-10-CM | POA: Insufficient documentation

## 2020-07-18 DIAGNOSIS — R059 Cough, unspecified: Secondary | ICD-10-CM | POA: Diagnosis present

## 2020-07-18 DIAGNOSIS — Z87891 Personal history of nicotine dependence: Secondary | ICD-10-CM | POA: Insufficient documentation

## 2020-07-18 DIAGNOSIS — R0602 Shortness of breath: Secondary | ICD-10-CM | POA: Diagnosis not present

## 2020-07-18 DIAGNOSIS — Z85038 Personal history of other malignant neoplasm of large intestine: Secondary | ICD-10-CM | POA: Diagnosis not present

## 2020-07-18 DIAGNOSIS — Z20822 Contact with and (suspected) exposure to covid-19: Secondary | ICD-10-CM | POA: Insufficient documentation

## 2020-07-18 LAB — CBC WITH DIFFERENTIAL/PLATELET
Abs Immature Granulocytes: 0.02 10*3/uL (ref 0.00–0.07)
Basophils Absolute: 0 10*3/uL (ref 0.0–0.1)
Basophils Relative: 1 %
Eosinophils Absolute: 0.1 10*3/uL (ref 0.0–0.5)
Eosinophils Relative: 2 %
HCT: 35.3 % — ABNORMAL LOW (ref 39.0–52.0)
Hemoglobin: 12.3 g/dL — ABNORMAL LOW (ref 13.0–17.0)
Immature Granulocytes: 0 %
Lymphocytes Relative: 10 %
Lymphs Abs: 0.5 10*3/uL — ABNORMAL LOW (ref 0.7–4.0)
MCH: 31.4 pg (ref 26.0–34.0)
MCHC: 34.8 g/dL (ref 30.0–36.0)
MCV: 90.1 fL (ref 80.0–100.0)
Monocytes Absolute: 0.9 10*3/uL (ref 0.1–1.0)
Monocytes Relative: 19 %
Neutro Abs: 3.2 10*3/uL (ref 1.7–7.7)
Neutrophils Relative %: 68 %
Platelets: 136 10*3/uL — ABNORMAL LOW (ref 150–400)
RBC: 3.92 MIL/uL — ABNORMAL LOW (ref 4.22–5.81)
RDW: 13.6 % (ref 11.5–15.5)
WBC: 4.7 10*3/uL (ref 4.0–10.5)
nRBC: 0 % (ref 0.0–0.2)

## 2020-07-18 LAB — COMPREHENSIVE METABOLIC PANEL
ALT: 17 U/L (ref 0–44)
AST: 22 U/L (ref 15–41)
Albumin: 4 g/dL (ref 3.5–5.0)
Alkaline Phosphatase: 57 U/L (ref 38–126)
Anion gap: 10 (ref 5–15)
BUN: 16 mg/dL (ref 8–23)
CO2: 27 mmol/L (ref 22–32)
Calcium: 9 mg/dL (ref 8.9–10.3)
Chloride: 100 mmol/L (ref 98–111)
Creatinine, Ser: 1.31 mg/dL — ABNORMAL HIGH (ref 0.61–1.24)
GFR, Estimated: 55 mL/min — ABNORMAL LOW (ref >60)
Glucose, Bld: 117 mg/dL — ABNORMAL HIGH (ref 70–99)
Potassium: 3.7 mmol/L (ref 3.5–5.1)
Sodium: 137 mmol/L (ref 135–145)
Total Bilirubin: 0.6 mg/dL (ref 0.3–1.2)
Total Protein: 7.1 g/dL (ref 6.5–8.1)

## 2020-07-18 LAB — RESP PANEL BY RT-PCR (FLU A&B, COVID) ARPGX2
Influenza A by PCR: POSITIVE — AB
Influenza B by PCR: NEGATIVE
SARS Coronavirus 2 by RT PCR: NEGATIVE

## 2020-07-18 LAB — BRAIN NATRIURETIC PEPTIDE: B Natriuretic Peptide: 88 pg/mL (ref 0.0–100.0)

## 2020-07-18 MED ORDER — SODIUM CHLORIDE 0.9 % IV BOLUS
500.0000 mL | Freq: Once | INTRAVENOUS | Status: AC
Start: 2020-07-18 — End: 2020-07-18
  Administered 2020-07-18: 500 mL via INTRAVENOUS

## 2020-07-18 MED ORDER — OSELTAMIVIR PHOSPHATE 30 MG PO CAPS: 30.0000 mg | ORAL_CAPSULE | Freq: Two times a day (BID) | ORAL | 0 refills | Status: DC

## 2020-07-18 MED ORDER — ACETAMINOPHEN 500 MG PO TABS
1000.0000 mg | ORAL_TABLET | Freq: Once | ORAL | Status: AC
  Administered 2020-07-18: 1000 mg via ORAL
  Filled 2020-07-18: qty 2

## 2020-07-18 NOTE — ED Notes (Signed)
Ambulated in room, SpO2 >94% on room air, HR 114 max, denies DOE.  STRONG NP cough.

## 2020-07-18 NOTE — ED Triage Notes (Signed)
Productive cough with associated HA x 1 day.  Seen recently out of state for CP and Afib.  Pt denies any SOB/CP on arrival but is noticeably DOE.

## 2020-07-18 NOTE — ED Provider Notes (Signed)
[facility] EMERGENCY DEPARTMENT Provider Note   CSN: [id number] Arrival date & time: 07/18/20  0830     History Chief Complaint  Patient presents with  . Cough    John Livingston is a 80 y.o. male.  Patient is a 80 year old male with a history of hypertension, paroxysmal atrial fibrillation and GERD who presents with cough.  He states he started having a cough 2 days ago.  Its productive of some sputum intermittently.  He denies any known fevers although he had a temperature of 99.4 in the ED.  He denies any shortness of breath although when he was walking to the ED, he appeared to be visibly short of breath.  He denies any associated chest pain.  No leg swelling.  No vomiting or diarrhea.  He does have an intermittent right-sided headache associated with the coughing although he denies any current headache.        Past Medical History:  Diagnosis Date  . Baker's cyst of knee    left  . Cancer of appendix ([place])   . GERD (gastroesophageal reflux disease)   . History of kidney stones   . Hypertension   . Moderate persistent asthma    pt. denies  . Prostate cancer ([place])   . Ureteral stone     Patient Active Problem List   Diagnosis Date Noted  . Acute cholecystitis 01/15/2019  . AF (paroxysmal atrial fibrillation) ([place]) 01/15/2019  . Prostate cancer ([place]) 01/15/2019  . Mild intermittent asthma 01/15/2019  . Hypertension 01/15/2019  . AKI (acute kidney injury) ([place]) 01/15/2019  . Hypokalemia 01/15/2019  . Preoperative cardiovascular examination   . Moderate persistent asthma with reflux disease 04/18/2012  . Dysphagia 04/18/2012  . GERD (gastroesophageal reflux disease) 04/18/2012    Past Surgical History:  Procedure Laterality Date  . APPENDECTOMY    . cartilage surgery in nose    . CHOLECYSTECTOMY N/A 03/06/2019   Procedure: LAPAROSCOPIC CHOLECYSTECTOMY;  Surgeon: Coralie Keens, MD;  Location: [facility];  Service: General;  Laterality: N/A;  . IR  EXCHANGE BILIARY DRAIN  02/21/2019  . IR PERC CHOLECYSTOSTOMY  01/17/2019  . IR RADIOLOGIST EVAL & MGMT  02/19/2019       Family History  Problem Relation Age of Onset  . Asthma Son        as a child  . Heart disease Mother   . Prostate cancer Father     Social History   Tobacco Use  . Smoking status: Former Smoker    Packs/day: 1.00    Years: 20.00    Pack years: 20.00    Types: Cigarettes    Quit date: 03/20/1978    Years since quitting: 42.3  . Smokeless tobacco: Never Used  Vaping Use  . Vaping Use: Never used  Substance Use Topics  . Alcohol use: Not Currently    Alcohol/week: 1.0 standard drink    Types: 1 Cans of beer per week  . Drug use: No    Home Medications Prior to Admission medications   Medication Sig Start Date End Date Taking? Authorizing Provider  acetaminophen (TYLENOL) 650 MG CR tablet Take 1,300 mg by mouth every 8 (eight) hours as needed for pain.    [provider]  albuterol (PROVENTIL HFA;VENTOLIN HFA) 108 (90 BASE) MCG/ACT inhaler Inhale 2 puffs into the lungs every 6 (six) hours as needed for wheezing or shortness of breath. 04/18/12   Elsie Stain, MD  atorvastatin (LIPITOR) 10 MG tablet TAKE 1  TABLET BY MOUTH EVERY DAY 07/02/20   Croitoru, Dani Gobble, MD  brimonidine (ALPHAGAN) 0.2 % ophthalmic solution INSTILL 1 DROP INTO EACH EYE THREE TIMES DAILY 02/28/19   [provider]  Doxylamine Succinate, Sleep, (SLEEP AID PO) Take 2 tablets by mouth at bedtime.    [provider]  ELIQUIS 5 MG TABS tablet TAKE 1 TABLET BY MOUTH TWICE A DAY 02/19/20   Croitoru, Mihai, MD  metoprolol succinate (TOPROL-XL) 25 MG 24 hr tablet TAKE 1 TABLET (25 MG TOTAL) BY MOUTH DAILY. TAKE WITH OR IMMEDIATELY FOLLOWING A MEAL. 07/01/20 09/29/20  Croitoru, Mihai, MD  omeprazole (PRILOSEC) 20 MG capsule Take 20 mg by mouth daily.    [provider]  tamsulosin (FLOMAX) 0.4 MG CAPS capsule Take 0.4 mg by mouth daily after supper.     [provider]  traMADol (ULTRAM) 50 MG tablet TAKE 1 TABLET (50 MG TOTAL) BY MOUTH EVERY 6 (SIX) HOURS AS NEEDED. 03/06/19   [provider]  TRAVATAN Z 0.004 % SOLN ophthalmic solution Place 1 drop into both eyes 3 (three) times daily.     [provider]    Allergies    Patient has no known allergies.  Review of Systems   Review of Systems  Constitutional: Positive for fatigue and fever. Negative for chills and diaphoresis.  HENT: Negative for congestion, rhinorrhea and sneezing.   Eyes: Negative.   Respiratory: Positive for cough. Negative for chest tightness and shortness of breath.   Cardiovascular: Negative for chest pain and leg swelling.  Gastrointestinal: Negative for abdominal pain, blood in stool, diarrhea, nausea and vomiting.  Genitourinary: Negative for difficulty urinating, flank pain, frequency and hematuria.  Musculoskeletal: Negative for arthralgias and back pain.  Skin: Negative for rash.  Neurological: Positive for headaches. Negative for dizziness, speech difficulty, weakness and numbness.    Physical Exam Updated Vital Signs BP 137/76   Pulse 96   Temp 99.4 F (37.4 C) (Oral)   Resp (!) 25   Ht 5\' 9"  (1.753 m)   Wt 89.8 kg   SpO2 92%   BMI 29.24 kg/m   Physical Exam Constitutional:      Appearance: He is well-developed.  HENT:     Head: Normocephalic and atraumatic.  Eyes:     Pupils: Pupils are equal, round, and reactive to light.  Cardiovascular:     Rate and Rhythm: Normal rate and regular rhythm.     Heart sounds: Normal heart sounds.  Pulmonary:     Effort: Pulmonary effort is normal. No respiratory distress.     Breath sounds: Normal breath sounds. No wheezing or rales.  Chest:     Chest wall: No tenderness.  Abdominal:     General: Bowel sounds are normal.     Palpations: Abdomen is soft.     Tenderness: There is no abdominal tenderness. There is no guarding or rebound.  Musculoskeletal:        General: Normal  range of motion.     Cervical back: Normal range of motion and neck supple.     Comments: Trace edema to lower extremities bilaterally, no calf tenderness  Lymphadenopathy:     Cervical: No cervical adenopathy.  Skin:    General: Skin is warm and dry.     Findings: No rash.  Neurological:     Mental Status: He is alert and oriented to person, place, and time.     ED Results / Procedures / Treatments   Labs (all labs  ordered are listed, but only abnormal results are displayed) Labs Reviewed  RESP PANEL BY RT-PCR (FLU A&B, COVID) ARPGX2 - Abnormal; Notable for the following components:      Result Value   Influenza A by PCR POSITIVE (*)    All other components within normal limits  COMPREHENSIVE METABOLIC PANEL - Abnormal; Notable for the following components:   Glucose, Bld 117 (*)    Creatinine, Ser 1.31 (*)    GFR, Estimated 55 (*)    All other components within normal limits  CBC WITH DIFFERENTIAL/PLATELET - Abnormal; Notable for the following components:   RBC 3.92 (*)    Hemoglobin 12.3 (*)    HCT 35.3 (*)    Platelets 136 (*)    Lymphs Abs 0.5 (*)    All other components within normal limits  BRAIN NATRIURETIC PEPTIDE    EKG EKG Interpretation  Date/Time:  Sunday Jul 18 2020 09:27:11 EDT Ventricular Rate:  98 PR Interval:  180 QRS Duration: 110 QT Interval:  366 QTC Calculation: 468 R Axis:   86 Text Interpretation: Sinus rhythm Borderline right axis deviation since last tracing no significant change Confirmed by Malvin Johns [phone]) on 07/18/2020 10:57:55 AM   Radiology DG Chest Port 1 View  Result Date: 07/18/2020 CLINICAL DATA:  Cough and fever. EXAM: PORTABLE CHEST 1 VIEW COMPARISON:  None. FINDINGS: The heart size and mediastinal contours are within normal limits. Both lungs are clear. The visualized skeletal structures are unremarkable. IMPRESSION: No active disease. Electronically Signed   By: Dorise Bullion III M.D   On: 07/18/2020 10:00     Procedures Procedures   Medications Ordered in ED Medications  acetaminophen (TYLENOL) tablet 1,000 mg (1,000 mg Oral Given 07/18/20 0936)  sodium chloride 0.9 % bolus 500 mL (500 mLs Intravenous New Bag/Given 07/18/20 1115)    ED Course  I have reviewed the triage vital signs and the nursing notes.  Pertinent labs & imaging results that were available during my care of the patient were reviewed by me and considered in my medical decision making (see chart for details).    MDM Rules/Calculators/A&P                          Patient is a 80 year old male who presents with cough.  He does not report any shortness of breath but appear to be short of breath when he walked into the ED.  He is otherwise well-appearing with no vomiting or other acute symptoms.  Chest x-ray is clear without evidence of pneumonia.  His COVID test is negative but his influenza test is positive which is likely the etiology for his symptoms.  He was complaining of intermittent headaches but denies any current headache.  He does not have any suggestions of meningitis.  He was given of small amount of IV fluids.  He is able to ambulate without shortness of breath or hypoxia.  His oxygen saturation remained above 94% on room air.  He did become mildly tachycardic on ambulation but otherwise is well-appearing and he does not have sustained tachycardia.  He was discharged home in good condition.  We will start Tamiflu at his renally adjusted dose.  His creatinine is mildly elevated but similar to prior values.  Return precautions were given. Final Clinical Impression(s) / ED Diagnoses Final diagnoses:  Influenza    Rx / DC Orders ED Discharge Orders    None       Malvin Johns, MD  07/18/20 1130  

## 2020-07-25 NOTE — Progress Notes (Addendum)
Cardiology Office Note:    Date:  07/29/2020   ID:  John Livingston, DOB [birth year], MRN [id number]  PCP:  Puschinsky, John Him., MD  Cardiologist:  Sanda Klein, MD  Electrophysiologist:  None   Referring MD: Milana Na.,*   Chief Complaint: follow-up of ED visit for elevated heart rate  History of Present Illness:    John Livingston is a 80 y.o. 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 Sallyanne Kuster and presents today for follow-up of ED visit for elevated heart rate.  Patient diagnosed with atrial fibrillation with RVR in 12/2018 in setting of cholelithiasis/cholecystitis. He had associated chest discomfort and was profoundly hypotensive with BP in the 80s/50s at time of diagnosis and actually required emergent cardioversion in the field with restoration of sinus rhythm. He was started on Eliquis and ultimately referred to Atrial Fibrillation clinic and [facility]. 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 Monitor in 03/2019 showed underlying sinus rhythm frequent episodes of NSVT (lasting between 4 beats and 36 seconds) but no recurrence of atrial fibrillation. Therefore, he was started on Metoprolol. Patient was last seen by Dr. Sallyanne Kuster in 10/2019 at which time he was doing well from a cardiac standpoint and was remaining very active.  Patient's wife called our office on 07/14/2020 and reported that thye had to call EMS while in [place] a few days prior after patient woke up at 2am with chest heaviness. He was found to have elevated heart rate fluctuating between 100 and 170 bpm. He was evaluated at in the ED at [facility]. Work-up was unremarkable (please see below).  This visit was arranged for follow-up.  Patient here today with his wife. We discuss the event that led him going to the ED. He states he woke up all of a sudden with a feeling that something heavy had been lifted off his chest and palpitations with feeling that his heart was beating fast and then beating slow. No lightheadedness or dizziness at the time of this event.  No real chest pain or new/worsening shortness of breath from baseline. No syncope. He called EMS and was told his heart rate was bouncing from 100 bpm to 170 bpm. He was taken to the ED. EKG in the ED shows sinus rhythm with sinus arrhythmia, rate 85 bpm, with non-specific ST/T changes. No recurrent symptoms. No orthopnea, PND, or lower extremity edema. He does report feeling very weak at times but states this does not last long. Also reports brief dizziness with quick position changes.  Reviewed Work-Up from ED Visit: - High-sensitivity troponin negative (8) - Pro BNP normal (63)  - CBC: WBC 4.4, Hgb 12.6, Plts 156 - CMET: Na 144, K 3.4, Glucose 129, BUN 26, Cr 1.46, AST 19, ALT 24, Alk Phos 111, Total Bili 0.4.  - Magnesium 2.5 - Lipase 170 - Chest x-ray showed left midlung zone infiltrate and/or atelectasis and right paratracheal soft tissue density deviating the trache to the left. However, chest CT showed no right paratracheal mass or lesions.  Past Medical History:  Diagnosis Date  . Baker's cyst of knee    left  . Cancer of appendix ([place])   . GERD (gastroesophageal reflux disease)   .  History of kidney stones   . Hypertension   . Moderate persistent asthma    pt. denies  . Prostate cancer ([place])   . Ureteral stone     Past Surgical History:  Procedure Laterality Date  . APPENDECTOMY    . cartilage surgery in nose    . CHOLECYSTECTOMY N/A 03/06/2019   Procedure: LAPAROSCOPIC CHOLECYSTECTOMY;  Surgeon: Coralie Keens, MD;  Location: [place];  Service: General;  Laterality: N/A;  . IR EXCHANGE BILIARY DRAIN   02/21/2019  . IR PERC CHOLECYSTOSTOMY  01/17/2019  . IR RADIOLOGIST EVAL & MGMT  02/19/2019    Current Medications: Current Meds  Medication Sig  . acetaminophen (TYLENOL) 650 MG CR tablet Take 1,300 mg by mouth every 8 (eight) hours as needed for pain.  Marland Kitchen albuterol (PROVENTIL HFA;VENTOLIN HFA) 108 (90 BASE) MCG/ACT inhaler Inhale 2 puffs into the lungs every 6 (six) hours as needed for wheezing or shortness of breath.  Marland Kitchen atorvastatin (LIPITOR) 10 MG tablet TAKE 1 TABLET BY MOUTH EVERY DAY  . brimonidine (ALPHAGAN) 0.2 % ophthalmic solution INSTILL 1 DROP INTO EACH EYE THREE TIMES DAILY  . Doxylamine Succinate, Sleep, (SLEEP AID PO) Take 2 tablets by mouth at bedtime.  Marland Kitchen ELIQUIS 5 MG TABS tablet TAKE 1 TABLET BY MOUTH TWICE A DAY  . metoprolol succinate (TOPROL-XL) 25 MG 24 hr tablet TAKE 1 TABLET (25 MG TOTAL) BY MOUTH DAILY. TAKE WITH OR IMMEDIATELY FOLLOWING A MEAL.  Marland Kitchen omeprazole (PRILOSEC) 20 MG capsule Take 20 mg by mouth daily.  . tamsulosin (FLOMAX) 0.4 MG CAPS capsule Take 0.4 mg by mouth daily after supper.   . traMADol (ULTRAM) 50 MG tablet TAKE 1 TABLET (50 MG TOTAL) BY MOUTH EVERY 6 (SIX) HOURS AS NEEDED.  . [DISCONTINUED] TRAVATAN Z 0.004 % SOLN ophthalmic solution Place 1 drop into both eyes 3 (three) times daily.      Allergies:   Patient has no known allergies.   Social History   Socioeconomic History  . Marital status: Married    Spouse name: Not on file  . Number of children: Not on file  . Years of education: Not on file  . Highest education level: Not on file  Occupational History  . Occupation: IT trainer    Comment: [employer]  Tobacco Use  . Smoking status: Former Smoker    Packs/day: 1.00    Years: 20.00    Pack years: 20.00    Types: Cigarettes    Quit date: 03/20/1978    Years since quitting: 42.3  . Smokeless tobacco: Never Used  Vaping Use  . Vaping Use: Never used  Substance and Sexual Activity  . Alcohol use: Not Currently     Alcohol/week: 1.0 standard drink    Types: 1 Cans of beer per week  . Drug use: No  . Sexual activity: Not on file  Other Topics Concern  . Not on file  Social History Narrative  . Not on file   Social Determinants of Health   Financial Resource Strain: Not on file  Food Insecurity: Not on file  Transportation Needs: Not on file  Physical Activity: Not on file  Stress: Not on file  Social Connections: Not on file     Family History: The patient's family history includes Asthma in his son; Heart disease in his mother; Prostate cancer in his father.  ROS:   Please see the history of present illness.     EKGs/Labs/Other Studies Reviewed:  The following studies were reviewed today:  Echocardiogram 02/24/2019: Impressions: 1. Left ventricular ejection fraction, by visual estimation, is 55 to  60%. The left ventricle has normal function. There is mildly increased  left ventricular hypertrophy.  2. Left ventricular diastolic parameters are consistent with Grade I  diastolic dysfunction (impaired relaxation).  3. The left ventricle has no regional wall motion abnormalities.  4. Global right ventricle has normal systolic function.The right  ventricular size is normal. No increase in right ventricular wall  thickness.  5. Left atrial size was normal.  6. Right atrial size was normal.  7. The mitral valve is normal in structure. No evidence of mitral valve  regurgitation. No evidence of mitral stenosis.  8. The tricuspid valve is normal in structure. Tricuspid valve  regurgitation is not demonstrated.  9. The aortic valve is tricuspid. Aortic valve regurgitation is not  visualized. Mild aortic valve sclerosis without stenosis.  10. There is mild dilatation of the aortic root measuring 40 mm.  11. TR signal is inadequate for assessing pulmonary artery systolic  pressure.  _______________  Elwyn Reach Monitor 03/2019:  The dominant rhythm is normal sinus with normal  circadian variation.  There are extremely frequent episodes of nonsustained sustained paroxysmal atrial tachycardia lasting between 4 beats and 36 seconds.  Some of the episodes of atrial tachycardia are associated with symptoms (dizziness).  There are no significant episodes of bradycardia and no meaningful ventricular arrhythmia.   Abnormal event monitor due to very frequent episodes of brief nonsustained sustained ectopic atrial tachycardia.  Although true atrial fibrillation is not detected, findings suggest high likelihood of atrial fibrillation recurrence.  EKG:  EKG ordered today. EKG personally reviewed and demonstrates sinus bradycardia with sinus arrhythmias, rate 53 bpm) with PAC. No acute ST/T changes. Normal axis. Normal PR and QRS intervals. QTc 420 ms.  Recent Labs: 07/18/2020: ALT 17; B Natriuretic Peptide 88.0; BUN 16; Creatinine, Ser 1.31; Hemoglobin 12.3; Platelets 136; Potassium 3.7; Sodium 137  Recent Lipid Panel    Component Value Date/Time   CHOL 175 05/01/2019 0846   TRIG 90 05/01/2019 0846   HDL 37 (L) 05/01/2019 0846   CHOLHDL 4.7 05/01/2019 0846   LDLCALC 121 (H) 05/01/2019 0846    Physical Exam:    Vital Signs: BP 128/62   Pulse (!) 53   Ht 5' 9" (1.753 m)   Wt 203 lb 12.8 oz (92.4 kg)   BMI 30.10 kg/m     Wt Readings from Last 3 Encounters:  07/29/20 203 lb 12.8 oz (92.4 kg)  07/18/20 198 lb (89.8 kg)  10/24/19 195 lb 12.8 oz (88.8 kg)     General: 80 y.o. male in no acute distress. HEENT: Normocephalic and atraumatic. Sclera clear.  Neck: Supple. No carotid bruits.  Heart: RRR. Distinct S1 and S2. No murmurs, gallops, or rubs. Radial  pulses 2+ and equal bilaterally. Lungs: No increased work of breathing. Clear to ausculation bilaterally. No wheezes, rhonchi, or rales.   Abdomen: Soft, non-distended, and non-tender to palpation.  Extremities: No lower extremity edema.    Skin: Warm and dry. Neuro: Alert and oriented x3. No focal  deficits. Psych: Normal affect. Responds appropriately.   Assessment:    1. Paroxysmal atrial fibrillation (HCC)   2. NSVT (nonsustained ventricular tachycardia) ([place])   3. Aortic atherosclerosis ([place])   4. Primary hypertension   5. Hyperlipidemia, unspecified hyperlipidemia type     Plan:    Paroxysmal Atrial Fibrillation Non-Sustained VT - Patient has history of paroxysmal  atrial fibrillation and was also noted to have very frequent episodes of NSVT (lasitng between 4 beats and 36 seconds) on monitor in 03/2019. Recently had an episode where heart rate was fluctuating from 100 bpm to 170 bpm in [place]. Seen in the ED there and work-up unremarkable. No recurrent symptoms. - EKG today shows sinus bradycardia with rate of 53 bpm. - Echo in 02/2019 showed normal LV function. - Continue Toprol-XL 52m daily.  - Continue chronic anticoagulation with Eliquis 559mtwice daily. - [place] been par[place] fibrillation with RVR or NSVT. Do not want to increase beta-blocker due to bradycardia today. Will check CMET, TSH, and Magnesium today. Will also check 1 week Zio monitor to evaluated for tachybrady syndrome and help guide management.  Aortic Atherosclerosis - Incidentally found on CT back in 2015.  - No angina. - [facility] Continue statin.  Hypertension - BP well controlled. - Continue Toprol-XL as above.  - He did describe some orthostais (brief dizziness with quick position changes). Recommended changing positions slowly and compression stockings.  Hyperlipidemia - Lipid panel in 04/2019: Total Cholesterol 175, Triglycerides 90, HDL 37, LDL 121.  - Started on Lipitor 1036maily at that time. Continue.  - Patient not fasting today. Will have him come back for fasting lipid panel.  Disposition: Follow up with Dr. CroSallyanne Kuster 3-4 months. If monitor is concerning, will need to be seen sooner.  ADDENDUM: Correction to above - prior  monitor in 03/2019 showed showed nonsustained and brief sustained SVT not VT. Per Dr. CroVictorino Decembercommendations, we will prescribe patient Lopressor 63m26m take only as needed for symptomatic rapid rates.   Medication Adjustments/Labs and Tests Ordered: Current medicines are reviewed at length with the patient today.  Concerns regarding medicines are outlined above.  Orders Placed This Encounter  Procedures  . Comprehensive metabolic panel  . TSH  . Magnesium  . Lipid panel  . LONG TERM MONITOR (3-14 DAYS)   No orders of the defined types were placed in this encounter.   Patient Instructions  Medication Instructions:  The current medical regimen is effective;  continue present plan and medications.  *If you need a refill on your cardiac medications before your next appointment, please call your pharmacy*   Lab Work: CMET, TSH, MAG today LIPID (come back fasting for this)  If you have labs (blood work) drawn today and your tests are completely normal, you will receive your results only by: . MyMarland Kitchenhart Message (if you have MyChart) OR . A paper copy in the mail If you have any lab test that is abnormal or we need to change your treatment, we will call you to review the results.   Testing/Procedures: ZIO Bryn Gullingng Term Monitor Instructions   Your physician has requested you wear a ZIO patch monitor for _7__ days.  This is a single patch monitor.   [employer] supplies one patch monitor per enrollment. Additional stickers are not available. Please do not apply patch if you will be having a Nuclear Stress Test, Echocardiogram, Cardiac CT, MRI, or Chest Xray during the period you would be wearing the monitor. The patch cannot be worn during these tests. You cannot remove and re-apply the ZIO XT patch monitor.  Your ZIO patch monitor will be sent Fed Ex from [employer] to your home address. It may take 3-5 days to receive your monitor after you have been enrolled.  Once  you have  received your monitor, please review the enclosed instructions. Your monitor has already been registered assigning a specific monitor serial # to you.  Billing and Patient Assistance Program Information   We have supplied [employer] with any of your insurance information on file for billing purposes. [employer] offers a sliding scale Patient Assistance Program for patients that do not have insurance, or whose insurance does not completely cover the cost of the ZIO monitor.   You must apply for the Patient Assistance Program to qualify for this discounted rate.     To apply, please call [employer] at [phone], select option 4, then select option 2, and ask to apply for Patient Assistance Program.  Theodore Demark will ask your household income, and how many people are in your household.  They will quote your out-of-pocket cost based on that information.  [employer] will also be able to set up a 44-month interest-free payment plan if needed.  Applying the monitor   Shave hair from upper left chest.  Hold abrader disc by orange tab. Rub abrader in 40 strokes over the upper left chest as indicated in your monitor instructions.  Clean area with 4 enclosed alcohol pads. Let dry.  Apply patch as indicated in monitor instructions. Patch will be placed under collarbone on left side of chest with arrow pointing upward.  Rub patch adhesive wings for 2 minutes. Remove white label marked "1". Remove the white label marked "2". Rub patch adhesive wings for 2 additional minutes.  While looking in a mirror, press and release button in center of patch. A small green light will flash 3-4 times. This will be your only indicator that the monitor has been turned on. ?  Do not shower for the first 24 hours. You may shower after the first 24 hours.  Press the button if you feel a symptom. You will hear a small click. Record Date, Time and Symptom in the Patient Logbook.  When you are ready to remove the patch, follow  instructions on the last 2 pages of the Patient Logbook. Stick patch monitor onto the last page of Patient Logbook.  Place Patient Logbook in the blue and white box.  Use locking tab on box and tape box closed securely.  The blue and white box has prepaid postage on it. Please place it in the mailbox as soon as possible. Your physician should have your test results approximately 7 days after the monitor has [facility]  Call [place] [phone] you have questions regarding your ZIO XT patch monitor. Call them immediately if you see an orange light blinking on your monitor.  If your monitor falls off in less than 4 days, contact our Monitor department at [phone] ?If your monitor becomes loose or falls off after 4 days call [employer] at [phone] suggestions on securing your monitor.?   Follow-Up: [facility] you and your health needs are our priority.  As part of our continuing mission to provide you with exceptional heart care, we have created designated Provider Care Teams.  These Care Teams include your primary Cardiologist (physician) and Advanced Practice Providers (APPs -  Physician Assistants and Nurse Practitioners) who all work together to provide you with the care you need, when you need it.  We recommend signing up for the patient portal called "MyChart".  Sign up information is provided on this After Visit Summary.  MyChart is used to connect with patients for Virtual Visits (Telemedicine).  Patients are  able to view lab/test results, encounter notes, upcoming appointments, etc.  Non-urgent messages can be sent to your provider as well.   To learn more about what you can do with MyChart, go to [email or web address].    Your next appointment:   4 month(s)  The format for your next appointment:   In Person  Provider:   You may see Sanda Klein, MD or one of the following Advanced Practice Providers on your  designated Care Team:    Almyra Deforest, PA-C  Fabian Sharp, [place] or   Roby Lofts, [place]    Other Instructions Use compression stockings (you may get these at your local pharmacy)     Signed, Darreld Mclean, PA-C  07/29/2020 12:21 PM    [place]

## 2020-07-29 ENCOUNTER — Ambulatory Visit (INDEPENDENT_AMBULATORY_CARE_PROVIDER_SITE_OTHER): Payer: Medicare Other

## 2020-07-29 ENCOUNTER — Other Ambulatory Visit: Payer: Self-pay

## 2020-07-29 ENCOUNTER — Ambulatory Visit (INDEPENDENT_AMBULATORY_CARE_PROVIDER_SITE_OTHER): Payer: Medicare Other | Admitting: Student

## 2020-07-29 ENCOUNTER — Encounter: Payer: Self-pay | Admitting: Student

## 2020-07-29 VITALS — BP 128/62 | HR 53 | Ht 69.0 in | Wt 203.8 lb

## 2020-07-29 DIAGNOSIS — I700 Atherosclerosis of aorta: Secondary | ICD-10-CM

## 2020-07-29 DIAGNOSIS — I4729 Other ventricular tachycardia: Secondary | ICD-10-CM

## 2020-07-29 DIAGNOSIS — I480 Paroxysmal atrial fibrillation: Secondary | ICD-10-CM

## 2020-07-29 DIAGNOSIS — I472 Ventricular tachycardia: Secondary | ICD-10-CM

## 2020-07-29 DIAGNOSIS — I10 Essential (primary) hypertension: Secondary | ICD-10-CM

## 2020-07-29 DIAGNOSIS — E785 Hyperlipidemia, unspecified: Secondary | ICD-10-CM

## 2020-07-29 LAB — COMPREHENSIVE METABOLIC PANEL
ALT: 15 IU/L (ref 0–44)
AST: 18 IU/L (ref 0–40)
Albumin/Globulin Ratio: 2 (ref 1.2–2.2)
Albumin: 4.5 g/dL (ref 3.6–4.6)
Alkaline Phosphatase: 78 IU/L (ref 44–121)
BUN/Creatinine Ratio: 13 (ref 10–24)
BUN: 15 mg/dL (ref 8–27)
Bilirubin Total: 0.4 mg/dL (ref 0.0–1.2)
CO2: 26 mmol/L (ref 20–29)
Calcium: 9.4 mg/dL (ref 8.6–10.2)
Chloride: 104 mmol/L (ref 96–106)
Creatinine, Ser: 1.13 mg/dL (ref 0.76–1.27)
Globulin, Total: 2.3 g/dL (ref 1.5–4.5)
Glucose: 90 mg/dL (ref 65–99)
Potassium: 5.1 mmol/L (ref 3.5–5.2)
Sodium: 142 mmol/L (ref 134–144)
Total Protein: 6.8 g/dL (ref 6.0–8.5)
eGFR: 65 mL/min/{1.73_m2} (ref >59)

## 2020-07-29 LAB — TSH: TSH: 4.83 u[IU]/mL — ABNORMAL HIGH (ref 0.450–4.500)

## 2020-07-29 LAB — MAGNESIUM: Magnesium: 2.3 mg/dL (ref 1.6–2.3)

## 2020-07-29 NOTE — Progress Notes (Unsigned)
Patient enrolled for [employer] to ship a 7 day ZIO XT long term holter monitor to his home. 

## 2020-07-29 NOTE — Progress Notes (Signed)
To clarify, the previous monitor showed nonsustained and brief sustained SVT not VT. But I agree with your plan. The only way to increase the daily beta blocker or otherwise lessen the rapid rates would be if we put in a pacemaker. This may be in his future. But we can prescribe an "as needed" extra 25 mg of metoprolol tartrate to take for symptomatic rapid rates.

## 2020-07-29 NOTE — Patient Instructions (Signed)
Medication Instructions:  The current medical regimen is effective;  continue present plan and medications.  *If you need a refill on your cardiac medications before your next appointment, please call your pharmacy*   Lab Work: CMET, TSH, MAG today LIPID (come back fasting for this)  If you have labs (blood work) drawn today and your tests are completely normal, you will receive your results only by: Marland Kitchen MyChart Message (if you have MyChart) OR . A paper copy in the mail If you have any lab test that is abnormal or we need to change your treatment, we will call you to review the results.   Testing/Procedures: Bryn Gulling- Long Term Monitor Instructions   Your physician has requested you wear a ZIO patch monitor for _7__ days.  This is a single patch monitor.   IRhythm supplies one patch monitor per enrollment. Additional stickers are not available. Please do not apply patch if you will be having a Nuclear Stress Test, Echocardiogram, Cardiac CT, MRI, or Chest Xray during the period you would be wearing the monitor. The patch cannot be worn during these tests. You cannot remove and re-apply the ZIO XT patch monitor.  Your ZIO patch monitor will be sent Fed Ex from [employer] directly to your home address. It may take 3-5 days to receive your monitor after you have been enrolled.  Once you have received your monitor, please review the enclosed instructions. Your monitor has already been registered assigning a specific monitor serial # to you.  Billing and Patient Assistance Program Information   We have supplied [employer] with any of your insurance information on file for billing purposes. [employer] offers a sliding scale Patient Assistance Program for patients that do not have insurance, or whose insurance does not completely cover the cost of the ZIO monitor.   You must apply for the Patient Assistance Program to qualify for this discounted rate.     To apply, please call IR[employer] at  [phone], select option 4, then select option 2, and ask to apply for Patient Assistance Program.  Theodore Demark will ask your household income, and how many people are in your household.  They will quote your out-of-pocket cost based on that information.  [employer] will also be able to set up a 53-month, interest-free payment plan if needed.  Applying the monitor   Shave hair from upper left chest.  Hold abrader disc by orange tab. Rub abrader in 40 strokes over the upper left chest as indicated in your monitor instructions.  Clean area with 4 enclosed alcohol pads. Let dry.  Apply patch as indicated in monitor instructions. Patch will be placed under collarbone on left side of chest with arrow pointing upward.  Rub patch adhesive wings for 2 minutes. Remove white label marked "1". Remove the white label marked "2". Rub patch adhesive wings for 2 additional minutes.  While looking in a mirror, press and release button in center of patch. A small green light will flash 3-4 times. This will be your only indicator that the monitor has been turned on. ?  Do not shower for the first 24 hours. You may shower after the first 24 hours.  Press the button if you feel a symptom. You will hear a small click. Record Date, Time and Symptom in the Patient Logbook.  When you are ready to remove the patch, follow instructions on the last 2 pages of the Patient Logbook. Stick patch monitor onto the last page of Patient Logbook.  Place  Patient Logbook in the blue and white box.  Use locking tab on box and tape box closed securely.  The blue and white box has prepaid postage on it. Please place it in the mailbox as soon as possible. Your physician should have your test results approximately 7 days after the monitor has been mailed back to [facility].  Call [place] at [phone] if you have questions regarding your ZIO XT patch monitor. Call them immediately if you see an orange light blinking on  your monitor.  If your monitor falls off in less than 4 days, contact our Monitor department at [phone]. ?If your monitor becomes loose or falls off after 4 days call [employer] at [phone] for suggestions on securing your monitor.?   Follow-Up: At [facility], you and your health needs are our priority.  As part of our continuing mission to provide you with exceptional heart care, we have created designated Provider Care Teams.  These Care Teams include your primary Cardiologist (physician) and Advanced Practice Providers (APPs -  Physician Assistants and Nurse Practitioners) who all work together to provide you with the care you need, when you need it.  We recommend signing up for the patient portal called "MyChart".  Sign up information is provided on this After Visit Summary.  MyChart is used to connect with patients for Virtual Visits (Telemedicine).  Patients are able to view lab/test results, encounter notes, upcoming appointments, etc.  Non-urgent messages can be sent to your provider as well.   To learn more about what you can do with MyChart, go to [email or web address].    Your next appointment:   4 month(s)  The format for your next appointment:   In Person  Provider:   You may see Sanda Klein, MD or one of the following Advanced Practice Providers on your designated Care Team:    Almyra Deforest, PA-C  Fabian Sharp, [place] or   Roby Lofts, [place]    Other Instructions Use compression stockings (you may get these at your local pharmacy)

## 2020-07-29 NOTE — Addendum Note (Signed)
Addended by: Caprice Beaver T on: 07/29/2020 01:55 PM   Modules accepted: Orders

## 2020-08-01 DIAGNOSIS — I472 Ventricular tachycardia: Secondary | ICD-10-CM

## 2020-08-01 DIAGNOSIS — I480 Paroxysmal atrial fibrillation: Secondary | ICD-10-CM | POA: Diagnosis not present

## 2020-08-03 ENCOUNTER — Ambulatory Visit: Payer: Medicare Other | Admitting: Physician Assistant

## 2020-08-12 ENCOUNTER — Telehealth: Payer: Self-pay | Admitting: Student

## 2020-08-16 ENCOUNTER — Other Ambulatory Visit: Payer: Self-pay | Admitting: Cardiovascular Disease

## 2020-08-17 NOTE — Telephone Encounter (Signed)
Prescription refill request for Eliquis received. Indication:atrial fib Last office visit:5/22 Scr:1.3 Age: 80 Weight:92.4 kg  Prescription refilled

## 2020-08-20 ENCOUNTER — Other Ambulatory Visit: Payer: Self-pay | Admitting: *Deleted

## 2020-08-20 MED ORDER — METOPROLOL SUCCINATE ER 25 MG PO TB24: 25.0000 mg | ORAL_TABLET | Freq: Every day | ORAL | 3 refills | Status: DC

## 2020-10-07 IMAGING — XA IR CHOLECYSTOSTOMY
4 series · 4 of 4 positions shown · non-contrast
Comparison: none

INDICATION: 78-year-old with acute cholecystitis. Patient is not a surgical
candidate at this time.

[Series 1: fl angio · 1 of 1 slices shown (1 of 4)]
[im 1/1]
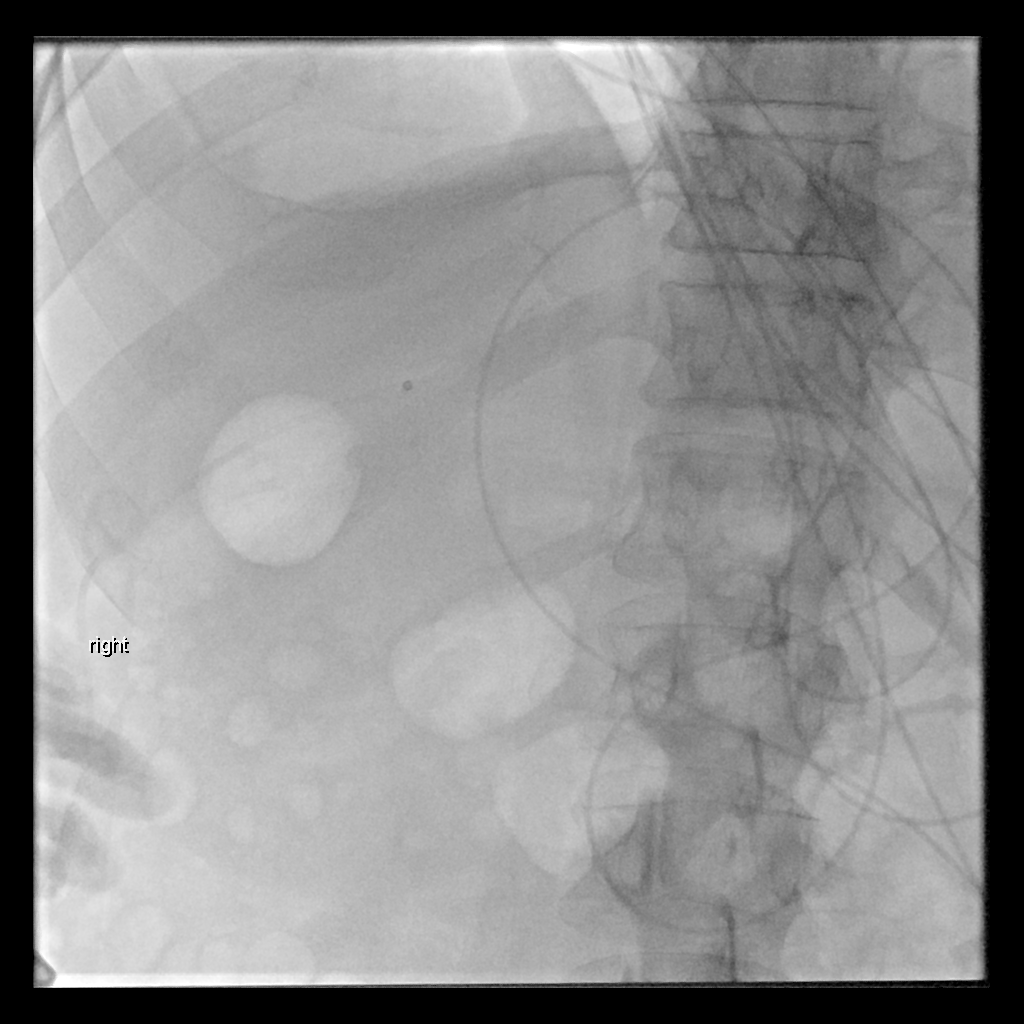

[Series 2: fl angio · 1 of 1 slices shown (2 of 4)]
[im 1/1]
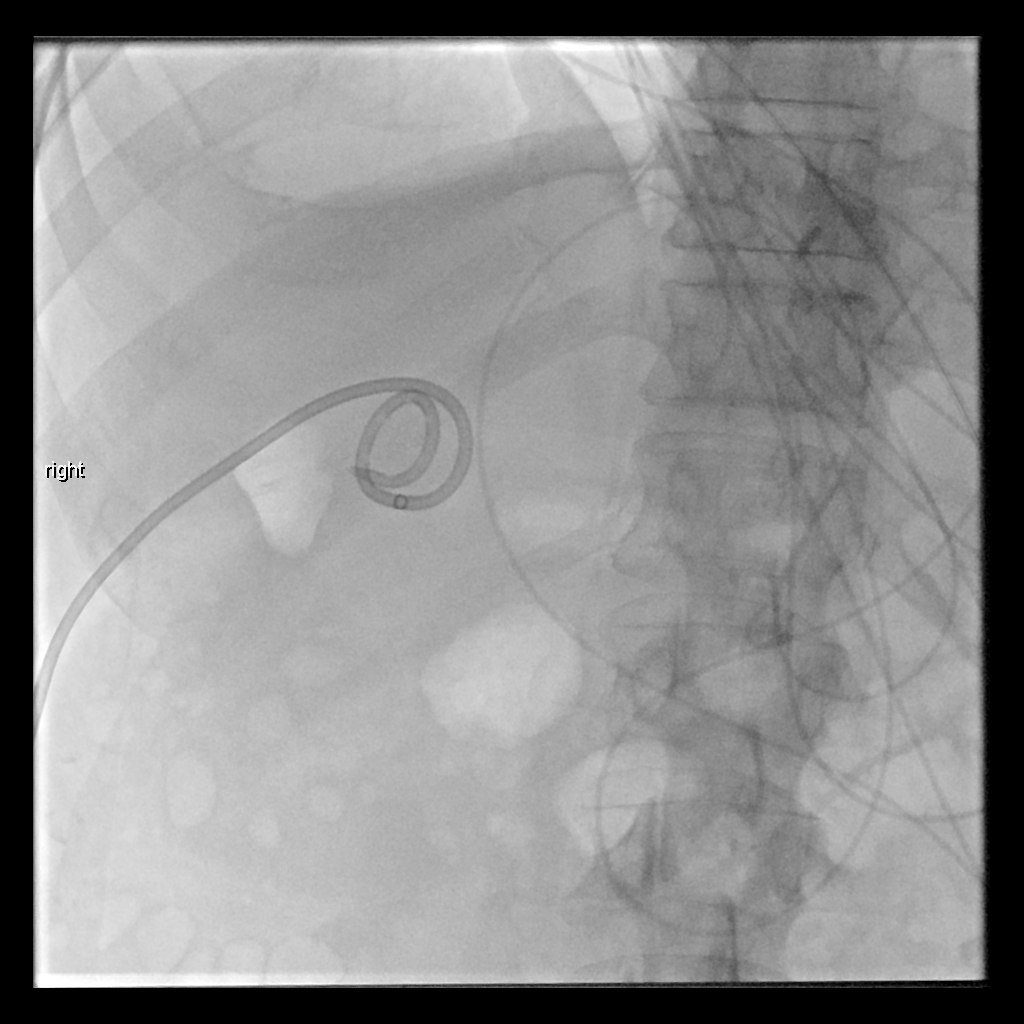

[Series 3: fl angio · 1 of 1 slices shown (3 of 4)]
[im 1/1]
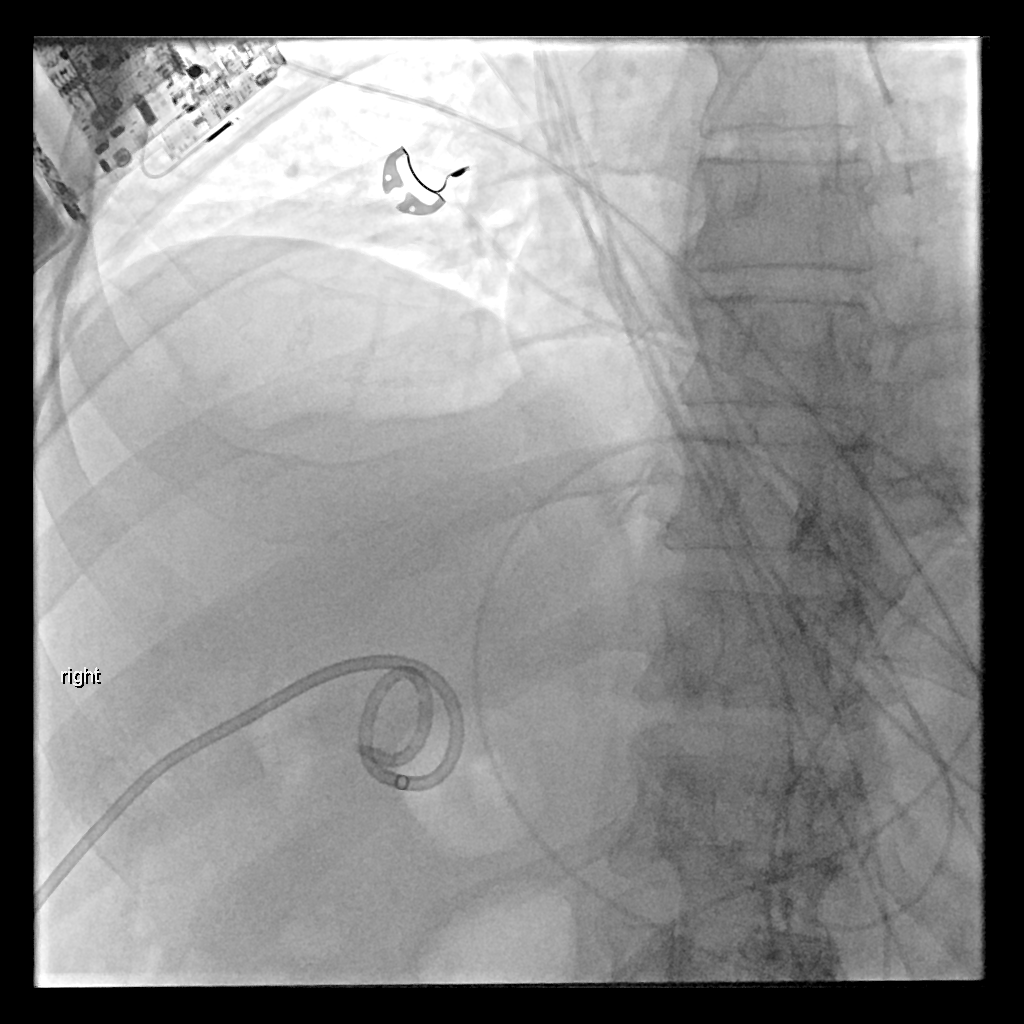

[Series 4: fl angio · 1 of 1 slices shown (4 of 4)]
[im 1/1]
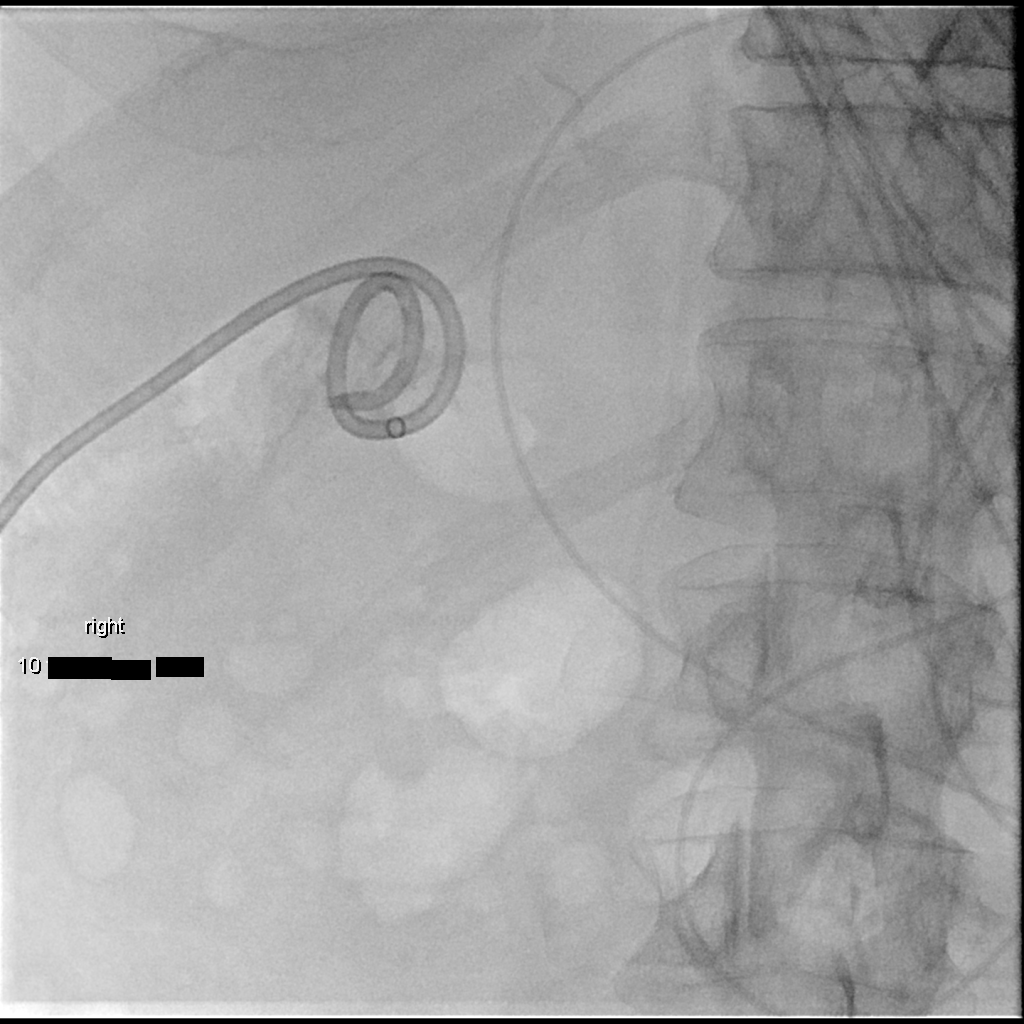

[4 of 4 positions shown; findings below may reference images not displayed]

EXAM:
IMAGE GUIDED PERCUTANEOUS CHOLECYSTOSTOMY TUBE PLACEMENT

MEDICATIONS:
Inpatient and receiving IV antibiotics

ANESTHESIA/SEDATION:
Moderate (conscious) sedation was employed during this procedure. A
total of Versed 1.0 mg and Fentanyl 100 mcg was administered
intravenously.

Moderate Sedation Time: 21 minutes. The patient's level of
consciousness and vital signs were monitored continuously by
radiology nursing throughout the procedure under my direct
supervision.

FLUOROSCOPY TIME:  Fluoroscopy Time: 1 minutes, 30 seconds, 34 mGy

COMPLICATIONS:
None immediate.

CONTRAST:  7 mL Omnipaque 300

PROCEDURE:
Informed written consent was obtained from the patient after a
thorough discussion of the procedural risks, benefits and
alternatives. All questions were addressed. Maximal Sterile Barrier
Technique was utilized including caps, mask, sterile gowns, sterile
gloves, sterile drape, hand hygiene and skin antiseptic. A timeout
was performed prior to the initiation of the procedure.

Ultrasound was used to identify the gallbladder. There appeared to
be a large amount of gas within the gallbladder and this is
confirmed with fluoroscopy. The right abdomen was prepped and draped
in sterile fashion. Skin was anesthetized with 1% lidocaine. Using
ultrasound guidance, 21 gauge needle was directed directly into the
gallbladder. 0.018 wire was placed. An Accustick dilator set was
placed. Tract was dilated over a J wire. A 10.2 French multipurpose
drain was advanced over the wire and placed in the gallbladder.
Combination of gas and dark bile was aspirated from the gallbladder.
The gallbladder is decompressed based on ultrasound after drain
placement. Sample of bile was sent for culture. Catheter was sutured
to the skin and attached to a gravity bag.

Fluoroscopic and ultrasound images were taken and saved for
documentation.
FINDINGS: Shadowing within the gallbladder suggestive for gas. Gas was
confirmed with fluoroscopy. 10.2 French drain was placed in the
gallbladder and the gallbladder was decompressed after drain
placement. Following drain placement, the gallbladder was obscured
on ultrasound by gas. Not clear if the patient had subcutaneous or
free intraperitoneal air. Bile and gas was aspirated from the drain.
Drain injection confirmed placement in the gallbladder.
IMPRESSION: Successful percutaneous cholecystostomy tube placement with
ultrasound and fluoroscopic guidance.

Due to the gas within the gallbladder, follow-up CT was performed
following drain placement to exclude bowel involvement.

## 2020-11-04 ENCOUNTER — Ambulatory Visit (INDEPENDENT_AMBULATORY_CARE_PROVIDER_SITE_OTHER): Payer: Medicare Other | Admitting: Cardiovascular Disease

## 2020-11-04 ENCOUNTER — Encounter: Payer: Self-pay | Admitting: Cardiovascular Disease

## 2020-11-04 ENCOUNTER — Other Ambulatory Visit: Payer: Self-pay

## 2020-11-04 VITALS — BP 118/62 | HR 90 | Resp 20 | Ht 69.0 in | Wt 201.4 lb

## 2020-11-04 DIAGNOSIS — Z7901 Long term (current) use of anticoagulants: Secondary | ICD-10-CM | POA: Diagnosis not present

## 2020-11-04 DIAGNOSIS — R42 Dizziness and giddiness: Secondary | ICD-10-CM

## 2020-11-04 DIAGNOSIS — I9589 Other hypotension: Secondary | ICD-10-CM | POA: Diagnosis not present

## 2020-11-04 DIAGNOSIS — I700 Atherosclerosis of aorta: Secondary | ICD-10-CM

## 2020-11-04 DIAGNOSIS — I480 Paroxysmal atrial fibrillation: Secondary | ICD-10-CM

## 2020-11-04 DIAGNOSIS — R55 Syncope and collapse: Secondary | ICD-10-CM

## 2020-11-04 DIAGNOSIS — N2889 Other specified disorders of kidney and ureter: Secondary | ICD-10-CM

## 2020-11-04 DIAGNOSIS — E8582 Wild-type transthyretin-related (ATTR) amyloidosis: Secondary | ICD-10-CM

## 2020-11-04 DIAGNOSIS — C61 Malignant neoplasm of prostate: Secondary | ICD-10-CM

## 2020-11-04 MED ORDER — ALFUZOSIN HCL ER 10 MG PO TB24: 10.0000 mg | ORAL_TABLET | Freq: Every day | ORAL | 4 refills | Status: DC

## 2020-11-04 MED ORDER — METOPROLOL SUCCINATE ER 25 MG PO TB24: 12.5000 mg | ORAL_TABLET | Freq: Every day | ORAL | 3 refills | Status: DC

## 2020-11-04 NOTE — Progress Notes (Signed)
Cardiology Office Note:    Date:  11/09/2020   ID:  [name], DOB 14-Jul-1940, MRN [id number]  PCP:  Puschinsky, Fransico Him., MD  Cardiologist:  Tinaya Ceballos Electrophysiologist:  None   Referring MD: Milana Na.,*   No chief complaint on file.    History of Present Illness:    John Livingston is a 80 y.o. male with asymptomatic paroxysmal atrial fibrillation, incidentally discovered aortic atherosclerosis here for f/u.  He has not had problems with shortness of breath of angina, but he has frequent bouts with hypotension, which is often symptomatic.  He has not had full-blown syncope, but feels extremely weak.  The hypotension persists even when he is sitting down it is not follow a typical pattern of orthostatic hypotension.  He has been keeping a detailed record of his blood pressure which has varied between 73/49 and 158/75, with the majority of readings less than 100/60.  He is worried that his dizziness may also be related to carotid stenosis.  He is taking a very low-dose of metoprolol for atrial fibrillation rate control and also takes tamsulosin for prostatism.  He has serious difficulties with urination if he does not take an alpha-blocker.  He has not had any falls or injuries or serious bleeding problems and is compliant with Eliquis.  He does not have a history of carpal tunnel syndrome or lumbar spine stenosis.  His ECG shows sinus bradycardia with normal voltage.  He does not look particularly suspicious for cardiac amyloidosis.  His most recent labs showed normal sodium level, but a borderline high potassium at 5.1.  Creatinine is 1.13.  His abdominal CT is described "advanced atherosclerotic calcification of the aorta".  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  Additional problems include prostate cancer treated with radiation therapy, GERD on PPI, longstanding hypertension, mild hypercholesterolemia.Marland Kitchen  He  does not have known CAD, PAD, diabetes mellitus and does not smoke.  Past Medical History:  Diagnosis Date   Baker's cyst of knee    left   Cancer of appendix ([place])    GERD (gastroesophageal reflux disease)    History of kidney stones    Hypertension    Moderate persistent asthma    pt. denies   Prostate cancer ([place])    Ureteral stone     Past Surgical History:  Procedure Laterality Date   APPENDECTOMY     cartilage surgery in nose     CHOLECYSTECTOMY N/A 03/06/2019   Procedure: LAPAROSCOPIC CHOLECYSTECTOMY;  Surgeon: Coralie Keens, MD;  Location: [place];  Service: General;  Laterality: N/A;   IR EXCHANGE BILIARY DRAIN  02/21/2019   IR PERC CHOLECYSTOSTOMY  01/17/2019   IR RADIOLOGIST EVAL & MGMT  02/19/2019    Current Medications: Current Meds  Medication Sig   acetaminophen (TYLENOL) 650 MG CR tablet Take 1,300 mg by mouth every 8 (eight) hours as needed for pain.   albuterol (PROVENTIL HFA;VENTOLIN HFA) 108 (90 BASE) MCG/ACT inhaler Inhale 2 puffs into the lungs every 6 (six) hours as needed for wheezing or shortness of breath.   alfuzosin (UROXATRAL) 10 MG 24 hr tablet Take 1 tablet (10 mg total) by mouth daily with supper.   atorvastatin (LIPITOR) 10 MG tablet TAKE 1 TABLET BY MOUTH EVERY DAY   brimonidine (ALPHAGAN) 0.2 % ophthalmic solution INSTILL 1 DROP INTO EACH EYE THREE TIMES DAILY   Doxylamine Succinate, Sleep, (SLEEP AID PO) Take 2 tablets by mouth at  bedtime.   ELIQUIS 5 MG TABS tablet TAKE 1 TABLET BY MOUTH TWICE A DAY   lipase/protease/amylase (CREON) 12000-38000 units CPEP capsule Take by mouth. Pt isn't sure of dosage   omeprazole (PRILOSEC) 20 MG capsule Take 20 mg by mouth daily.   [DISCONTINUED] metoprolol succinate (TOPROL-XL) 25 MG 24 hr tablet Take 1 tablet (25 mg total) by mouth daily. Take with or immediately following a meal. May take half a tablet (12.5 mg) as needed once daily for palpitations.   [DISCONTINUED] tamsulosin (FLOMAX) 0.4 MG CAPS  capsule Take 0.4 mg by mouth daily after supper.      Allergies:   Patient has no known allergies.   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    Comment: [employer]  Tobacco Use   Smoking status: Former    Packs/day: 1.00    Years: 20.00    Pack years: 20.00    Types: Cigarettes    Quit date: 03/20/1978    Years since quitting: 42.6   Smokeless tobacco: Never  Vaping Use   Vaping Use: Never used  Substance and Sexual Activity   Alcohol use: Not Currently    Alcohol/week: 1.0 standard drink    Types: 1 Cans of beer per week   Drug use: No   Sexual activity: Not on file  Other Topics Concern   Not on file  Social History Narrative   Not on file   Social Determinants of Health   Financial Resource Strain: Not on file  Food Insecurity: Not on file  Transportation Needs: Not on file  Physical Activity: Not on file  Stress: Not on file  Social Connections: Not on file     Family History: The patient's family history includes Asthma in his son; Heart disease in his mother; Prostate cancer in his father.  ROS:   Please see the history of present illness.    All other systems are reviewed and are negative.   EKGs/Labs/Other Studies Reviewed:    The following studies were reviewed today: ECHO 02/24/2019:  1. Left ventricular ejection fraction, by visual estimation, is 55 to  60%. The left ventricle has normal function. There is mildly increased  left ventricular hypertrophy.   2. Left ventricular diastolic parameters are consistent with Grade I  diastolic dysfunction (impaired relaxation).   3. The left ventricle has no regional wall motion abnormalities.   4. Global right ventricle has normal systolic function.The right  ventricular size is normal. No  increase in right ventricular wall  thickness.   5. Left atrial size was normal.   6. Right atrial size was normal.   7. The mitral valve is normal in structure. No evidence of mitral valve  regurgitation. No evidence of mitral stenosis.   8. The tricuspid valve is normal in structure. Tricuspid valve  regurgitation is not demonstrated.   9. The aortic valve is tricuspid. Aortic valve regurgitation is not  visualized. Mild aortic valve sclerosis without stenosis.  10. There is mild dilatation of the aortic root measuring 40 mm.  11. TR signal is inadequate for assessing pulmonary artery systolic  pressure.  EKG:  EKG is ordered today shows sinus rhythm with PACs are present of bigeminy, otherwise normal tracing  EMS twelve-lead ECG from 01/13/2019 at 12: 16: 46 AM shows atrial fibrillation with rapid ventricular rate and diffuse nonspecific ST-T wave changes.  Recent Labs: 07/18/2020: B Natriuretic Peptide 88.0; Hemoglobin 12.3; Platelets 136 07/29/2020: ALT 15; Magnesium 2.3; TSH 4.830 11/04/2020: BUN 18; Creatinine, Ser 1.15; Potassium 5.1; Sodium 141  Recent Lipid Panel    Component Value Date/Time   CHOL 175 05/01/2019 0846   TRIG 90 05/01/2019 0846   HDL 37 (L) 05/01/2019 0846   CHOLHDL 4.7 05/01/2019 0846   LDLCALC 121 (H) 05/01/2019 0846    Physical Exam:    VS:  BP 118/62   Pulse 90   Resp 20   Ht '5\' 9"'$  (1.753 m)   Wt 201 lb 6.4 oz (91.4 kg)   SpO2 96%   BMI 29.74 kg/m     Wt Readings from Last 3 Encounters:  11/04/20 201 lb 6.4 oz (91.4 kg)  07/29/20 203 lb 12.8 oz (92.4 kg)  07/18/20 198 lb (89.8 kg)      General: Alert, oriented x3, no distress, appears fitter/stronger than expected for age, but also mildly obese Head: no evidence of trauma, PERRL, EOMI, no exophtalmos or lid lag, no myxedema, no xanthelasma; normal ears, nose and oropharynx Neck: normal jugular venous pulsations and no hepatojugular reflux; brisk carotid pulses without delay and no carotid  bruits Chest: clear to auscultation, no signs of consolidation by percussion or palpation, normal fremitus, symmetrical and full respiratory excursions Cardiovascular: normal position and quality of the apical impulse, regular rhythm, normal first and second heart sounds, no murmurs, rubs or gallops Abdomen: no tenderness or distention, no masses by palpation, no abnormal pulsatility or arterial bruits, normal bowel sounds, no hepatosplenomegaly Extremities: no clubbing, cyanosis or edema; 2+ radial, ulnar and brachial pulses bilaterally; 2+ right femoral, posterior tibial and dorsalis pedis pulses; 2+ left femoral, posterior tibial and dorsalis pedis pulses; no subclavian or femoral bruits Neurological: grossly nonfocal Psych: Normal mood and affect   ASSESSMENT:    1. AF (paroxysmal atrial fibrillation) (HCC)   2. Other specified hypotension   3. Long term current use of anticoagulant   4. Aortic atherosclerosis ([place])   5. Prostate cancer ([place])   6. Renal mass, right   7. Dizziness   8. Paroxysmal atrial fibrillation (HCC)   9. Near syncope   10. Wild-type transthyretin-related (ATTR) amyloidosis (HCC)     PLAN:    In order of problems listed above:  AFib: No symptomatic events.  In sinus rhythm today.  Follow-up monitor showed extremely frequent episodes of paroxysmal atrial tachycardia.  CHA2DS2-VASc 4 (age 56, hypertension, aortic atherosclerosis).  Until recently was tolerating the low-dose of metoprolol, but currently with problems with hypotension. Hypotension: He used to have high blood pressure.  Only current medications that can be incriminated are the metoprolol and the alpha-blocker for urinary bladder outlet obstruction.  We will try switching from tamsulosin to Uroxatrol which might have a slightly better profile as far as hemodynamic side effects.  Consider adrenal insufficiency.  We will check a random a.m. cortisol level.  Cardiac amyloidosis is considered, but he really  does not have any extracardiac manifestations for this or evidence of low voltage on his ECG.  Check pyrophosphate scan. Anticoagulation: Eliquis is well-tolerated.  He has not had any bleeding problems.  Has near normal renal function. Aortic atherosclerosis: on statin.  He does not have angina or claudication. On statin.  Overdue for lipid profile. Prostate cancer: s/p XRT. 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  Followed by Dr. Burnell Blanks at [facility] medical   Medication Adjustments/Labs and Tests Ordered: Current medicines are reviewed at length with the patient today.  Concerns regarding medicines are outlined above.  Orders Placed This Encounter  Procedures   Basic metabolic panel   Cortisol-am, blood   MYOCARDIAL AMYLOID IMAGING PLANAR AND SPECT   VAS US CAROTID    Meds ordered this encounter  Medications   alfuzosin (UROXATRAL) 10 MG 24 hr tablet    Sig: Take 1 tablet (10 mg total) by mouth daily with supper.    Dispense:  30 tablet    Refill:  4   metoprolol succinate (TOPROL-XL) 25 MG 24 hr tablet    Sig: Take 0.5 tablets (12.5 mg total) by mouth daily. Take with or immediately following a meal. May take an extra half a tablet (12.5 mg) as needed once daily for palpitations.    Dispense:  45 tablet    Refill:  3     Patient Instructions  Medication Instructions:  DECREASE the Metoprolol to 12.5 mg once daily (half a tablet) START Uroxatral 10 mg once daily  STOP the Flomax *If you need a refill on your cardiac medications before your next appointment, please call your pharmacy*   Lab Work: Your provider would like for you to have the following labs today: BMET and Cortisol  If you have labs (blood work) drawn today and your tests are completely normal, you will receive your results only by: [place] (if you have MyChart) OR A paper copy in the mail If you have any lab test that is abnormal or we need to change your treatment, we will call you to  review the results.   Testing/Procedures: Your physician has requested that you have a carotid duplex. This test is an ultrasound of the carotid arteries in your neck. It looks at blood flow through these arteries that supply the brain with blood. Allow one hour for this exam. There are no restrictions or special instructions. This will take place at [place], [address].  Myocardial Amyloid Planar and Spect Scan - Your physician has requested that you have a scan to evaluate for cardiac amyloidosis. This test "outlines" the heart to determine if you have any amyloid protein deposits located in your heart. This will be performed here at our [place] location. The test will take approximately 2 hours to complete. Please do not wear any cologne or fragrances.   Follow-Up: At [facility], you and your health needs are our priority.  As part of our continuing mission to provide you with exceptional heart care, we have created designated Provider Care Teams.  These Care Teams include your primary Cardiologist (physician) and Advanced Practice Providers (APPs -  Physician Assistants and Nurse Practitioners) who all work together to provide you with the care you need, when you need it.  We recommend signing up for the patient portal called "MyChart".  Sign up information is provided on this After Visit Summary.  MyChart is used to connect with patients for Virtual Visits (Telemedicine).  Patients are able to view lab/test results, encounter notes, upcoming appointments, etc.  Non-urgent messages can be sent to your provider as well.   To learn more about what you can do with MyChart, go to [email or web address].    Your next appointment:   3 month(s)  The format for your next appointment:   In Person  Provider:   You may see Sanda Klein, MD or  one of the following Advanced Practice Providers on your designated Care Team:   Almyra Deforest, PA-C Fabian Sharp, [place] or  Roby Lofts,  [place]   Other Instructions Please increase the sodium in your diet (liberally) to help with hypotension   Signed, Sanda Klein, MD  11/09/2020 8:30 PM    [place]

## 2020-11-04 NOTE — Patient Instructions (Addendum)
Medication Instructions:  DECREASE the Metoprolol to 12.5 mg once daily (half a tablet) START Uroxatral 10 mg once daily  STOP the Flomax *If you need a refill on your cardiac medications before your next appointment, please call your pharmacy*   Lab Work: Your provider would like for you to have the following labs today: BMET and Cortisol  If you have labs (blood work) drawn today and your tests are completely normal, you will receive your results only by: [place] (if you have MyChart) OR A paper copy in the mail If you have any lab test that is abnormal or we need to change your treatment, we will call you to review the results.   Testing/Procedures: Your physician has requested that you have a carotid duplex. This test is an ultrasound of the carotid arteries in your neck. It looks at blood flow through these arteries that supply the brain with blood. Allow one hour for this exam. There are no restrictions or special instructions. This will take place at [place], [address].  Myocardial Amyloid Planar and Spect Scan - Your physician has requested that you have a scan to evaluate for cardiac amyloidosis. This test "outlines" the heart to determine if you have any amyloid protein deposits located in your heart. This will be performed here at our [place] location. The test will take approximately 2 hours to complete. Please do not wear any cologne or fragrances.   Follow-Up: At [facility], you and your health needs are our priority.  As part of our continuing mission to provide you with exceptional heart care, we have created designated Provider Care Teams.  These Care Teams include your primary Cardiologist (physician) and Advanced Practice Providers (APPs -  Physician Assistants and Nurse Practitioners) who all work together to provide you with the care you need, when you need it.  We recommend signing up for the patient portal called "MyChart".  Sign up information  is provided on this After Visit Summary.  MyChart is used to connect with patients for Virtual Visits (Telemedicine).  Patients are able to view lab/test results, encounter notes, upcoming appointments, etc.  Non-urgent messages can be sent to your provider as well.   To learn more about what you can do with MyChart, go to [email or web address].    Your next appointment:   3 month(s)  The format for your next appointment:   In Person  Provider:   You may see Sanda Klein, MD or one of the following Advanced Practice Providers on your designated Care Team:   Almyra Deforest, PA-C Fabian Sharp, [place] or  Roby Lofts, [place]   Other Instructions Please increase the sodium in your diet (liberally) to help with hypotension

## 2020-11-05 ENCOUNTER — Other Ambulatory Visit: Payer: Self-pay | Admitting: *Deleted

## 2020-11-05 LAB — BASIC METABOLIC PANEL
BUN/Creatinine Ratio: 16 (ref 10–24)
BUN: 18 mg/dL (ref 8–27)
CO2: 26 mmol/L (ref 20–29)
Calcium: 9.8 mg/dL (ref 8.6–10.2)
Chloride: 101 mmol/L (ref 96–106)
Creatinine, Ser: 1.15 mg/dL (ref 0.76–1.27)
Glucose: 112 mg/dL — ABNORMAL HIGH (ref 65–99)
Potassium: 5.1 mmol/L (ref 3.5–5.2)
Sodium: 141 mmol/L (ref 134–144)
eGFR: 64 mL/min/{1.73_m2} (ref >59)

## 2020-11-05 LAB — CORTISOL-AM, BLOOD: Cortisol - AM: 5.5 ug/dL — ABNORMAL LOW (ref 6.2–19.4)

## 2020-11-09 ENCOUNTER — Other Ambulatory Visit: Payer: Self-pay | Admitting: *Deleted

## 2020-11-10 ENCOUNTER — Other Ambulatory Visit: Payer: Self-pay

## 2020-11-10 ENCOUNTER — Ambulatory Visit (HOSPITAL_COMMUNITY)
Admission: RE | Admit: 2020-11-10 | Discharge: 2020-11-10 | Disposition: A | Discharge status: Home / Self Care | Payer: Medicare Other | Source: Ambulatory Visit | Attending: Cardiology | Admitting: Cardiology

## 2020-11-10 ENCOUNTER — Other Ambulatory Visit: Payer: Self-pay | Admitting: *Deleted

## 2020-11-10 DIAGNOSIS — R42 Dizziness and giddiness: Secondary | ICD-10-CM | POA: Diagnosis present

## 2020-11-10 DIAGNOSIS — R899 Unspecified abnormal finding in specimens from other organs, systems and tissues: Secondary | ICD-10-CM

## 2020-11-10 DIAGNOSIS — E2749 Other adrenocortical insufficiency: Secondary | ICD-10-CM

## 2020-11-11 ENCOUNTER — Telehealth: Payer: Self-pay | Admitting: *Deleted

## 2020-11-11 ENCOUNTER — Other Ambulatory Visit: Payer: Self-pay | Admitting: *Deleted

## 2020-11-11 DIAGNOSIS — N2889 Other specified disorders of kidney and ureter: Secondary | ICD-10-CM

## 2020-11-11 NOTE — Telephone Encounter (Signed)
Late entry   Spoke to Patient. Patient is aware that the testing - lab (ACTH stimulation 3 point ) can be done tomorrow at Infusion clinic at [facility]  at 8 am.  Dr Sallyanne Kuster  aware.  Dr Sallyanne Kuster has written physician orders and will hand delivery to the clinic.   Patient is aware to be at [place] . RN  made patient aware the appointment was not confirmed due to the no answer from clinic at 4:15 pm. [name] will be at registration 11/12/20 at 8 am  RN order lab ACTH stimulation 3 point ( as outpatient order for [facility]  lab to process)

## 2020-11-11 NOTE — Progress Notes (Signed)
Open error 

## 2020-11-15 ENCOUNTER — Telehealth: Payer: Self-pay | Admitting: Cardiovascular Disease

## 2020-11-15 NOTE — Telephone Encounter (Signed)
Called asking if B/P is checked during study and  informed B/P is checked during test and also during recovery period .Pt's wife was thankful for call ./cy

## 2020-11-15 NOTE — Telephone Encounter (Signed)
Patient's wife called with question concerning up coming appt for patient due to his bp being up and down. Please advise

## 2020-11-17 ENCOUNTER — Other Ambulatory Visit (HOSPITAL_COMMUNITY): Payer: Self-pay | Admitting: *Deleted

## 2020-11-18 ENCOUNTER — Other Ambulatory Visit: Payer: Self-pay

## 2020-11-18 ENCOUNTER — Ambulatory Visit (HOSPITAL_COMMUNITY)
Admission: RE | Admit: 2020-11-18 | Discharge: 2020-11-18 | Disposition: A | Discharge status: Home / Self Care | Payer: Medicare Other | Source: Ambulatory Visit | Attending: Cardiovascular Disease | Admitting: Cardiovascular Disease

## 2020-11-18 ENCOUNTER — Telehealth: Payer: Self-pay | Admitting: Cardiovascular Disease

## 2020-11-18 DIAGNOSIS — E271 Primary adrenocortical insufficiency: Secondary | ICD-10-CM | POA: Diagnosis not present

## 2020-11-18 LAB — ACTH STIMULATION, 3 TIME POINTS
Cortisol, 30 Min: 20.2 ug/dL
Cortisol, 60 Min: 22.6 ug/dL
Cortisol, Base: 6.9 ug/dL

## 2020-11-18 MED ORDER — COSYNTROPIN 0.25 MG IJ SOLR: 0.2500 mg | Freq: Once | INTRAMUSCULAR | Status: AC

## 2020-11-18 MED ORDER — COSYNTROPIN 0.25 MG IJ SOLR
INTRAMUSCULAR | Status: AC
  Administered 2020-11-18: 0.25 mg via INTRAVENOUS
  Filled 2020-11-18: qty 0.25

## 2020-11-18 NOTE — Progress Notes (Signed)
Cortisol levels done exactly at 30 and 60 min post cortrosyn

## 2020-11-18 NOTE — Telephone Encounter (Signed)
Pt's wife is calling to find out why her husband is having the  infusion test today. She was only advised about the test on 12/03/20. She said the nurse explained the test to her this morning but she's not understanding why the patient is needing the infusion

## 2020-11-18 NOTE — Telephone Encounter (Signed)
Spoke with pt and wife, questions regarding blood work today and nuclear testing on the 16th answered.

## 2020-11-18 NOTE — Progress Notes (Signed)
IV started pre-labs done, meds given at 0900

## 2020-11-19 ENCOUNTER — Other Ambulatory Visit: Payer: Self-pay | Admitting: *Deleted

## 2020-11-19 ENCOUNTER — Telehealth: Payer: Self-pay | Admitting: *Deleted

## 2020-11-19 DIAGNOSIS — R42 Dizziness and giddiness: Secondary | ICD-10-CM

## 2020-11-19 MED ORDER — FLUDROCORTISONE ACETATE 0.1 MG PO TABS: ORAL_TABLET | ORAL | 5 refills | Status: DC

## 2020-11-19 NOTE — Telephone Encounter (Signed)
-----   Message from Sanda Klein, MD sent at 11/18/2020 11:25 AM EDT ----- The results are not all back, but this is enough to exclude a diagnosis of adrenal insufficiency. He may still benefit from fludrocortisone to help boost his BP. Please start fludrocortisone 0.1 mg four days a week (Tue, Thu, Sat, Sun) and check BMET in 2 weeks. He can have the labs done on 9/16, when he comes in for the Amyloid scan.

## 2020-11-19 NOTE — Telephone Encounter (Signed)
Patient made aware of results and verbalized understanding.  

## 2020-12-02 ENCOUNTER — Ambulatory Visit: Payer: Medicare Other | Admitting: Cardiovascular Disease

## 2020-12-02 ENCOUNTER — Telehealth (HOSPITAL_COMMUNITY): Payer: Self-pay | Admitting: *Deleted

## 2020-12-02 NOTE — Telephone Encounter (Signed)
Close encounter 

## 2020-12-03 ENCOUNTER — Ambulatory Visit (HOSPITAL_COMMUNITY)
Admission: RE | Admit: 2020-12-03 | Discharge: 2020-12-03 | Disposition: A | Discharge status: Home / Self Care | Payer: Medicare Other | Source: Ambulatory Visit | Attending: Internal Medicine | Admitting: Internal Medicine

## 2020-12-03 ENCOUNTER — Other Ambulatory Visit: Payer: Self-pay

## 2020-12-03 DIAGNOSIS — E8582 Wild-type transthyretin-related (ATTR) amyloidosis: Secondary | ICD-10-CM | POA: Diagnosis present

## 2020-12-03 MED ORDER — TECHNETIUM TC 99M PYROPHOSPHATE
21.8000 | Freq: Once | INTRAVENOUS | Status: AC
  Administered 2020-12-03: 21.8 via INTRAVENOUS

## 2020-12-06 ENCOUNTER — Other Ambulatory Visit: Payer: Self-pay | Admitting: *Deleted

## 2020-12-06 ENCOUNTER — Encounter: Payer: Self-pay | Admitting: *Deleted

## 2020-12-06 DIAGNOSIS — R42 Dizziness and giddiness: Secondary | ICD-10-CM

## 2020-12-29 LAB — BASIC METABOLIC PANEL
BUN/Creatinine Ratio: 15 (ref 10–24)
BUN: 18 mg/dL (ref 8–27)
CO2: 22 mmol/L (ref 20–29)
Calcium: 9.5 mg/dL (ref 8.6–10.2)
Chloride: 103 mmol/L (ref 96–106)
Creatinine, Ser: 1.2 mg/dL (ref 0.76–1.27)
Glucose: 113 mg/dL — ABNORMAL HIGH (ref 70–99)
Potassium: 4.6 mmol/L (ref 3.5–5.2)
Sodium: 139 mmol/L (ref 134–144)
eGFR: 60 mL/min/{1.73_m2} (ref >59)

## 2021-01-22 ENCOUNTER — Observation Stay (HOSPITAL_BASED_OUTPATIENT_CLINIC_OR_DEPARTMENT_OTHER)
Admission: EM | Admit: 2021-01-22 | Discharge: 2021-01-23 | Disposition: A | Discharge status: Home / Self Care | Payer: Medicare Other | Attending: Internal Medicine | Admitting: Internal Medicine

## 2021-01-22 ENCOUNTER — Encounter (HOSPITAL_BASED_OUTPATIENT_CLINIC_OR_DEPARTMENT_OTHER): Payer: Self-pay | Admitting: Emergency Medicine

## 2021-01-22 ENCOUNTER — Emergency Department (HOSPITAL_BASED_OUTPATIENT_CLINIC_OR_DEPARTMENT_OTHER): Payer: Medicare Other

## 2021-01-22 ENCOUNTER — Other Ambulatory Visit: Payer: Self-pay

## 2021-01-22 DIAGNOSIS — J4520 Mild intermittent asthma, uncomplicated: Secondary | ICD-10-CM | POA: Diagnosis present

## 2021-01-22 DIAGNOSIS — Z87891 Personal history of nicotine dependence: Secondary | ICD-10-CM | POA: Diagnosis not present

## 2021-01-22 DIAGNOSIS — Z20822 Contact with and (suspected) exposure to covid-19: Secondary | ICD-10-CM | POA: Diagnosis not present

## 2021-01-22 DIAGNOSIS — Z8546 Personal history of malignant neoplasm of prostate: Secondary | ICD-10-CM | POA: Insufficient documentation

## 2021-01-22 DIAGNOSIS — I2510 Atherosclerotic heart disease of native coronary artery without angina pectoris: Secondary | ICD-10-CM | POA: Insufficient documentation

## 2021-01-22 DIAGNOSIS — I10 Essential (primary) hypertension: Secondary | ICD-10-CM | POA: Diagnosis not present

## 2021-01-22 DIAGNOSIS — K219 Gastro-esophageal reflux disease without esophagitis: Secondary | ICD-10-CM | POA: Diagnosis present

## 2021-01-22 DIAGNOSIS — J189 Pneumonia, unspecified organism: Secondary | ICD-10-CM | POA: Diagnosis not present

## 2021-01-22 DIAGNOSIS — Z79899 Other long term (current) drug therapy: Secondary | ICD-10-CM | POA: Insufficient documentation

## 2021-01-22 DIAGNOSIS — I700 Atherosclerosis of aorta: Secondary | ICD-10-CM | POA: Diagnosis present

## 2021-01-22 DIAGNOSIS — J9621 Acute and chronic respiratory failure with hypoxia: Secondary | ICD-10-CM | POA: Diagnosis not present

## 2021-01-22 DIAGNOSIS — Z7901 Long term (current) use of anticoagulants: Secondary | ICD-10-CM | POA: Diagnosis not present

## 2021-01-22 DIAGNOSIS — D509 Iron deficiency anemia, unspecified: Secondary | ICD-10-CM | POA: Diagnosis present

## 2021-01-22 DIAGNOSIS — R0602 Shortness of breath: Secondary | ICD-10-CM | POA: Diagnosis present

## 2021-01-22 DIAGNOSIS — I480 Paroxysmal atrial fibrillation: Secondary | ICD-10-CM | POA: Diagnosis present

## 2021-01-22 LAB — COMPREHENSIVE METABOLIC PANEL
ALT: 13 U/L (ref 0–44)
AST: 21 U/L (ref 15–41)
Albumin: 4.1 g/dL (ref 3.5–5.0)
Alkaline Phosphatase: 70 U/L (ref 38–126)
Anion gap: 11 (ref 5–15)
BUN: 21 mg/dL (ref 8–23)
CO2: 22 mmol/L (ref 22–32)
Calcium: 9.1 mg/dL (ref 8.9–10.3)
Chloride: 105 mmol/L (ref 98–111)
Creatinine, Ser: 1.18 mg/dL (ref 0.61–1.24)
GFR, Estimated: >60 mL/min (ref >60)
Glucose, Bld: 111 mg/dL — ABNORMAL HIGH (ref 70–99)
Potassium: 4.1 mmol/L (ref 3.5–5.1)
Sodium: 138 mmol/L (ref 135–145)
Total Bilirubin: 0.4 mg/dL (ref 0.3–1.2)
Total Protein: 7 g/dL (ref 6.5–8.1)

## 2021-01-22 LAB — CBC WITH DIFFERENTIAL/PLATELET
Abs Immature Granulocytes: 0.01 10*3/uL (ref 0.00–0.07)
Basophils Absolute: 0 10*3/uL (ref 0.0–0.1)
Basophils Relative: 0 %
Eosinophils Absolute: 0.1 10*3/uL (ref 0.0–0.5)
Eosinophils Relative: 2 %
HCT: 35.3 % — ABNORMAL LOW (ref 39.0–52.0)
Hemoglobin: 12.2 g/dL — ABNORMAL LOW (ref 13.0–17.0)
Immature Granulocytes: 0 %
Lymphocytes Relative: 10 %
Lymphs Abs: 0.8 10*3/uL (ref 0.7–4.0)
MCH: 31.4 pg (ref 26.0–34.0)
MCHC: 34.6 g/dL (ref 30.0–36.0)
MCV: 90.7 fL (ref 80.0–100.0)
Monocytes Absolute: 0.4 10*3/uL (ref 0.1–1.0)
Monocytes Relative: 5 %
Neutro Abs: 6.2 10*3/uL (ref 1.7–7.7)
Neutrophils Relative %: 83 %
Platelets: 166 10*3/uL (ref 150–400)
RBC: 3.89 MIL/uL — ABNORMAL LOW (ref 4.22–5.81)
RDW: 14.2 % (ref 11.5–15.5)
WBC: 7.5 10*3/uL (ref 4.0–10.5)
nRBC: 0 % (ref 0.0–0.2)

## 2021-01-22 LAB — PHOSPHORUS: Phosphorus: 3.2 mg/dL (ref 2.5–4.6)

## 2021-01-22 LAB — MAGNESIUM: Magnesium: 2 mg/dL (ref 1.7–2.4)

## 2021-01-22 LAB — LACTIC ACID, PLASMA
Lactic Acid, Venous: 1.6 mmol/L (ref 0.5–1.9)
Lactic Acid, Venous: 1.5 mmol/L (ref 0.5–1.9)

## 2021-01-22 LAB — TROPONIN I (HIGH SENSITIVITY)
Troponin I (High Sensitivity): 3 ng/L (ref <18)
Troponin I (High Sensitivity): 5 ng/L (ref <18)

## 2021-01-22 LAB — BRAIN NATRIURETIC PEPTIDE: B Natriuretic Peptide: 46.1 pg/mL (ref 0.0–100.0)

## 2021-01-22 LAB — RESP PANEL BY RT-PCR (FLU A&B, COVID) ARPGX2
Influenza A by PCR: NEGATIVE
Influenza B by PCR: NEGATIVE
SARS Coronavirus 2 by RT PCR: NEGATIVE

## 2021-01-22 MED ORDER — ONDANSETRON HCL 4 MG/2ML IJ SOLN: 4.0000 mg | Freq: Four times a day (QID) | INTRAMUSCULAR | Status: DC | PRN

## 2021-01-22 MED ORDER — BRIMONIDINE TARTRATE 0.2 % OP SOLN
1.0000 [drp] | Freq: Three times a day (TID) | OPHTHALMIC | Status: DC
  Administered 2021-01-22 – 2021-01-23 (×3): 1 [drp] via OPHTHALMIC
  Filled 2021-01-22: qty 5

## 2021-01-22 MED ORDER — LACTATED RINGERS IV SOLN: INTRAVENOUS | Status: AC

## 2021-01-22 MED ORDER — ONDANSETRON HCL 4 MG PO TABS: 4.0000 mg | ORAL_TABLET | Freq: Four times a day (QID) | ORAL | Status: DC | PRN

## 2021-01-22 MED ORDER — SODIUM CHLORIDE 0.9 % IV SOLN
2.0000 g | INTRAVENOUS | Status: AC
  Administered 2021-01-22: 2 g via INTRAVENOUS
  Filled 2021-01-22: qty 2

## 2021-01-22 MED ORDER — SODIUM CHLORIDE 0.9 % IV SOLN
500.0000 mg | INTRAVENOUS | Status: DC
  Administered 2021-01-22 – 2021-01-23 (×2): 500 mg via INTRAVENOUS
  Filled 2021-01-22 (×3): qty 500

## 2021-01-22 MED ORDER — ALFUZOSIN HCL ER 10 MG PO TB24
10.0000 mg | ORAL_TABLET | Freq: Every day | ORAL | Status: DC
  Administered 2021-01-22 – 2021-01-23 (×2): 10 mg via ORAL
  Filled 2021-01-22 (×2): qty 1

## 2021-01-22 MED ORDER — PANTOPRAZOLE SODIUM 40 MG PO TBEC
40.0000 mg | DELAYED_RELEASE_TABLET | Freq: Every day | ORAL | Status: DC
  Administered 2021-01-23: 40 mg via ORAL
  Filled 2021-01-22: qty 1

## 2021-01-22 MED ORDER — PANCRELIPASE (LIP-PROT-AMYL) 12000-38000 UNITS PO CPEP: 36000.0000 [IU] | ORAL_CAPSULE | Freq: Every day | ORAL | Status: DC

## 2021-01-22 MED ORDER — ATORVASTATIN CALCIUM 10 MG PO TABS
10.0000 mg | ORAL_TABLET | Freq: Every morning | ORAL | Status: DC
  Administered 2021-01-23: 10 mg via ORAL
  Filled 2021-01-22: qty 1

## 2021-01-22 MED ORDER — FLUDROCORTISONE ACETATE 0.1 MG PO TABS
0.1000 mg | ORAL_TABLET | ORAL | Status: DC
  Administered 2021-01-23: 0.1 mg via ORAL
  Filled 2021-01-22: qty 1

## 2021-01-22 MED ORDER — SODIUM CHLORIDE 0.9 % IV SOLN: INTRAVENOUS | Status: DC

## 2021-01-22 MED ORDER — LACTATED RINGERS IV BOLUS
1000.0000 mL | Freq: Once | INTRAVENOUS | Status: AC
  Administered 2021-01-22: 1000 mL via INTRAVENOUS

## 2021-01-22 MED ORDER — VANCOMYCIN HCL IN DEXTROSE 1-5 GM/200ML-% IV SOLN
1000.0000 mg | Freq: Once | INTRAVENOUS | Status: AC
  Administered 2021-01-22: 1000 mg via INTRAVENOUS
  Filled 2021-01-22: qty 200

## 2021-01-22 MED ORDER — METOPROLOL SUCCINATE ER 25 MG PO TB24
12.5000 mg | ORAL_TABLET | Freq: Every day | ORAL | Status: DC
  Administered 2021-01-22 – 2021-01-23 (×2): 12.5 mg via ORAL
  Filled 2021-01-22 (×2): qty 1

## 2021-01-22 MED ORDER — ACETAMINOPHEN 325 MG PO TABS
650.0000 mg | ORAL_TABLET | Freq: Four times a day (QID) | ORAL | Status: DC | PRN
  Administered 2021-01-22: 650 mg via ORAL
  Filled 2021-01-22: qty 2

## 2021-01-22 MED ORDER — APIXABAN 5 MG PO TABS
5.0000 mg | ORAL_TABLET | Freq: Two times a day (BID) | ORAL | Status: DC
  Administered 2021-01-22 – 2021-01-23 (×2): 5 mg via ORAL
  Filled 2021-01-22 (×2): qty 1

## 2021-01-22 MED ORDER — SODIUM CHLORIDE 0.9 % IV SOLN
2.0000 g | INTRAVENOUS | Status: DC
  Administered 2021-01-22 – 2021-01-23 (×2): 2 g via INTRAVENOUS
  Filled 2021-01-22 (×3): qty 20

## 2021-01-22 MED ORDER — METHOCARBAMOL 500 MG PO TABS
500.0000 mg | ORAL_TABLET | Freq: Every evening | ORAL | Status: DC | PRN
  Administered 2021-01-22: 500 mg via ORAL
  Filled 2021-01-22: qty 1

## 2021-01-22 MED ORDER — IOHEXOL 350 MG/ML SOLN
100.0000 mL | Freq: Once | INTRAVENOUS | Status: AC | PRN
  Administered 2021-01-22: 100 mL via INTRAVENOUS

## 2021-01-22 MED ORDER — ACETAMINOPHEN 650 MG RE SUPP: 650.0000 mg | Freq: Four times a day (QID) | RECTAL | Status: DC | PRN

## 2021-01-22 NOTE — H&P (Signed)
History and Physical    John Livingston RCV:[id number] DOB: [birth year] DOA: 01/22/2021  PCP: Milana Na., MD   Patient coming from: Home.   I have personally briefly reviewed patient's old medical records in [place]  Chief Complaint: Shortness of breath.  HPI: John Livingston is a 80 y.o. male with medical history significant of Baker's cyst of the left knee, appendix cancer, GERD, lithiasis, hypertension, mild asthma, prostate cancer, aortic atherosclerosis, CAD, who came into the emergency department due to dyspnea, wheezing, chills, fever, rigors following 2-week history of a sinus infection.  He stated that he he was getting better but then developed the symptoms.  He has also been having frontal headache.  He denied chest pain, palpitations, diaphoresis, PND, orthopnea or pitting edema of the lower extremities.  No abdominal pain, appetite changes, nausea, emesis, diarrhea, constipation, melena or hematochezia.  Complained of nocturia, but denied dysuria, flank pain, frequency or hematuria.  No polyuria, polydipsia, polyphagia or blurred vision.  ED Course: Initial vital signs were temperature 99.5 F, pulse 98, respirations 23, BP 150/67 mmHg O2 sat 88 to 90% on room air.  The patient received supplemental oxygen, cefepime and vancomycin.    Lab work: CBC is her white count 7.5 with 82% neutrophils, hemoglobin 12.2 g/dL and platelets 166.  CMP was unremarkable except for glucose of 111 mg/dL.  Lactic acid x2, troponin x2 and BNP were normal.  Coronavirus and inflames a PCR was negative.  Imaging: Portable chest radiograph showed patchy medial right lung base opacity suspicious for pneumonia.  CTA PE protocol was negative for embolism but showed patchy peribronchovascular consolidation and groundglass opacity throughout the right lung, most prominent in the right middle lobe compatible with multilobar pneumonia.  Review of Systems: As per HPI otherwise all other systems  reviewed and are negative.  Past Medical History:  Diagnosis Date   Baker's cyst of knee    left   Cancer of appendix ([place])    GERD (gastroesophageal reflux disease)    History of kidney stones    Hypertension    Moderate persistent asthma    pt. denies   Prostate cancer ([place])    Ureteral stone    Past Surgical History:  Procedure Laterality Date   APPENDECTOMY     cartilage surgery in nose     CHOLECYSTECTOMY N/A 03/06/2019   Procedure: LAPAROSCOPIC CHOLECYSTECTOMY;  Surgeon: Coralie Keens, MD;  Location: [facility];  Service: General;  Laterality: N/A;   IR EXCHANGE BILIARY DRAIN  02/21/2019   IR PERC CHOLECYSTOSTOMY  01/17/2019   IR RADIOLOGIST EVAL & MGMT  02/19/2019   Social History  reports that he quit smoking about 42 years ago. His smoking use included cigarettes. He has a 20.00 pack-year smoking history. He has never used smokeless tobacco. He reports that he does not currently use alcohol after a past usage of about 1.0 standard drink per week. He reports that he does not use drugs.  No Known Allergies  Family History  Problem Relation Age of Onset   Asthma Son        as a child   Heart disease Mother    Prostate cancer Father    Medication Sig Start Date End Date Taking? Authorizing Provider  albuterol (PROVENTIL HFA;VENTOLIN HFA) 108 (90 BASE) MCG/ACT inhaler Inhale 2 puffs into the lungs every 6 (six) hours as needed for wheezing or shortness of breath. Patient not taking: Reported on 01/22/2021 04/18/12   Elsie Stain, MD  alfuzosin (UROXATRAL) 10 MG 24 hr tablet Take 1 tablet (10 mg total) by mouth daily with supper. 11/04/20   Croitoru, Mihai, MD  atorvastatin (LIPITOR) 10 MG tablet TAKE 1 TABLET BY MOUTH EVERY DAY 07/02/20   Croitoru, Mihai, MD  brimonidine (ALPHAGAN) 0.2 % ophthalmic solution INSTILL 1 DROP INTO EACH EYE THREE TIMES DAILY 02/28/19   [provider]  Doxylamine Succinate, Sleep, (SLEEP AID PO) Take 2 tablets by mouth at bedtime.     [provider]  ELIQUIS 5 MG TABS tablet TAKE 1 TABLET BY MOUTH TWICE A DAY 08/17/20   Croitoru, Mihai, MD  fludrocortisone (FLORINEF) 0.1 MG tablet Take one tablet 4 days a week (Tuesday, Thursday, Saturday and Sunday) 11/19/20   Croitoru, Mihai, MD  lipase/protease/amylase (CREON) 12000-38000 units CPEP capsule Take by mouth. Pt isn't sure of dosage    [provider]  metoprolol succinate (TOPROL-XL) 25 MG 24 hr tablet Take 0.5 tablets (12.5 mg total) by mouth daily. Take with or immediately following a meal. May take an extra half a tablet (12.5 mg) as needed once daily for palpitations. 11/04/20 02/02/21  Croitoru, Mihai, MD  omeprazole (PRILOSEC) 20 MG capsule Take 20 mg by mouth daily.    [provider]   Physical Exam: Vitals:   01/22/21 1345 01/22/21 1407 01/22/21 1530 01/22/21 1534  BP:  (!) 104/50  133/60  Pulse: 98 89  83  Resp: 18 19 16 18   Temp:    99.3 F (37.4 C)  TempSrc:   Oral Oral  SpO2: 95% 95%  97%  Weight:   89.8 kg   Height:   5' 9.5" (1.765 m)    Constitutional: NAD, calm, comfortable Eyes: PERRL, lids and conjunctivae normal ENMT: Mucous membranes are moist. Posterior pharynx clear of any exudate or lesions. Neck: normal, supple, no masses, no thyromegaly Respiratory: Bibasilar crackles more pronounced on the right lower lobe. Normal respiratory effort. No accessory muscle use.  Cardiovascular: Regular rate and rhythm, no murmurs / rubs / gallops. No extremity edema. 2+ pedal pulses. No carotid bruits.  Abdomen: Obese, no distention.  Bowel sounds positive.  Soft, no tenderness, no masses palpated. No hepatosplenomegaly. Bowel sounds positive.  Musculoskeletal: no clubbing / cyanosis. Good ROM, no contractures. Normal muscle tone.  Skin: no acute rashes, lesions, ulcers on limited dermatological semination. Neurologic: CN 2-12 grossly intact. Sensation intact, DTR normal. Strength 5/5 in all 4.  Psychiatric: Normal judgment and insight.  Alert and oriented x 3. Normal mood.   Labs on Admission: I have personally reviewed following labs and imaging studies  CBC: Recent Labs  Lab 01/22/21 0404  WBC 7.5  NEUTROABS 6.2  HGB 12.2*  HCT 35.3*  MCV 90.7  PLT 099    Basic Metabolic Panel: Recent Labs  Lab 01/22/21 0404  NA 138  K 4.1  CL 105  CO2 22  GLUCOSE 111*  BUN 21  CREATININE 1.18  CALCIUM 9.1    GFR: Estimated Creatinine Clearance: 54 mL/min (by C-G formula based on SCr of 1.18 mg/dL).  Liver Function Tests: Recent Labs  Lab 01/22/21 0404  AST 21  ALT 13  ALKPHOS 70  BILITOT 0.4  PROT 7.0  ALBUMIN 4.1   Radiological Exams on Admission: CT Angio Chest PE W and/or Wo Contrast  Result Date: 01/22/2021 CLINICAL DATA:  Dyspnea, chills, she of ring EXAM: CT ANGIOGRAPHY CHEST WITH CONTRAST TECHNIQUE: Multidetector CT imaging of the chest was performed using the standard protocol during bolus administration of  intravenous contrast. Multiplanar CT image reconstructions and MIPs were obtained to evaluate the vascular anatomy. CONTRAST:  134mL OMNIPAQUE IOHEXOL 350 MG/ML SOLN COMPARISON:  Chest radiograph from earlier today. 03/29/2015 chest CT angiogram. FINDINGS: Cardiovascular: The study is high quality for the evaluation of pulmonary embolism. There are no filling defects in the central, lobar, segmental or subsegmental pulmonary artery branches to suggest acute pulmonary embolism. Atherosclerotic nonaneurysmal thoracic aorta. Normal caliber pulmonary arteries. Normal heart size. No significant pericardial fluid/thickening. Left anterior descending coronary atherosclerosis. Mediastinum/Nodes: No discrete thyroid nodules. Unremarkable esophagus. No pathologically enlarged axillary, mediastinal or hilar lymph nodes. Lungs/Pleura: No pneumothorax. No pleural effusion. Patchy peribronchovascular consolidation and ground-glass opacity throughout the right lung, most prominent in the right middle lobe. Scattered  perilobular lines in the peripheral left lung compatible with nonspecific mild postinfectious scarring. No lung masses or significant pulmonary nodules. Upper abdomen: Cholecystectomy. Mild diffuse colonic diverticulosis. Musculoskeletal: No aggressive appearing focal osseous lesions. Moderate thoracic spondylosis. Review of the MIP images confirms the above findings. IMPRESSION: 1. No pulmonary embolism. 2. Patchy peribronchovascular consolidation and ground-glass opacity throughout the right lung, most prominent in the right middle lobe, compatible with multilobar pneumonia. Recommend follow-up PA and lateral post treatment chest radiographs in 4-6 weeks. 3. One vessel coronary atherosclerosis. 4. Aortic Atherosclerosis (ICD10-I70.0). Electronically Signed   By: Ilona Sorrel M.D.   On: 01/22/2021 06:12   DG Chest Portable 1 View  Result Date: 01/22/2021 CLINICAL DATA:  [place] EXAM: PORTABLE CHEST 1 VIEW COMPARISON:  07/18/2020 chest radiograph. FINDINGS: Stable cardiomediastinal silhouette with normal heart size. No pneumothorax. No pleural effusion. Patchy medial right lung base opacity. IMPRESSION: Patchy medial right lung base opacity, suspicious for pneumonia. Recommend follow-up PA and lateral post treatment chest radiographs in 4-6 weeks. Electronically Signed   By: Ilona Sorrel M.D.   On: 01/22/2021 05:16    02/24/2019 echocardiogram IMPRESSIONS    1. Left ventricular ejection fraction, by visual estimation, is 55 to  60%. The left ventricle has normal function. There is mildly increased  left ventricular hypertrophy.   2. Left ventricular diastolic parameters are consistent with Grade I  diastolic dysfunction (impaired relaxation).   3. The left ventricle has no regional wall motion abnormalities.   4. Global right ventricle has normal systolic function.The right  ventricular size is normal. No increase in right ventricular wall  thickness.   5. Left atrial size was normal.   6. Right  atrial size was normal.   7. The mitral valve is normal in structure. No evidence of mitral valve  regurgitation. No evidence of mitral stenosis.   8. The tricuspid valve is normal in structure. Tricuspid valve  regurgitation is not demonstrated.   9. The aortic valve is tricuspid. Aortic valve regurgitation is not  visualized. Mild aortic valve sclerosis without stenosis.  10. There is mild dilatation of the aortic root measuring 40 mm.  11. TR signal is inadequate for assessing pulmonary artery systolic  pressure.   EKG: Independently reviewed.  Vent. rate 97 BPM PR interval 190 ms QRS duration 93 ms QT/QTcB 352/448 ms P-R-T axes 78 89 58 Sinus rhythm Atrial premature complex  Assessment/Plan Principal Problem:   Acute on chronic respiratory failure with hypoxia (HCC) In the setting of   CAP (community acquired pneumonia) Admit to PCU/inpatient. Continue supplemental oxygen.   Continue bronchodilators as needed. Continue azithromycin 500 mg IV daily. Continue ceftriaxone 1 g IV daily. Check strep pneumonia urinary antigen.   Follow-up blood culture and sensitivity.  Active Problems:   Mild intermittent asthma Continue pneumonia treatment. Bronchodilators as needed.    GERD (gastroesophageal reflux disease) Continue PPI.    AF (paroxysmal atrial fibrillation) (HCC) CHA?DS?-VASc Score of at least 5. Continue Eliquis and metoprolol.    Hypertension Continue metoprolol 12.5 mg p.o. daily.    Aortic atherosclerosis (HCC) Continue atorvastatin 10 mg p.o. daily.    CAD (coronary artery disease) Continue Eliquis, atorvastatin and metoprolol. Follow-up with cardiology.    Microcytic anemia Monitor H&H. Follow-up transfuse as needed.    DVT prophylaxis: On apixaban. Code Status:   Lovenox SQ. Family Communication:   Disposition Plan:   Patient is from:  Home.  Anticipated DC to:  Home.  Anticipated DC date:  01/24/2021 or 01/25/2021.  Anticipated DC  barriers: Clinical status.  Consults called:   Admission status:  Inpatient/PCU.   Severity of Illness: High severity after presenting with hypoxia, dyspnea, fever and chills in the setting of community-acquired pneumonia.  The patient will remain in the hospital for 48 to 72 hours for IV antibiotic therapy.  Reubin Milan MD [facility]  How to contact the [facility] Attending or Consulting provider [place] or covering provider during after hours [place], for this patient?   Check the care team in [facility] and look for a) attending/consulting [facility] provider listed and b) the [facility] team listed Log into [email or web address] and use [facility]'s universal password to access. If you do not have the password, please contact the hospital operator. Locate the [facility] provider you are looking for under [facility] and page to a number that you can be directly reached. If you still have difficulty reaching the provider, please page the [facility] (Director on Call) for the Hospitalists listed on amion for assistance.  01/22/2021, 4:22 PM   This document was prepared in Dragon voice recognition software and may contain some unintended transcription errors.

## 2021-01-22 NOTE — ED Provider Notes (Addendum)
[facility] [place] EMERGENCY DEPARTMENT Provider Note   CSN: [id number] Arrival date & time: 01/22/21  0331     History Chief Complaint  Patient presents with   Shortness of Breath   Chills    John Livingston is a 80 y.o. male.  The history is provided by the patient.  Shortness of Breath Severity:  Moderate Onset quality:  Sudden Duration: hours. Timing:  Constant Progression:  Unchanged Chronicity:  New Context: URI   Context comment:  URI with congestion and PND last week Relieved by:  Nothing Worsened by:  Nothing Ineffective treatments:  None tried Associated symptoms: cough and vomiting   Associated symptoms: no chest pain, no fever, no headaches and no PND   Associated symptoms comment:  Shaking chills Risk factors: no hx of PE/DVT   Patient with h/o AFIb who is not vaccinated for PNA, flu, or covid as he does not believe in it who reportedly had had a "sinus infection" last week with pain and post nasal drainage presents with cough and SOB and hypoxia and one episode of emesis.  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      Past Medical History:  Diagnosis Date   Baker's cyst of knee    left   Cancer of appendix ([place])    GERD (gastroesophageal reflux disease)    History of kidney stones    Hypertension    Moderate persistent asthma    pt. denies   Prostate cancer [facility])    Ureteral stone     Patient Active Problem List   Diagnosis Date Noted   Acute cholecystitis 01/15/2019   AF (paroxysmal atrial fibrillation) ([place]) 01/15/2019   Prostate cancer ([place]) 01/15/2019   Mild intermittent asthma 01/15/2019   Hypertension 01/15/2019   AKI (acute kidney injury) ([place]) 01/15/2019   Hypokalemia 01/15/2019   Preoperative cardiovascular examination    Moderate persistent asthma with reflux disease 04/18/2012   Dysphagia 04/18/2012   GERD (gastroesophageal reflux disease) 04/18/2012    Past Surgical History:   Procedure Laterality Date   APPENDECTOMY     cartilage surgery in nose     CHOLECYSTECTOMY N/A 03/06/2019   Procedure: LAPAROSCOPIC CHOLECYSTECTOMY;  Surgeon: Coralie Keens, MD;  Location: [facility];  Service: General;  Laterality: N/A;   IR EXCHANGE BILIARY DRAIN  02/21/2019   IR PERC CHOLECYSTOSTOMY  01/17/2019   IR RADIOLOGIST EVAL & MGMT  02/19/2019       Family History  Problem Relation Age of Onset   Asthma Son        as a child   Heart disease Mother    Prostate cancer Father     Social History   Tobacco Use   Smoking status: Former    Packs/day: 1.00    Years: 20.00    Pack years: 20.00    Types: Cigarettes    Quit date: 03/20/1978    Years since quitting: 42.8   Smokeless tobacco: Never  Vaping Use   Vaping Use: Never used  Substance Use Topics   Alcohol use: Not Currently    Alcohol/week: 1.0 standard drink    Types: 1 Cans of beer per week   Drug use: No    Home Medications Prior to Admission medications   Medication Sig Start Date End Date Taking? Authorizing Provider  acetaminophen (TYLENOL) 650 MG CR tablet Take 1,300 mg by mouth every 8 (eight) hours as needed for pain.  [provider]  albuterol (PROVENTIL HFA;VENTOLIN HFA) 108 (90 BASE) MCG/ACT inhaler Inhale 2 puffs into the lungs every 6 (six) hours as needed for wheezing or shortness of breath. 04/18/12   Elsie Stain, MD  alfuzosin (UROXATRAL) 10 MG 24 hr tablet Take 1 tablet (10 mg total) by mouth daily with supper. 11/04/20   Croitoru, Mihai, MD  atorvastatin (LIPITOR) 10 MG tablet TAKE 1 TABLET BY MOUTH EVERY DAY 07/02/20   Croitoru, Mihai, MD  brimonidine (ALPHAGAN) 0.2 % ophthalmic solution INSTILL 1 DROP INTO EACH EYE THREE TIMES DAILY 02/28/19   [provider]  Doxylamine Succinate, Sleep, (SLEEP AID PO) Take 2 tablets by mouth at bedtime.    [provider]  ELIQUIS 5 MG TABS tablet TAKE 1 TABLET BY MOUTH TWICE A DAY 08/17/20   Croitoru, Mihai, MD   fludrocortisone (FLORINEF) 0.1 MG tablet Take one tablet 4 days a week (Tuesday, Thursday, Saturday and Sunday) 11/19/20   Croitoru, Mihai, MD  lipase/protease/amylase (CREON) 12000-38000 units CPEP capsule Take by mouth. Pt isn't sure of dosage    [provider]  metoprolol succinate (TOPROL-XL) 25 MG 24 hr tablet Take 0.5 tablets (12.5 mg total) by mouth daily. Take with or immediately following a meal. May take an extra half a tablet (12.5 mg) as needed once daily for palpitations. 11/04/20 02/02/21  Croitoru, Mihai, MD  omeprazole (PRILOSEC) 20 MG capsule Take 20 mg by mouth daily.    [provider]    Allergies    Patient has no known allergies.  Review of Systems   Review of Systems  Constitutional:  Negative for fever.  Eyes:  Negative for redness.  Respiratory:  Positive for cough and shortness of breath.   Cardiovascular:  Negative for chest pain and PND.  Gastrointestinal:  Positive for vomiting.  Genitourinary:  Negative for difficulty urinating.  Neurological:  Negative for headaches.  Psychiatric/Behavioral:  Negative for agitation.   All other systems reviewed and are negative.  Physical Exam Updated Vital Signs BP (!) 142/63   Pulse (!) 108   Temp 100.2 F (37.9 C) (Axillary)   Resp 19   Wt 88.9 kg   SpO2 98%   BMI 28.94 kg/m   Physical Exam Vitals and nursing note reviewed.  Constitutional:      Appearance: Normal appearance.  HENT:     Head: Normocephalic and atraumatic.     Nose: Nose normal.  Eyes:     Conjunctiva/sclera: Conjunctivae normal.     Pupils: Pupils are equal, round, and reactive to light.  Cardiovascular:     Rate and Rhythm: Normal rate and regular rhythm.     Pulses: Normal pulses.     Heart sounds: Normal heart sounds.  Pulmonary:     Effort: Tachypnea present.     Breath sounds: Rales present.  Abdominal:     General: Abdomen is flat. Bowel sounds are normal.     Palpations: Abdomen is soft.     Tenderness:  There is no abdominal tenderness. There is no guarding.  Musculoskeletal:        General: Normal range of motion.     Cervical back: Normal range of motion and neck supple.     Right lower leg: No tenderness.     Left lower leg: No tenderness. No edema.  Skin:    General: Skin is warm and dry.     Capillary Refill: Capillary refill takes less than 2 seconds.  Neurological:  General: No focal deficit present.     Mental Status: He is alert.     Deep Tendon Reflexes: Reflexes normal.  Psychiatric:        Mood and Affect: Mood normal.        Behavior: Behavior normal.    ED Results / Procedures / Treatments   Labs (all labs ordered are listed, but only abnormal results are displayed) Results for orders placed or performed during the hospital encounter of 01/22/21  Resp Panel by RT-PCR (Flu A&B, Covid) Nasopharyngeal Swab   Specimen: Nasopharyngeal Swab; Nasopharyngeal(NP) swabs in vial transport medium  Result Value Ref Range   SARS Coronavirus 2 by RT PCR NEGATIVE NEGATIVE   Influenza A by PCR NEGATIVE NEGATIVE   Influenza B by PCR NEGATIVE NEGATIVE  CBC with Differential/Platelet  Result Value Ref Range   WBC 7.5 4.0 - 10.5 K/uL   RBC 3.89 (L) 4.22 - 5.81 MIL/uL   Hemoglobin 12.2 (L) 13.0 - 17.0 g/dL   HCT 35.3 (L) 39.0 - 52.0 %   MCV 90.7 80.0 - 100.0 fL   MCH 31.4 26.0 - 34.0 pg   MCHC 34.6 30.0 - 36.0 g/dL   RDW 14.2 11.5 - 15.5 %   Platelets 166 150 - 400 K/uL   nRBC 0.0 0.0 - 0.2 %   Neutrophils Relative % 83 %   Neutro Abs 6.2 1.7 - 7.7 K/uL   Lymphocytes Relative 10 %   Lymphs Abs 0.8 0.7 - 4.0 K/uL   Monocytes Relative 5 %   Monocytes Absolute 0.4 0.1 - 1.0 K/uL   Eosinophils Relative 2 %   Eosinophils Absolute 0.1 0.0 - 0.5 K/uL   Basophils Relative 0 %   Basophils Absolute 0.0 0.0 - 0.1 K/uL   Immature Granulocytes 0 %   Abs Immature Granulocytes 0.01 0.00 - 0.07 K/uL  Comprehensive metabolic panel  Result Value Ref Range   Sodium 138 135 - 145 mmol/L    Potassium 4.1 3.5 - 5.1 mmol/L   Chloride 105 98 - 111 mmol/L   CO2 22 22 - 32 mmol/L   Glucose, Bld 111 (H) 70 - 99 mg/dL   BUN 21 8 - 23 mg/dL   Creatinine, Ser 1.18 0.61 - 1.24 mg/dL   Calcium 9.1 8.9 - 10.3 mg/dL   Total Protein 7.0 6.5 - 8.1 g/dL   Albumin 4.1 3.5 - 5.0 g/dL   AST 21 15 - 41 U/L   ALT 13 0 - 44 U/L   Alkaline Phosphatase 70 38 - 126 U/L   Total Bilirubin 0.4 0.3 - 1.2 mg/dL   GFR, Estimated >60 >60 mL/min   Anion gap 11 5 - 15  Brain natriuretic peptide  Result Value Ref Range   B Natriuretic Peptide 46.1 0.0 - 100.0 pg/mL  Lactic acid, plasma  Result Value Ref Range   Lactic Acid, Venous 1.6 0.5 - 1.9 mmol/L  Troponin I (High Sensitivity)  Result Value Ref Range   Troponin I (High Sensitivity) 3 <18 ng/L   CT Angio Chest PE W and/or Wo Contrast  Result Date: 01/22/2021 CLINICAL DATA:  Dyspnea, chills, she of ring EXAM: CT ANGIOGRAPHY CHEST WITH CONTRAST TECHNIQUE: Multidetector CT imaging of the chest was performed using the standard protocol during bolus administration of intravenous contrast. Multiplanar CT image reconstructions and MIPs were obtained to evaluate the vascular anatomy. CONTRAST:  172mL OMNIPAQUE IOHEXOL 350 MG/ML SOLN COMPARISON:  Chest radiograph from earlier today. 03/29/2015 chest CT angiogram. FINDINGS: Cardiovascular: The  study is high quality for the evaluation of pulmonary embolism. There are no filling defects in the central, lobar, segmental or subsegmental pulmonary artery branches to suggest acute pulmonary embolism. Atherosclerotic nonaneurysmal thoracic aorta. Normal caliber pulmonary arteries. Normal heart size. No significant pericardial fluid/thickening. Left anterior descending coronary atherosclerosis. Mediastinum/Nodes: No discrete thyroid nodules. Unremarkable esophagus. No pathologically enlarged axillary, mediastinal or hilar lymph nodes. Lungs/Pleura: No pneumothorax. No pleural effusion. Patchy peribronchovascular  consolidation and ground-glass opacity throughout the right lung, most prominent in the right middle lobe. Scattered perilobular lines in the peripheral left lung compatible with nonspecific mild postinfectious scarring. No lung masses or significant pulmonary nodules. Upper abdomen: Cholecystectomy. Mild diffuse colonic diverticulosis. Musculoskeletal: No aggressive appearing focal osseous lesions. Moderate thoracic spondylosis. Review of the MIP images confirms the above findings. IMPRESSION: 1. No pulmonary embolism. 2. Patchy peribronchovascular consolidation and ground-glass opacity throughout the right lung, most prominent in the right middle lobe, compatible with multilobar pneumonia. Recommend follow-up PA and lateral post treatment chest radiographs in 4-6 weeks. 3. One vessel coronary atherosclerosis. 4. Aortic Atherosclerosis (ICD10-I70.0). Electronically Signed   By: Ilona Sorrel M.D.   On: 01/22/2021 06:12   DG Chest Portable 1 View  Result Date: 01/22/2021 CLINICAL DATA:  Chills EXAM: PORTABLE CHEST 1 VIEW COMPARISON:  07/18/2020 chest radiograph. FINDINGS: Stable cardiomediastinal silhouette with normal heart size. No pneumothorax. No pleural effusion. Patchy medial right lung base opacity. IMPRESSION: Patchy medial right lung base opacity, suspicious for pneumonia. Recommend follow-up PA and lateral post treatment chest radiographs in 4-6 weeks. Electronically Signed   By: Ilona Sorrel M.D.   On: 01/22/2021 05:16    EKG EKG Interpretation  Date/Time:  Saturday January 22 2021 03:47:47 EDT Ventricular Rate:  97 PR Interval:  190 QRS Duration: 93 QT Interval:  352 QTC Calculation: 448 R Axis:   89 Text Interpretation: Sinus rhythm Atrial premature complex Confirmed by Dory Horn) on 01/22/2021 4:48:42 AM  Radiology DG Chest Portable 1 View  Result Date: 01/22/2021 CLINICAL DATA:  Chills EXAM: PORTABLE CHEST 1 VIEW COMPARISON:  07/18/2020 chest radiograph. FINDINGS:  Stable cardiomediastinal silhouette with normal heart size. No pneumothorax. No pleural effusion. Patchy medial right lung base opacity. IMPRESSION: Patchy medial right lung base opacity, suspicious for pneumonia. Recommend follow-up PA and lateral post treatment chest radiographs in 4-6 weeks. Electronically Signed   By: Ilona Sorrel M.D.   On: 01/22/2021 05:16    Procedures Procedures   Medications Ordered in ED Medications  vancomycin (VANCOCIN) IVPB 1000 mg/200 mL premix (1,000 mg Intravenous New Bag/Given 01/22/21 0623)  0.9 %  sodium chloride infusion (has no administration in time range)  ceFEPIme (MAXIPIME) 2 g in sodium chloride 0.9 % 100 mL IVPB (0 g Intravenous Stopped 01/22/21 0616)  lactated ringers bolus 1,000 mL (0 mLs Intravenous Stopped 01/22/21 0657)  iohexol (OMNIPAQUE) 350 MG/ML injection 100 mL (100 mLs Intravenous Contrast Given 01/22/21 0537)     ED Course  I have reviewed the triage vital signs and the nursing notes.  Pertinent labs & imaging results that were available during my care of the patient were reviewed by me and considered in my medical decision making (see chart for details).   I suspect this is a secondary infection, I suspect the patient had covid and this is a superimposed bacterial infection.  Cultures were drawn and appropriate antibiotics given.  Will admit to medicine  Final Clinical Impression(s) / ED Diagnoses Admit for multilobar pna with hypoxia  Kathrynn Backstrom, MD 01/22/21 [phone]

## 2021-01-22 NOTE — ED Notes (Signed)
Report given to [facility].  [facility] [place] called, receiving RN unavailable.  Left return contact information with "Mandy"

## 2021-01-22 NOTE — Progress Notes (Signed)
Patient: John Livingston, John Livingston  DOB: 09/23/1940 SXJ:[id number] Transferring facility: [facility] Requesting provider: Dr. Randal Buba (EDP at [facility]) Reason for transfer: admission for further evaluation and management of acute hypoxic respiratory distress due to suspected community-acquired pneumonia   80 year old male with history of paroxysmal atrial fibrillation chronically anticoagulated on Eliquis, who presented to [facility] on 01/22/2021 complaining of 1 day of progressive shortness of breath, cough, chills, full body rigors.  This was preceded by a few days of rhinitis, rhinorrhea, subjective fever that occurred approximately 1 week ago, with complete interval resolution leading up to development of the above constellation of symptoms with which she presented to the [place] today.   Vital signs at [facility] were notable for the following: Temperature max 100.2, respiratory rate 19-23, initial oxygen saturation in the mid 80s on room air, subsequently improving to 95 to 96% on 3 L nasal cannula, in the absence of any known baseline supplemental oxygen requirements.  Normotensive.  CBC notable for white cell count of 7500 with 83% neutrophils.  COVID-19/influenza PCR negative.  Initial troponin 3, BNP 46.  Chest x-ray was suggestive of right lower lobe infiltrate.  EKG sinus rhythm without reported acute ischemic changes.  Blood cultures collected, lactic acid pending.   Given suspicion for initial viral upper respiratory infection approximately a week ago with reported complete interval resolution of the symptoms for development of new constellation of symptoms over the last day, with some suspicion for potential secondary bacterial pneumonia prompting initiation of broad-spectrum IV antibiotics in the form of IV vancomycin and cefepime.  I confirmed that [facility] is unable to check procalcitonin.  CT chest pending.   Subsequently, I accepted this patient for transfer for admission to [facility] or [facility] (first  available ) for further work-up and management of acute hypoxic respiratory distress due to CAP.     Babs Bertin, DO Hospitalist

## 2021-01-22 NOTE — ED Triage Notes (Addendum)
Pt in w/sob that woke him up at 0230, arrives here with chills, shivering. Axillary temp 100.2. States he was dx w/sinus infection 2 wks ago. NP cough present during triage, O2 applied for RA sats of 88%. Reports episode of emesis PTA

## 2021-01-22 NOTE — ED Notes (Signed)
Patient transported to CT 

## 2021-01-22 NOTE — ED Notes (Signed)
Also reporting HTN - SBP's 170's

## 2021-01-23 DIAGNOSIS — J189 Pneumonia, unspecified organism: Secondary | ICD-10-CM | POA: Diagnosis not present

## 2021-01-23 LAB — CBC
HCT: 28.3 % — ABNORMAL LOW (ref 39.0–52.0)
Hemoglobin: 9.8 g/dL — ABNORMAL LOW (ref 13.0–17.0)
MCH: 31.6 pg (ref 26.0–34.0)
MCHC: 34.6 g/dL (ref 30.0–36.0)
MCV: 91.3 fL (ref 80.0–100.0)
Platelets: 131 10*3/uL — ABNORMAL LOW (ref 150–400)
RBC: 3.1 MIL/uL — ABNORMAL LOW (ref 4.22–5.81)
RDW: 14.5 % (ref 11.5–15.5)
WBC: 9.3 10*3/uL (ref 4.0–10.5)
nRBC: 0 % (ref 0.0–0.2)

## 2021-01-23 LAB — COMPREHENSIVE METABOLIC PANEL
ALT: 13 U/L (ref 0–44)
AST: 14 U/L — ABNORMAL LOW (ref 15–41)
Albumin: 3.3 g/dL — ABNORMAL LOW (ref 3.5–5.0)
Alkaline Phosphatase: 46 U/L (ref 38–126)
Anion gap: 7 (ref 5–15)
BUN: 15 mg/dL (ref 8–23)
CO2: 27 mmol/L (ref 22–32)
Calcium: 8.5 mg/dL — ABNORMAL LOW (ref 8.9–10.3)
Chloride: 105 mmol/L (ref 98–111)
Creatinine, Ser: 0.9 mg/dL (ref 0.61–1.24)
GFR, Estimated: >60 mL/min (ref >60)
Glucose, Bld: 115 mg/dL — ABNORMAL HIGH (ref 70–99)
Potassium: 3.3 mmol/L — ABNORMAL LOW (ref 3.5–5.1)
Sodium: 139 mmol/L (ref 135–145)
Total Bilirubin: 0.6 mg/dL (ref 0.3–1.2)
Total Protein: 6 g/dL — ABNORMAL LOW (ref 6.5–8.1)

## 2021-01-23 LAB — PROCALCITONIN: Procalcitonin: 3.14 ng/mL

## 2021-01-23 LAB — MAGNESIUM: Magnesium: 2 mg/dL (ref 1.7–2.4)

## 2021-01-23 MED ORDER — SACCHAROMYCES BOULARDII 250 MG PO CAPS: 250.0000 mg | ORAL_CAPSULE | Freq: Two times a day (BID) | ORAL | 0 refills | Status: DC

## 2021-01-23 MED ORDER — AMOXICILLIN-POT CLAVULANATE 875-125 MG PO TABS: 1.0000 | ORAL_TABLET | Freq: Two times a day (BID) | ORAL | 0 refills | Status: AC

## 2021-01-23 MED ORDER — TRAZODONE HCL 50 MG PO TABS: 50.0000 mg | ORAL_TABLET | Freq: Every day | ORAL | 1 refills | Status: DC

## 2021-01-23 MED ORDER — ALBUTEROL SULFATE HFA 108 (90 BASE) MCG/ACT IN AERS: 2.0000 | INHALATION_SPRAY | Freq: Four times a day (QID) | RESPIRATORY_TRACT | 6 refills | Status: DC | PRN

## 2021-01-23 MED ORDER — POTASSIUM CHLORIDE CRYS ER 20 MEQ PO TBCR
40.0000 meq | EXTENDED_RELEASE_TABLET | Freq: Once | ORAL | Status: AC
  Administered 2021-01-23: 40 meq via ORAL
  Filled 2021-01-23: qty 2

## 2021-01-23 MED ORDER — MELATONIN 3 MG PO CAPS: 3.0000 mg | ORAL_CAPSULE | Freq: Every day | ORAL | 0 refills | Status: DC

## 2021-01-23 NOTE — Progress Notes (Signed)
SATURATION QUALIFICATIONS: (This note is used to comply with regulatory documentation for home oxygen)  Patient Saturations on Room Air at Rest = 95%  Patient Saturations on Room Air while Ambulating = 90%  Patient maintains oxygen level of 90% or greater on room air while ambulating.  Nicklaus Alviar, Laurel Dimmer, RN

## 2021-01-23 NOTE — Care Management Obs Status (Signed)
[place] NOTIFICATION   Patient Details  Name: John Livingston MRN: [id number] Date of Birth: 1941-01-07   Medicare Observation Status Notification Given:  Yes    Cecil Cobbs 01/23/2021, 3:17 PM

## 2021-01-23 NOTE — Discharge Summary (Signed)
Physician Discharge Summary  PER BEAGLEY NFA:[id number] DOB: 08/19/1940 DOA: 01/22/2021  PCP: Milana Na., MD  Admit date: 01/22/2021 Discharge date: 01/23/2021  Admitted From: home Disposition:  home  Recommendations for Outpatient Follow-up:  Follow up with PCP in 1-2 weeks Please obtain BMP/CBC in one week Repeat chest x-ray in 4 weeks to follow-up on the resolution of pneumonia  Home Health:none Equipment/Devices:none  Discharge Condition:stable CODE STATUS:full Diet recommendation: cardiac Brief/Interim Summary:Rashaud S Fischman is a 80 y.o. male with medical history significant of Baker's cyst of the left knee, appendix cancer, GERD, hypertension, mild asthma, prostate cancer, aortic atherosclerosis, CAD, who came into the emergency department due to dyspnea, wheezing, chills, fever, rigors following 2-week history of a sinus infection.  He stated that he he was getting better but then developed the symptoms.  He has also been having frontal headache.  He denied chest pain, palpitations, diaphoresis, PND, orthopnea or pitting edema of the lower extremities.  No abdominal pain, appetite changes, nausea, emesis, diarrhea, constipation, melena or hematochezia.  Complained of nocturia, but denied dysuria, flank pain, frequency or hematuria.  No polyuria, polydipsia, polyphagia or blurred vision.   ED Course: Initial vital signs were temperature 99.5 F, pulse 98, respirations 23, BP 150/67 mmHg O2 sat 88 to 90% on room air.  The patient received supplemental oxygen, cefepime and vancomycin.     Lab work: CBC is her white count 7.5 with 82% neutrophils, hemoglobin 12.2 g/dL and platelets 166.  CMP was unremarkable except for glucose of 111 mg/dL.  Lactic acid x2, troponin x2 and BNP were normal.  Coronavirus and flu was negative.   Imaging: Portable chest radiograph showed patchy medial right lung base opacity suspicious for pneumonia.  CTA PE protocol was negative for embolism but  showed patchy peribronchovascular consolidation and groundglass opacity throughout the right lung, most prominent in the right middle lobe compatible with multilobar pneumonia.   Discharge Diagnoses:  Principal Problem:   CAP (community acquired pneumonia) Active Problems:   GERD (gastroesophageal reflux disease)   AF (paroxysmal atrial fibrillation) (HCC)   Mild intermittent asthma   Hypertension   Aortic atherosclerosis (HCC)   CAD (coronary artery disease)   Microcytic anemia   Acute on chronic respiratory failure with hypoxia (HCC)   #1 community-acquired pneumonia patient admitted with fever chills cough and shortness of breath.  Chest x-ray showed right middle lobe infiltrates consistent with consolidation.  CT of the chest showed groundglass opacity throughout the right lung.  COVID and influenza were negative.  Lactic acid normal procalcitonin normal.  His white count was normal.  He was treated with Rocephin and azithromycin and as needed oxygen.  On the day of discharge ambulatory O2 sats was done Patient Saturations on Room Air at Rest = 95% Patient Saturations on Room Air while Ambulating = 90% Patient maintains oxygen level of 90% or greater on room air while ambulating. He will be discharged on Augmentin for 5 days.    #2 mild intermittent asthma continue inhalers.  #3 paroxysmal atrial fibrillation continue metoprolol and Eliquis  #4 history of CAD on atorvastatin Eliquis and metoprolol follow-up with Dr. Sallyanne Kuster  Estimated body mass index is 28.82 kg/m as calculated from the following:   Height as of this encounter: 5' 9.5" (1.765 m).   Weight as of this encounter: 89.8 kg.  Discharge Instructions   Allergies as of 01/23/2021   No Known Allergies      Medication List     STOP taking  these medications    erythromycin ophthalmic ointment       TAKE these medications    acetaminophen 500 MG tablet Commonly known as: TYLENOL Take 500 mg by mouth 3  (three) times daily as needed for headache (pain).   albuterol 108 (90 Base) MCG/ACT inhaler Commonly known as: VENTOLIN HFA Inhale 2 puffs into the lungs every 6 (six) hours as needed for wheezing or shortness of breath.   alfuzosin 10 MG 24 hr tablet Commonly known as: UROXATRAL Take 1 tablet (10 mg total) by mouth daily with supper.   amoxicillin-clavulanate 875-125 MG tablet Commonly known as: Augmentin Take 1 tablet by mouth 2 (two) times daily for 5 days.   atorvastatin 10 MG tablet Commonly known as: LIPITOR TAKE 1 TABLET BY MOUTH EVERY DAY What changed: when to take this   brimonidine 0.2 % ophthalmic solution Commonly known as: ALPHAGAN Place 1 drop into both eyes 3 (three) times daily.   diphenhydrAMINE 25 MG tablet Commonly known as: SOMINEX Take 25 mg by mouth at bedtime.   diphenhydramine-acetaminophen 25-500 MG Tabs tablet Commonly known as: TYLENOL PM Take 2 tablets by mouth at bedtime.   Eliquis 5 MG Tabs tablet Generic drug: apixaban TAKE 1 TABLET BY MOUTH TWICE A DAY What changed: how much to take   fludrocortisone 0.1 MG tablet Commonly known as: FLORINEF Take one tablet 4 days a week (Tuesday, Thursday, Saturday and Sunday) What changed:  how much to take how to take this when to take this additional instructions   lipase/protease/amylase 36000 UNITS Cpep capsule Commonly known as: CREON Take 36,000 Units by mouth daily with supper.   metoprolol succinate 25 MG 24 hr tablet Commonly known as: TOPROL-XL Take 0.5 tablets (12.5 mg total) by mouth daily. Take with or immediately following a meal. May take an extra half a tablet (12.5 mg) as needed once daily for palpitations. What changed:  when to take this additional instructions   omeprazole 40 MG capsule Commonly known as: PRILOSEC Take 40 mg by mouth every morning.        No Known Allergies  Consultations: none   Procedures/Studies: CT Angio Chest PE W and/or Wo  Contrast  Result Date: 01/22/2021 CLINICAL DATA:  Dyspnea, chills, she of ring EXAM: CT ANGIOGRAPHY CHEST WITH CONTRAST TECHNIQUE: Multidetector CT imaging of the chest was performed using the standard protocol during bolus administration of intravenous contrast. Multiplanar CT image reconstructions and MIPs were obtained to evaluate the vascular anatomy. CONTRAST:  156mL OMNIPAQUE IOHEXOL 350 MG/ML SOLN COMPARISON:  Chest radiograph from earlier today. 03/29/2015 chest CT angiogram. FINDINGS: Cardiovascular: The study is high quality for the evaluation of pulmonary embolism. There are no filling defects in the central, lobar, segmental or subsegmental pulmonary artery branches to suggest acute pulmonary embolism. Atherosclerotic nonaneurysmal thoracic aorta. Normal caliber pulmonary arteries. Normal heart size. No significant pericardial fluid/thickening. Left anterior descending coronary atherosclerosis. Mediastinum/Nodes: No discrete thyroid nodules. Unremarkable esophagus. No pathologically enlarged axillary, mediastinal or hilar lymph nodes. Lungs/Pleura: No pneumothorax. No pleural effusion. Patchy peribronchovascular consolidation and ground-glass opacity throughout the right lung, most prominent in the right middle lobe. Scattered perilobular lines in the peripheral left lung compatible with nonspecific mild postinfectious scarring. No lung masses or significant pulmonary nodules. Upper abdomen: Cholecystectomy. Mild diffuse colonic diverticulosis. Musculoskeletal: No aggressive appearing focal osseous lesions. Moderate thoracic spondylosis. Review of the MIP images confirms the above findings. IMPRESSION: 1. No pulmonary embolism. 2. Patchy peribronchovascular consolidation and ground-glass opacity throughout the right lung,  most prominent in the right middle lobe, compatible with multilobar pneumonia. Recommend follow-up PA and lateral post treatment chest radiographs in 4-6 weeks. 3. One vessel coronary  atherosclerosis. 4. Aortic Atherosclerosis (ICD10-I70.0). Electronically Signed   By: Ilona Sorrel M.D.   On: 01/22/2021 06:12   DG Chest Portable 1 View  Result Date: 01/22/2021 CLINICAL DATA:  Chills EXAM: PORTABLE CHEST 1 VIEW COMPARISON:  07/18/2020 chest radiograph. FINDINGS: Stable cardiomediastinal silhouette with normal heart size. No pneumothorax. No pleural effusion. Patchy medial right lung base opacity. IMPRESSION: Patchy medial right lung base opacity, suspicious for pneumonia. Recommend follow-up PA and lateral post treatment chest radiographs in 4-6 weeks. Electronically Signed   By: Ilona Sorrel M.D.   On: 01/22/2021 05:16   (Echo, Carotid, EGD, Colonoscopy, ERCP)    Subjective: Patient sitting up in chair in no acute distress awake alert answering questions appropriately and following commands Talks in full sentences without being short of breath ambulatory oxygen saturation shows he is 90% on room air with ambulation.  Discharge Exam: Vitals:   01/23/21 1225 01/23/21 1238  BP:    Pulse:    Resp:    Temp:    SpO2: 90% 90%   Vitals:   01/23/21 0302 01/23/21 0531 01/23/21 1225 01/23/21 1238  BP:  (!) 142/72    Pulse:  86    Resp:  19    Temp:  99.1 F (37.3 C)    TempSrc:  Oral    SpO2: 98% 94% 90% 90%  Weight:      Height:        General: Pt is alert, awake, not in acute distress Cardiovascular: RRR, S1/S2 +, no rubs, no gallops Respiratory: Few rhonchi right  Abdominal: Soft, NT, ND, bowel sounds + Extremities: no edema, no cyanosis    The results of significant diagnostics from this hospitalization (including imaging, microbiology, ancillary and laboratory) are listed below for reference.     Microbiology: Recent Results (from the past 240 hour(s))  Resp Panel by RT-PCR (Flu A&B, Covid) Nasopharyngeal Swab     Status: None   Collection Time: 01/22/21  4:00 AM   Specimen: Nasopharyngeal Swab; Nasopharyngeal(NP) swabs in vial transport medium  Result  Value Ref Range Status   SARS Coronavirus 2 by RT PCR NEGATIVE NEGATIVE Final    Comment: (NOTE) SARS-CoV-2 target nucleic acids are NOT DETECTED.  The SARS-CoV-2 RNA is generally detectable in upper respiratory specimens during the acute phase of infection. The lowest concentration of SARS-CoV-2 viral copies this assay can detect is 138 copies/mL. A negative result does not preclude SARS-Cov-2 infection and should not be used as the sole basis for treatment or other patient management decisions. A negative result may occur with  improper specimen collection/handling, submission of specimen other than nasopharyngeal swab, presence of viral mutation(s) within the areas targeted by this assay, and inadequate number of viral copies(<138 copies/mL). A negative result must be combined with clinical observations, patient history, and epidemiological information. The expected result is Negative.  Fact Sheet for Patients:  EntrepreneurPulse.com.au  Fact Sheet for Healthcare Providers:  IncredibleEmployment.be  This test is no t yet approved or cleared by the [place] FDA and  has been authorized for detection and/or diagnosis of SARS-CoV-2 by FDA under an Emergency Use Authorization (EUA). This EUA will remain  in effect (meaning this test can be used) for the duration of the COVID-19 declaration under Section 564(b)(1) of the Act, 21 U.S.C.section 360bbb-3(b)(1), unless the authorization is terminated  or revoked sooner.       Influenza A by PCR NEGATIVE NEGATIVE Final   Influenza B by PCR NEGATIVE NEGATIVE Final    Comment: (NOTE) The Xpert Xpress SARS-CoV-2/FLU/RSV plus assay is intended as an aid in the diagnosis of influenza from Nasopharyngeal swab specimens and should not be used as a sole basis for treatment. Nasal washings and aspirates are unacceptable for Xpert Xpress SARS-CoV-2/FLU/RSV testing.  Fact Sheet for  Patients: EntrepreneurPulse.com.au  Fact Sheet for Healthcare Providers: IncredibleEmployment.be  This test is not yet approved or cleared by the [place] FDA and has been authorized for detection and/or diagnosis of SARS-CoV-2 by FDA under an Emergency Use Authorization (EUA). This EUA will remain in effect (meaning this test can be used) for the duration of the COVID-19 declaration under Section 564(b)(1) of the Act, 21 U.S.C. section 360bbb-3(b)(1), unless the authorization is terminated or revoked.  Performed at [facility], [address] [place], [place] [address]   Blood culture (routine x 2)     Status: None (Preliminary result)   Collection Time: 01/22/21  5:08 AM   Specimen: BLOOD RIGHT WRIST  Result Value Ref Range Status   Specimen Description   Final    BLOOD RIGHT WRIST Performed at [facility], [address] [place], [place] [address]    Special Requests   Final    BOTTLES DRAWN AEROBIC AND ANAEROBIC Blood Culture results may not be optimal due to an inadequate volume of blood received in culture bottles Performed at [facility], [address] [place], [place] [address]    Culture   Final    NO GROWTH < 24 HOURS Performed at [facility], [place] [address] [place], [place] [address]    Report Status PENDING  Incomplete  Blood culture (routine x 2)     Status: None (Preliminary result)   Collection Time: 01/22/21  5:08 AM   Specimen: BLOOD  Result Value Ref Range Status   Specimen Description   Final    BLOOD RIGHT ANTECUBITAL Performed at [facility], [address] [place], [place] [address]    Special Requests   Final    BOTTLES DRAWN AEROBIC AND ANAEROBIC Blood Culture adequate volume Performed at [facility], [address] [place], [place] [address]    Culture   Final    NO GROWTH < 24 HOURS Performed at [facility], [place] [address] [place], [place] [address]    Report Status PENDING  Incomplete     Labs: BNP (last 3 results) Recent Labs    07/18/20 0937 01/22/21 0404  BNP 88.0 41.9   Basic Metabolic Panel: Recent Labs  Lab 01/22/21 0404 01/22/21 1602 01/23/21 0340 01/23/21 0706  NA 138  --  139  --   K 4.1  --  3.3*  --   CL 105  --  105  --   CO2 22  --  27  --   GLUCOSE 111*  --  115*  --   BUN 21  --  15  --   CREATININE 1.18  --  0.90  --   CALCIUM 9.1  --  8.5*  --   MG  --  2.0  --  2.0  PHOS  --  3.2  --   --    Liver Function Tests: Recent Labs  Lab 01/22/21 0404 01/23/21 0340  AST 21 14*  ALT 13 13  ALKPHOS 70 46  BILITOT 0.4 0.6  PROT 7.0 6.0*  ALBUMIN 4.1 3.3*   No results for input(s): LIPASE, AMYLASE in the last 168 hours. No results for input(s): AMMONIA in the last 168 hours. CBC: Recent Labs  Lab 01/22/21 0404 01/23/21 0340  WBC 7.5 9.3  NEUTROABS 6.2  --   HGB 12.2* 9.8*  HCT 35.3* 28.3*  MCV 90.7 91.3  PLT 166 131*   Cardiac Enzymes: No results for input(s): CKTOTAL, CKMB, CKMBINDEX, TROPONINI in the last 168 hours. BNP: Invalid input(s): POCBNP CBG: No results for input(s): GLUCAP in the last 168 hours. D-Dimer No results for input(s): DDIMER in the last 72 hours. Hgb A1c No results for input(s): HGBA1C in the last 72 hours. Lipid Profile No results for input(s): CHOL, HDL, LDLCALC, TRIG, CHOLHDL, LDLDIRECT in the last 72 hours. Thyroid function studies No results for input(s): TSH, T4TOTAL, T3FREE, THYROIDAB in the last 72 hours.  Invalid input(s): FREET3 Anemia work up No results for input(s): VITAMINB12, FOLATE, FERRITIN, TIBC, IRON, RETICCTPCT in the last 72 hours. Urinalysis    Component Value Date/Time   COLORURINE YELLOW 01/10/2019 1846   APPEARANCEUR CLEAR 01/10/2019 1846   LABSPEC 1.025 01/10/2019 1846   PHURINE 5.5 01/10/2019 1846   GLUCOSEU NEGATIVE 01/10/2019 1846   HGBUR NEGATIVE 01/10/2019 1846   BILIRUBINUR NEGATIVE 01/10/2019  1846   KETONESUR NEGATIVE 01/10/2019 1846   PROTEINUR NEGATIVE 01/10/2019 1846   UROBILINOGEN 0.2 08/02/2013 1122   NITRITE NEGATIVE 01/10/2019 1846   LEUKOCYTESUR NEGATIVE 01/10/2019 1846   Sepsis Labs Invalid input(s): PROCALCITONIN,  WBC,  LACTICIDVEN Microbiology Recent Results (from the past 240 hour(s))  Resp Panel by RT-PCR (Flu A&B, Covid) Nasopharyngeal Swab     Status: None   Collection Time: 01/22/21  4:00 AM   Specimen: Nasopharyngeal Swab; Nasopharyngeal(NP) swabs in vial transport medium  Result Value Ref Range Status   SARS Coronavirus 2 by RT PCR NEGATIVE NEGATIVE Final    Comment: (NOTE) SARS-CoV-2 target nucleic acids are NOT DETECTED.  The SARS-CoV-2 RNA is generally detectable in upper respiratory specimens during the acute phase of infection. The lowest concentration of SARS-CoV-2 viral copies this assay can detect is 138 copies/mL. A negative result does not preclude SARS-Cov-2 infection and should not be used as the sole basis for treatment or other patient management decisions. A negative result may occur with  improper specimen collection/handling, submission of specimen other than nasopharyngeal swab, presence of viral mutation(s) within the areas targeted by this assay, and inadequate number of viral copies(<138 copies/mL). A negative result must be combined with clinical observations, patient history, and epidemiological information. The expected result is Negative.  Fact Sheet for Patients:  EntrepreneurPulse.com.au  Fact Sheet for Healthcare Providers:  IncredibleEmployment.be  This test is no t yet approved or cleared by the [place] FDA and  has been authorized for detection and/or diagnosis of SARS-CoV-2 by FDA under an Emergency Use Authorization (EUA). This EUA will remain  in effect (meaning this test can be used) for the duration of the COVID-19 declaration under Section 564(b)(1) of the Act,  21 U.S.C.section 360bbb-3(b)(1), unless the authorization is terminated  or revoked sooner.       Influenza A by PCR NEGATIVE NEGATIVE Final   Influenza B by PCR NEGATIVE NEGATIVE Final    Comment: (NOTE) The Xpert Xpress SARS-CoV-2/FLU/RSV plus assay is intended as an aid in the diagnosis of influenza from Nasopharyngeal swab specimens and should  not be used as a sole basis for treatment. Nasal washings and aspirates are unacceptable for Xpert Xpress SARS-CoV-2/FLU/RSV testing.  Fact Sheet for Patients: EntrepreneurPulse.com.au  Fact Sheet for Healthcare Providers: IncredibleEmployment.be  This test is not yet approved or cleared by the [place] FDA and has been authorized for detection and/or diagnosis of SARS-CoV-2 by FDA under an Emergency Use Authorization (EUA). This EUA will remain in effect (meaning this test can be used) for the duration of the COVID-19 declaration under Section 564(b)(1) of the Act, 21 U.S.C. section 360bbb-3(b)(1), unless the authorization is terminated or revoked.  Performed at [facility], [address] [place], [place] [address]   Blood culture (routine x 2)     Status: None (Preliminary result)   Collection Time: 01/22/21  5:08 AM   Specimen: BLOOD RIGHT WRIST  Result Value Ref Range Status   Specimen Description   Final    BLOOD RIGHT WRIST Performed at [facility], [address] [place], [place] [address]    Special Requests   Final    BOTTLES DRAWN AEROBIC AND ANAEROBIC Blood Culture results may not be optimal due to an inadequate volume of blood received in culture bottles Performed at [facility], [address] [place], [place] [address]    Culture   Final    NO GROWTH < 24 HOURS Performed at [facility], [place] [address], [place], [place] [address]    Report Status PENDING  Incomplete  Blood culture (routine x 2)     Status: None (Preliminary  result)   Collection Time: 01/22/21  5:08 AM   Specimen: BLOOD  Result Value Ref Range Status   Specimen Description   Final    BLOOD RIGHT ANTECUBITAL Performed at [facility], [address] [place], [place] [address]    Special Requests   Final    BOTTLES DRAWN AEROBIC AND ANAEROBIC Blood Culture adequate volume Performed at [facility], [address] [place], [place] [address]    Culture   Final    NO GROWTH < 24 HOURS Performed at [facility], [place] [address] [place], [place] [address]    Report Status PENDING  Incomplete     Time coordinating discharge: 39 minutes  SIGNED:   Georgette Shell, MD  [facility] 01/23/2021, 3:10 PM

## 2021-01-23 NOTE — Care Management CC44 (Signed)
Condition Code 44 Documentation Completed  Patient Details  Name: John Livingston MRN: [id number] Date of Birth: 1940-12-18   Condition Code 44 given:  Yes Patient signature on Condition Code 44 notice:  Yes Documentation of 2 MD's agreement:  Yes Code 44 added to claim:  Yes    Ross Ludwig, LCSW 01/23/2021, 3:18 PM

## 2021-01-27 LAB — CULTURE, BLOOD (ROUTINE X 2)
Culture: NO GROWTH
Culture: NO GROWTH
Special Requests: ADEQUATE

## 2021-01-30 ENCOUNTER — Other Ambulatory Visit: Payer: Self-pay | Admitting: Cardiovascular Disease

## 2021-01-30 NOTE — Progress Notes (Signed)
Cardiology Office Note:    Date:  02/04/2021   ID:  John Livingston, DOB 01/27/1941, MRN [id number]  PCP:  Puschinsky, Fransico Him., MD  Cardiologist:  Sanda Klein, MD   Referring MD: Milana Na.,*   Chief Complaint  Patient presents with   Hospitalization Follow-up    PAF, PNA    History of Present Illness:    John Livingston is a 80 y.o. male with a hx of PAF on Eliquis, CAD, asthma and aortic atherosclerosis.  He has struggled with symptomatic hypotension with presyncope.  A. fib is rate controlled with a low-dose of metoprolol.  He is anticoagulated with Eliquis.  Recent labs excluded adrenal insufficiency, but he was started on florinef to help boost his BP.  PYP scan was negative for amyloidosis.  He was recently admitted and discharged on 01/23/2021 with pneumonia.  He presents today for routine cardiology follow-up.  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 He denies palpitations, chest pain, and dyspnea. He has recovered well from PNA.   BP in the office is high at 172/86. He is on 12.5 mg toprol and is still taking 0.1 mg florinef Tues, Th, Sat, Sun. He has not had an episode of dizziness. Question ongoing need for florinef. BP was 118/62 at last clinic visit. I have asked them to hold florinef this weekend and keep a BP log. I certainly don't want him dizzy/falling on eliquis, but would like his BP to be a bit lower. They are in agreement with this plan.    Past Medical History:  Diagnosis Date   Baker's cyst of knee    left   Cancer of appendix ([place])    GERD (gastroesophageal reflux disease)    History of kidney stones    Hypertension    Moderate persistent asthma    pt. denies   Prostate cancer ([place])    Ureteral stone     Past Surgical History:  Procedure Laterality Date   APPENDECTOMY     cartilage surgery in nose     CHOLECYSTECTOMY N/A 03/06/2019   Procedure: LAPAROSCOPIC CHOLECYSTECTOMY;  Surgeon: Coralie Keens, MD;  Location: [facility];  Service: General;  Laterality: N/A;   IR EXCHANGE BILIARY DRAIN  02/21/2019   IR PERC CHOLECYSTOSTOMY  01/17/2019   IR RADIOLOGIST EVAL & MGMT  02/19/2019    Current Medications: Current Meds  Medication Sig   acetaminophen (TYLENOL) 500 MG tablet Take 500 mg by mouth 3 (three) times daily as needed for headache (pain).   albuterol (VENTOLIN HFA) 108 (90 Base) MCG/ACT inhaler Inhale 2 puffs into the lungs every 6 (six) hours as needed for wheezing or shortness of breath.   alfuzosin (UROXATRAL) 10 MG 24 hr tablet Take 1 tablet (10 mg total) by mouth daily with supper.   atorvastatin (LIPITOR) 10 MG tablet TAKE 1 TABLET BY MOUTH EVERY DAY (Patient taking differently: Take 10 mg by mouth every morning.)   brimonidine (ALPHAGAN) 0.2 % ophthalmic solution Place 1 drop into both eyes 3 (three) times daily.   ELIQUIS 5 MG TABS tablet TAKE 1 TABLET BY MOUTH TWICE A DAY (Patient taking differently: Take 5 mg by mouth 2 (two) times daily.)   fludrocortisone (FLORINEF) 0.1 MG tablet Take one tablet 4 days a week (Tuesday, Thursday, Saturday and Sunday) (Patient taking differently: Take 0.1 mg by mouth See admin instructions. Take one tablet (0.1 mg) by mouth 4 days a week (Tuesday, Thursday,  Saturday and Sunday))   lipase/protease/amylase (CREON) 36000 UNITS CPEP capsule Take 36,000 Units by mouth daily with supper.   Melatonin 3 MG CAPS Take 1 capsule (3 mg total) by mouth at bedtime.   omeprazole (PRILOSEC) 40 MG capsule Take 40 mg by mouth every morning.   saccharomyces boulardii (FLORASTOR) 250 MG capsule Take 1 capsule (250 mg total) by mouth 2 (two) times daily.   traZODone (DESYREL) 50 MG tablet Take 1 tablet (50 mg total) by mouth at bedtime.     Allergies:   Patient has no known allergies.   Social History   Socioeconomic History   Marital status: Married    Spouse name: Not on file   Number of children: Not on file   Years of education: Not on file    Highest education level: Not on file  Occupational History   Occupation: Buisness Owner    Comment: [employer]  Tobacco Use   Smoking status: Former    Packs/day: 1.00    Years: 20.00    Pack years: 20.00    Types: Cigarettes    Quit date: 03/20/1978    Years since quitting: 42.9   Smokeless tobacco: Never  Vaping Use   Vaping Use: Never used  Substance and Sexual Activity   Alcohol use: Not Currently    Alcohol/week: 1.0 standard drink    Types: 1 Cans of beer per week   Drug use: No   Sexual activity: Not on file  Other Topics Concern   Not on file  Social History Narrative   Not on file   Social Determinants of Health   Financial Resource Strain: Not on file  Food Insecurity: Not on file  Transportation Needs: Not on file  Physical Activity: Not on file  Stress: Not on file  Social Connections: Not on file     Family History: The patient's family history includes Asthma in his son; Heart disease in his mother; Prostate cancer in his father.  ROS:   Please see the history of present illness.     All other systems reviewed and are negative.  EKGs/Labs/Other Studies Reviewed:    The following studies were reviewed today:  PYP 2022   By semi-quantitative assessment scan is consistent with no increased heart uptake-Grade 0. Heart to contralateral lung ratio is between 1-1.5, indeterminate for amyloid.   Study is equivocal for TTR amyloidosis (visual score of 1/ratio between 1-1.5).   Prior study not available for comparison.   Visual grade 0 at 3 hours (less than rib), but H/CL 1.2.  Overall, findings are inconsistent with TTR amyloidosis.   EKG:  EKG is not ordered today.    Recent Labs: 07/29/2020: TSH 4.830 01/22/2021: B Natriuretic Peptide 46.1 01/23/2021: ALT 13; BUN 15; Creatinine, Ser 0.90; Hemoglobin 9.8; Magnesium 2.0; Platelets 131; Potassium 3.3; Sodium 139  Recent Lipid Panel    Component Value Date/Time   CHOL 175 05/01/2019 0846   TRIG 90  05/01/2019 0846   HDL 37 (L) 05/01/2019 0846   CHOLHDL 4.7 05/01/2019 0846   LDLCALC 121 (H) 05/01/2019 0846    Physical Exam:    VS:  BP (!) 172/86   Pulse 89   Ht 5' 9.5" (1.765 m)   Wt 197 lb (89.4 kg)   SpO2 97%   BMI 28.67 kg/m     Wt Readings from Last 3 Encounters:  02/04/21 197 lb (89.4 kg)  01/22/21 197 lb 15.6 oz (89.8 kg)  12/03/20 201 lb (91.2  kg)     GEN: elderly male, NAD HEENT: Normal NECK: No JVD; No carotid bruits LYMPHATICS: No lymphadenopathy CARDIAC:irregular rhythm, regular rate, no murmur RESPIRATORY:  Clear to auscultation without rales, wheezing or rhonchi  ABDOMEN: Soft, non-tender, non-distended MUSCULOSKELETAL:  No edema; No deformity  SKIN: Warm and dry NEUROLOGIC:  Alert and oriented x 3 PSYCHIATRIC:  Normal affect   ASSESSMENT:    1. Paroxysmal atrial fibrillation (HCC)   2. Long term current use of anticoagulant   3. Symptomatic hypotension   4. Hypertension, unspecified type   5. Aortic atherosclerosis (HCC)    PLAN:    In order of problems listed above:  Paroxysmal atrial fibrillation Chronic anticoagulation CHA2DS2-VASc 4 (age 46, hypertension, aortic atherosclerosis) Rate controlled with metoprolol. Asymptomatic.   Symptomatic hypotension Adrenal insufficiency has been ruled out He was started on Florinef 0.1 mg T/TH/S/S Flomax was transitioned to Uroxatrol. Cardiac amyloidosis was considered and he underwent PYP scan on 12/03/2020 which was negative for amyloidosis.    Hypertension He is hypertensive in the office today. I have asked him to hold florinef and track BP. He will call back with readings. He doesn't check his BP regularly.    Aortic atherosclerosis No angina, continue statin   Follow up with Dr. Sallyanne Kuster in 6 months.    Medication Adjustments/Labs and Tests Ordered: Current medicines are reviewed at length with the patient today.  Concerns regarding medicines are outlined above.  No orders of the  defined types were placed in this encounter.  No orders of the defined types were placed in this encounter.   Signed, Ledora Bottcher, [place]  02/04/2021 10:43 AM    [facility]

## 2021-02-04 ENCOUNTER — Ambulatory Visit (INDEPENDENT_AMBULATORY_CARE_PROVIDER_SITE_OTHER): Payer: Medicare Other | Admitting: Physician Assistant

## 2021-02-04 ENCOUNTER — Encounter: Payer: Self-pay | Admitting: Physician Assistant

## 2021-02-04 ENCOUNTER — Other Ambulatory Visit: Payer: Self-pay

## 2021-02-04 VITALS — BP 172/86 | HR 89 | Ht 69.5 in | Wt 197.0 lb

## 2021-02-04 DIAGNOSIS — I959 Hypotension, unspecified: Secondary | ICD-10-CM

## 2021-02-04 DIAGNOSIS — I480 Paroxysmal atrial fibrillation: Secondary | ICD-10-CM | POA: Diagnosis not present

## 2021-02-04 DIAGNOSIS — Z7901 Long term (current) use of anticoagulants: Secondary | ICD-10-CM | POA: Diagnosis not present

## 2021-02-04 DIAGNOSIS — I700 Atherosclerosis of aorta: Secondary | ICD-10-CM

## 2021-02-04 DIAGNOSIS — I10 Essential (primary) hypertension: Secondary | ICD-10-CM | POA: Diagnosis not present

## 2021-02-04 NOTE — Patient Instructions (Addendum)
Medication Instructions:  HOLD Florinef  *If you need a refill on your cardiac medications before your next appointment, please call your pharmacy*  Lab Work: NONE ordered at this time of appointment   If you have labs (blood work) drawn today and your tests are completely normal, you will receive your results only by: [facility] (if you have MyChart) OR A paper copy in the mail If you have any lab test that is abnormal or we need to change your treatment, we will call you to review the results.  Testing/Procedures: NONE ordered at this time of appointment   Follow-Up: At [facility], you and your health needs are our priority.  As part of our continuing mission to provide you with exceptional heart care, we have created designated Provider Care Teams.  These Care Teams include your primary Cardiologist (physician) and Advanced Practice Providers (APPs -  Physician Assistants and Nurse Practitioners) who all work together to provide you with the care you need, when you need it.  Your next appointment:   6 month(s)  The format for your next appointment:   In Person  Provider:   Sanda Klein, MD    Other Instructions Continue to monitor blood pressure at home. Send in reading in weeks via MyChart

## 2021-03-20 ENCOUNTER — Other Ambulatory Visit: Payer: Self-pay | Admitting: Cardiovascular Disease

## 2021-03-22 NOTE — Telephone Encounter (Signed)
Prescription refill request for Eliquis received. Indication: Afib  Last office visit:02/04/21 ([facility])  Scr: 0.90 (01/23/21)  Age: 81 Weight: 89.4kg  Appropriate dose and refill sent to requested pharmacy.

## 2021-04-16 ENCOUNTER — Other Ambulatory Visit: Payer: Self-pay | Admitting: Cardiovascular Disease

## 2021-04-26 ENCOUNTER — Other Ambulatory Visit: Payer: Self-pay | Admitting: Cardiovascular Disease

## 2021-04-28 ENCOUNTER — Other Ambulatory Visit: Payer: Self-pay | Admitting: Cardiovascular Disease

## 2021-04-29 ENCOUNTER — Telehealth: Payer: Self-pay | Admitting: Cardiovascular Disease

## 2021-04-29 ENCOUNTER — Other Ambulatory Visit: Payer: Self-pay | Admitting: Cardiovascular Disease

## 2021-04-29 NOTE — Telephone Encounter (Signed)
°*  STAT* If patient is at the pharmacy, call can be transferred to refill team.   1. Which medications need to be refilled? (please list name of each medication and dose if known)  alfuzosin (UROXATRAL) 10 MG 24 hr tablet  2. Which pharmacy/location (including street and city if local pharmacy) is medication to be sent to? [id number] - [place], [place] - [address] AT [address]OSS [address]  3. Do they need a 30 day or 90 day supply?  90 day supply   Pharmacy did not receive prescription patient is almost out of medication.

## 2021-04-29 NOTE — Telephone Encounter (Signed)
OK to refill 90 days 3RF

## 2021-04-30 MED ORDER — ALFUZOSIN HCL ER 10 MG PO TB24: 10.0000 mg | ORAL_TABLET | Freq: Every day | ORAL | 3 refills | Status: DC

## 2021-04-30 NOTE — Telephone Encounter (Signed)
Refill sent.

## 2021-08-15 ENCOUNTER — Other Ambulatory Visit: Payer: Self-pay | Admitting: Cardiovascular Disease

## 2021-08-27 ENCOUNTER — Other Ambulatory Visit: Payer: Self-pay | Admitting: Cardiovascular Disease

## 2021-10-23 ENCOUNTER — Other Ambulatory Visit: Payer: Self-pay | Admitting: Cardiovascular Disease

## 2021-10-24 NOTE — Telephone Encounter (Signed)
Prescription refill request for Eliquis received. Indication: PAF Last office visit: 02/04/21  A [facility] PA Scr: 1.17 on 06/24/21 Age: 81 Weight: 89.4kg  Based on above findings Eliquis '5mg'$  twice daily is the appropriate dose.  Pt is past due for appt with Dr Loletha Grayer.  Message sent to schedulers to make appt.  Refill approved x 1.

## 2021-11-26 ENCOUNTER — Other Ambulatory Visit: Payer: Self-pay

## 2021-11-26 ENCOUNTER — Emergency Department (HOSPITAL_BASED_OUTPATIENT_CLINIC_OR_DEPARTMENT_OTHER): Payer: Medicare Other

## 2021-11-26 ENCOUNTER — Emergency Department (HOSPITAL_BASED_OUTPATIENT_CLINIC_OR_DEPARTMENT_OTHER)
Admission: EM | Admit: 2021-11-26 | Discharge: 2021-11-26 | Disposition: A | Discharge status: Home / Self Care | Payer: Medicare Other | Attending: Emergency Medicine | Admitting: Emergency Medicine

## 2021-11-26 ENCOUNTER — Encounter (HOSPITAL_BASED_OUTPATIENT_CLINIC_OR_DEPARTMENT_OTHER): Payer: Self-pay | Admitting: Emergency Medicine

## 2021-11-26 DIAGNOSIS — Z87891 Personal history of nicotine dependence: Secondary | ICD-10-CM | POA: Diagnosis not present

## 2021-11-26 DIAGNOSIS — Z7901 Long term (current) use of anticoagulants: Secondary | ICD-10-CM | POA: Diagnosis not present

## 2021-11-26 DIAGNOSIS — I2510 Atherosclerotic heart disease of native coronary artery without angina pectoris: Secondary | ICD-10-CM | POA: Diagnosis not present

## 2021-11-26 DIAGNOSIS — R5383 Other fatigue: Secondary | ICD-10-CM | POA: Diagnosis present

## 2021-11-26 DIAGNOSIS — Z8546 Personal history of malignant neoplasm of prostate: Secondary | ICD-10-CM | POA: Diagnosis not present

## 2021-11-26 DIAGNOSIS — U071 COVID-19: Secondary | ICD-10-CM | POA: Insufficient documentation

## 2021-11-26 LAB — CBC WITH DIFFERENTIAL/PLATELET
Abs Immature Granulocytes: 0.02 10*3/uL (ref 0.00–0.07)
Basophils Absolute: 0 10*3/uL (ref 0.0–0.1)
Basophils Relative: 1 %
Eosinophils Absolute: 0.1 10*3/uL (ref 0.0–0.5)
Eosinophils Relative: 1 %
HCT: 35 % — ABNORMAL LOW (ref 39.0–52.0)
Hemoglobin: 12.3 g/dL — ABNORMAL LOW (ref 13.0–17.0)
Immature Granulocytes: 0 %
Lymphocytes Relative: 6 %
Lymphs Abs: 0.3 10*3/uL — ABNORMAL LOW (ref 0.7–4.0)
MCH: 31.4 pg (ref 26.0–34.0)
MCHC: 35.1 g/dL (ref 30.0–36.0)
MCV: 89.3 fL (ref 80.0–100.0)
Monocytes Absolute: 0.7 10*3/uL (ref 0.1–1.0)
Monocytes Relative: 14 %
Neutro Abs: 3.7 10*3/uL (ref 1.7–7.7)
Neutrophils Relative %: 78 %
Platelets: 141 10*3/uL — ABNORMAL LOW (ref 150–400)
RBC: 3.92 MIL/uL — ABNORMAL LOW (ref 4.22–5.81)
RDW: 13.3 % (ref 11.5–15.5)
WBC: 4.8 10*3/uL (ref 4.0–10.5)
nRBC: 0 % (ref 0.0–0.2)

## 2021-11-26 LAB — URINALYSIS, ROUTINE W REFLEX MICROSCOPIC
Bilirubin Urine: NEGATIVE
Glucose, UA: NEGATIVE mg/dL
Hgb urine dipstick: NEGATIVE
Ketones, ur: NEGATIVE mg/dL
Leukocytes,Ua: NEGATIVE
Nitrite: NEGATIVE
Protein, ur: NEGATIVE mg/dL
Specific Gravity, Urine: 1.015 (ref 1.005–1.030)
pH: 7 (ref 5.0–8.0)

## 2021-11-26 LAB — COMPREHENSIVE METABOLIC PANEL
ALT: 12 U/L (ref 0–44)
AST: 18 U/L (ref 15–41)
Albumin: 4.1 g/dL (ref 3.5–5.0)
Alkaline Phosphatase: 64 U/L (ref 38–126)
Anion gap: 7 (ref 5–15)
BUN: 16 mg/dL (ref 8–23)
CO2: 26 mmol/L (ref 22–32)
Calcium: 9 mg/dL (ref 8.9–10.3)
Chloride: 104 mmol/L (ref 98–111)
Creatinine, Ser: 1.24 mg/dL (ref 0.61–1.24)
GFR, Estimated: 58 mL/min — ABNORMAL LOW (ref >60)
Glucose, Bld: 118 mg/dL — ABNORMAL HIGH (ref 70–99)
Potassium: 4.1 mmol/L (ref 3.5–5.1)
Sodium: 137 mmol/L (ref 135–145)
Total Bilirubin: 0.6 mg/dL (ref 0.3–1.2)
Total Protein: 7.3 g/dL (ref 6.5–8.1)

## 2021-11-26 LAB — TROPONIN I (HIGH SENSITIVITY): Troponin I (High Sensitivity): 3 ng/L (ref <18)

## 2021-11-26 LAB — SARS CORONAVIRUS 2 BY RT PCR: SARS Coronavirus 2 by RT PCR: POSITIVE — AB

## 2021-11-26 MED ORDER — ACETAMINOPHEN 325 MG PO TABS
650.0000 mg | ORAL_TABLET | Freq: Once | ORAL | Status: AC
  Administered 2021-11-26: 650 mg via ORAL
  Filled 2021-11-26: qty 2

## 2021-11-26 MED ORDER — LACTATED RINGERS IV BOLUS
1000.0000 mL | Freq: Once | INTRAVENOUS | Status: AC
  Administered 2021-11-26: 1000 mL via INTRAVENOUS

## 2021-11-26 MED ORDER — ONDANSETRON HCL 4 MG PO TABS: 4.0000 mg | ORAL_TABLET | Freq: Four times a day (QID) | ORAL | 0 refills | Status: DC

## 2021-11-26 MED ORDER — PROCHLORPERAZINE EDISYLATE 10 MG/2ML IJ SOLN
10.0000 mg | Freq: Once | INTRAMUSCULAR | Status: AC
  Administered 2021-11-26: 10 mg via INTRAVENOUS
  Filled 2021-11-26: qty 2

## 2021-11-26 NOTE — ED Notes (Signed)
AMA/Waiver verbal acknowledgement, pt aware of MSE. Signature pad not  working

## 2021-11-26 NOTE — Discharge Instructions (Addendum)
You were diagnosed with COVID-19 today.  Fortunately, the rest your labs are very reassuring as well as your chest x-ray.  I am glad you are feeling better after the fluids and medicines today.  I have sent you in additional nausea medications that you can dissolve under your tongue should you feel nauseous.  If you develop another headache, you can take Tylenol over-the-counter.  Please remember to drink plenty of fluids as this will help prevent headaches and help your body heal.  If you develop worsening symptoms including difficulty breathing, chest tightness or severe fevers, feel free to return to the ED.

## 2021-11-26 NOTE — ED Provider Notes (Signed)
[facility] EMERGENCY DEPARTMENT Provider Note   CSN: [id number] Arrival date & time: 11/26/21  3428     History  Chief Complaint  Patient presents with   Cough    John Livingston is a 81 y.o. male with history of paroxysmal A-fib on Eliquis, prostate cancer status postradiation, CAD who presents to the emergency department with complaints of chest pain and generalized fatigue since yesterday.  Patient states that over the last couple of years, he intermittently has hot flashes which causes a burning sensation on the front side of his body that usually resolve fairly quickly.  Yesterday he experienced another hot flash described as a burning sensation extending from his chin down to his bellybutton, however it has not resolved and persisted into today.  He also endorses chills and mild congestion.  He states that he does not have a cough, however he has some postnasal drip in the back of his throat that he is purposely trying to cough up.  He has a history of prostate cancer status post radiation and does frequently have trouble with urination although last night developed dysuria.  Also endorses headache and nausea.  He denies sore throat, shortness of breath, chest tightness, abdominal pain, vomiting, diarrhea, dizziness, numbness and tingling.  Cough Associated symptoms: chest pain and headaches   Associated symptoms: no fever, no shortness of breath and no sore throat        Home Medications Prior to Admission medications   Medication Sig Start Date End Date Taking? Authorizing Provider  ondansetron (ZOFRAN) 4 MG tablet Take 1 tablet (4 mg total) by mouth every 6 (six) hours. 11/26/21  Yes Tonye Pearson, PA-C  acetaminophen (TYLENOL) 500 MG tablet Take 500 mg by mouth 3 (three) times daily as needed for headache (pain).    [provider]  albuterol (VENTOLIN HFA) 108 (90 Base) MCG/ACT inhaler Inhale 2 puffs into the lungs every 6 (six) hours as needed for wheezing  or shortness of breath. 01/23/21   Georgette Shell, MD  alfuzosin (UROXATRAL) 10 MG 24 hr tablet Take 1 tablet (10 mg total) by mouth daily with supper. 04/30/21   Croitoru, Mihai, MD  apixaban (ELIQUIS) 5 MG TABS tablet TAKE 1 TABLET BY MOUTH TWICE A DAY 10/24/21   Croitoru, Mihai, MD  atorvastatin (LIPITOR) 10 MG tablet TAKE 1 TABLET BY MOUTH EVERY DAY 08/16/21   [facility], Tami Lin, PA  brimonidine (ALPHAGAN) 0.2 % ophthalmic solution Place 1 drop into both eyes 3 (three) times daily. 02/28/19   [provider]  fludrocortisone (FLORINEF) 0.1 MG tablet Take one tablet 4 days a week (Tuesday, Thursday, Saturday and Sunday) Patient taking differently: Take 0.1 mg by mouth See admin instructions. Take one tablet (0.1 mg) by mouth 4 days a week (Tuesday, Thursday, Saturday and Sunday) 11/19/20   Croitoru, Mihai, MD  lipase/protease/amylase (CREON) 36000 UNITS CPEP capsule Take 36,000 Units by mouth daily with supper.    [provider]  Melatonin 3 MG CAPS Take 1 capsule (3 mg total) by mouth at bedtime. 01/23/21   Georgette Shell, MD  metoprolol succinate (TOPROL-XL) 25 MG 24 hr tablet Take 0.5 tablets (12.5 mg total) by mouth daily with supper. 08/29/21   Croitoru, Mihai, MD  omeprazole (PRILOSEC) 40 MG capsule Take 40 mg by mouth every morning.    [provider]  saccharomyces boulardii (FLORASTOR) 250 MG capsule Take 1 capsule (250 mg total) by mouth 2 (two) times daily. 01/23/21  Georgette Shell, MD  traZODone (DESYREL) 50 MG tablet Take 1 tablet (50 mg total) by mouth at bedtime. 01/23/21   Georgette Shell, MD      Allergies    Patient has no known allergies.    Review of Systems   Review of Systems  Constitutional:  Negative for fever.  HENT:  Positive for congestion. Negative for sore throat.   Respiratory:  Positive for cough. Negative for shortness of breath.   Cardiovascular:  Positive for chest pain.  Gastrointestinal:  Positive for nausea.  Negative for abdominal pain, diarrhea and vomiting.  Genitourinary:  Positive for dysuria.  Neurological:  Positive for headaches. Negative for numbness.    Physical Exam Updated Vital Signs BP (!) 154/74   Pulse 71   Temp 98.3 F (36.8 C) (Oral)   Resp 19   Ht 5' 9.5" (1.765 m)   Wt 92.5 kg   SpO2 95%   BMI 29.69 kg/m  Physical Exam Vitals and nursing note reviewed.  Constitutional:      General: He is not in acute distress.    Appearance: He is ill-appearing. He is not toxic-appearing.  HENT:     Head: Atraumatic.  Eyes:     Conjunctiva/sclera: Conjunctivae normal.  Cardiovascular:     Rate and Rhythm: Normal rate and regular rhythm.     Pulses: Normal pulses.          Radial pulses are 2+ on the right side and 2+ on the left side.       Dorsalis pedis pulses are 2+ on the right side and 2+ on the left side.     Heart sounds: No murmur heard. Pulmonary:     Effort: Pulmonary effort is normal. No respiratory distress.     Breath sounds: Normal breath sounds.  Chest:     Chest wall: No tenderness.  Abdominal:     General: Abdomen is flat. There is no distension.     Palpations: Abdomen is soft.     Tenderness: There is no abdominal tenderness.  Musculoskeletal:        General: Normal range of motion.     Cervical back: Normal range of motion.     Right lower leg: No edema.     Left lower leg: No edema.  Skin:    General: Skin is warm and dry.     Capillary Refill: Capillary refill takes less than 2 seconds.  Neurological:     General: No focal deficit present.     Mental Status: He is alert.  Psychiatric:        Mood and Affect: Mood normal.     ED Results / Procedures / Treatments   Labs (all labs ordered are listed, but only abnormal results are displayed) Labs Reviewed  SARS CORONAVIRUS 2 BY RT PCR - Abnormal; Notable for the following components:      Result Value   SARS Coronavirus 2 by RT PCR POSITIVE (*)    All other components within normal  limits  COMPREHENSIVE METABOLIC PANEL - Abnormal; Notable for the following components:   Glucose, Bld 118 (*)    GFR, Estimated 58 (*)    All other components within normal limits  CBC WITH DIFFERENTIAL/PLATELET - Abnormal; Notable for the following components:   RBC 3.92 (*)    Hemoglobin 12.3 (*)    HCT 35.0 (*)    Platelets 141 (*)    Lymphs Abs 0.3 (*)    All other components  within normal limits  URINALYSIS, ROUTINE W REFLEX MICROSCOPIC  TROPONIN I (HIGH SENSITIVITY)    EKG EKG Interpretation  Date/Time:  Saturday November 26 2021 08:49:45 EDT Ventricular Rate:  78 PR Interval:  176 QRS Duration: 107 QT Interval:  389 QTC Calculation: 444 R Axis:   83 Text Interpretation: Sinus rhythm Borderline right axis deviation no acute ST/T changes no significant change since 2022 Confirmed by Sherwood Gambler [phone]) on 11/26/2021 9:03:34 AM  Radiology DG Chest Portable 1 View  Result Date: 11/26/2021 CLINICAL DATA:  Chest pain EXAM: PORTABLE CHEST 1 VIEW COMPARISON:  01/22/2021 FINDINGS: Patchy reticular opacity in the left lower lung, chronic based on more remote imaging. Interval clearance of airspace disease seen on the right at 2022 comparison. Normal heart size and mediastinal contours. Artifact from EKG leads. IMPRESSION: Scar-like appearance in the left lower lung.  No acute finding. Electronically Signed   By: Jorje Guild M.D.   On: 11/26/2021 09:31    Procedures Procedures    Medications Ordered in ED Medications  lactated ringers bolus 1,000 mL (1,000 mLs Intravenous New Bag/Given 11/26/21 0936)  acetaminophen (TYLENOL) tablet 650 mg (650 mg Oral Given 11/26/21 0924)  prochlorperazine (COMPAZINE) injection 10 mg (10 mg Intravenous Given 11/26/21 8101)    ED Course/ Medical Decision Making/ A&P                           Medical Decision Making Amount and/or Complexity of Data Reviewed Labs: ordered. Radiology: ordered.  Risk OTC drugs. Prescription drug  management.   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    Comment: [employer]  Tobacco Use   Smoking status: Former    Packs/day: 1.00    Years: 20.00    Total pack years: 20.00    Types: Cigarettes    Quit date: 03/20/1978    Years since quitting: 43.7   Smokeless tobacco: Never  Vaping Use   Vaping Use: Never used  Substance and Sexual Activity   Alcohol use: Not Currently    Alcohol/week: 1.0 standard drink of alcohol    Types: 1 Cans of beer per week   Drug use: No   Sexual activity: Not on file  Other Topics Concern   Not on file  Social History Narrative   Not on file   Social Determinants of Health   Financial Resource Strain: Not on file  Food Insecurity: Not on file  Transportation Needs: Not on file  Physical Activity: Not on file  Stress: Not on file  Social Connections: Not on file  Intimate Partner Violence: Not on file     Initial impression:  This patient presents to the ED for concern of numerous complaints including chest pain, headache, congestion and global fatigue, this involves an extensive number of treatment options, and is a complaint that carries with it a high risk of complications and morbidity.   Differentials include ACS, viral infection, COVID, sepsis, UTI,  Comorbidities affecting care:  CAD, A-fib  Additional history obtained: Wife, cardiology review  Lab Tests  I Ordered, reviewed, and interpreted labs and EKG.  The pertinent results include:  Troponin 3 No leukocytosis CMP normal  UA negative Positive COVID  Imaging Studies ordered:  I ordered imaging studies including  Chest x-ray without acute findings, residual chronic left lung scarring I independently visualized and  interpreted imaging and I agree with the radiologist interpretation.   EKG: Normal sinus rhythm  Medicines ordered and prescription drug management:  I ordered medication including: 1 L LR bolus Compazine 10 mg Tylenol 650 mg Reevaluation of the patient after these medicines showed that the patient resolved I have reviewed the patients home medicines and have made adjustments as needed   ED Course/Re-evaluation: Patient is ill-appearing although nontoxic.  Vitals are without significant abnormality.  He is satting at 96% on room air and speaking in full and complete sentences.  Physical exam overall benign.  Given vague complaints of chest pain described as burning from chin to bellybutton, I did obtain cardiac work-up in addition to COVID test. Given 12 plus hours of symptom onset and troponin of 3, discontinued second troponin.  Labs overall very reassuring without leukocytosis, electrolyte abnormality or urinary tract infection.  He is COVID-positive.  We discussed use of Paxlovid including risks and benefits.  Patient and wife would prefer not to proceed with Paxlovid and would prefer to treat symptomatically at home with fluids and Tylenol as needed.  He does not have any shortness of breath, so inhaler was not provided.  Prescription Zofran sent for nausea.  Return precautions discussed.  Discharged home in stable condition.  Disposition:  After consideration of the diagnostic results, physical exam, history and the patients response to treatment feel that the patent would benefit from discharge.   COVID 19: Plan and management as described above. Discharged home in good condition.  Final Clinical Impression(s) / ED Diagnoses Final diagnoses:  [id number]    Rx / DC Orders ED Discharge Orders          Ordered    ondansetron (ZOFRAN) 4 MG tablet  Every 6 hours        11/26/21 1057              Tonye Pearson, [place] 11/26/21 1102    Sherwood Gambler, MD 11/27/21  [phone]

## 2021-11-26 NOTE — ED Triage Notes (Signed)
Pt arrives pov, slow gait, c/o HA, cough, congestion and fatigue since yesterday. Wife reports pt c/o CP with bilateral radiation into upper extremities this am. Also endorses mild dysuria starting last night Pt AOx4, speech clear

## 2022-01-10 NOTE — Progress Notes (Unsigned)
Cardiology Clinic Note   Patient Name: John Livingston Date of Encounter: 01/12/2022  Primary Care Provider:  Milana Na., MD Primary Cardiologist:  Sanda Klein, MD  Patient Profile    John Livingston 81 year old male presents to clinic today for follow-up evaluation of his paroxysmal atrial fibrillation, coronary artery disease, and HTN.  Past Medical History    Past Medical History:  Diagnosis Date   Baker's cyst of knee    left   Cancer of appendix ([place])    GERD (gastroesophageal reflux disease)    History of kidney stones    Hypertension    Moderate persistent asthma    pt. denies   Prostate cancer ([place])    Ureteral stone    Past Surgical History:  Procedure Laterality Date   APPENDECTOMY     cartilage surgery in nose     CHOLECYSTECTOMY N/A 03/06/2019   Procedure: LAPAROSCOPIC CHOLECYSTECTOMY;  Surgeon: Coralie Keens, MD;  Location: [facility];  Service: General;  Laterality: N/A;   IR EXCHANGE BILIARY DRAIN  02/21/2019   IR PERC CHOLECYSTOSTOMY  01/17/2019   IR RADIOLOGIST EVAL & MGMT  02/19/2019    Allergies  No Known Allergies  History of Present Illness    John Livingston is a PMH of paroxysmal atrial fibrillation HTN, aortic atherosclerosis, CAD, moderate persistent asthma, dysphagia, GERD, AKI, hypokalemia, and microcytic anemia.  His PMH also includes symptomatic hypotension with presyncope.  He is anticoagulated with apixaban.  He was started on Florinef to increase his blood pressure previously.  His PYP scan was negative for amyloidosis.  He was seen in follow-up by Doreene Adas PA-C on 02/04/2021.  His blood pressure at that time was 172/86.  He was taking 12.5 mg of metoprolol and reported that he continued to take 0.1 mg of Florinef Tuesday Thursday Saturday Sunday.  He denied episodes of dizziness.  During his previous clinic visit his blood pressure was 119/62.  He was asked to hold his Florinef for 1 week and maintain blood pressure  log.  He presented to the emergency department on 11/26/2021.  He had complaints of chest pain and generalized fatigue since the previous day.  He reported that over the last couple years he had intermittent hot flashes which cause burning sensation on both sides of his body which usually resolve fairly quickly.  He reported that previously he had experienced an episode of hot flash/burning sensation from his chin down to his bellybutton.  However, his symptoms persisted.  He noted chills and mild congestion.  He denied cough.  He reported postnasal drip.  His oxygen saturation was 96% on room air.  He was noted to be positive for COVID-19.  Treatment options were discussed.  He wished to defer Paxlovid.  Symptom management was chosen as course of treatment.  He was prescribed Zofran for nausea and discharged home in stable condition.   He presents to the clinic today for follow-up evaluation states he feels well.  We reviewed his September emergency department visit.  He and his wife expressed understanding.  His main complaint today is palpitations when he would lay down at night.  He does not wish to have an evaluation for OSA.  His wife reports that he does snore.  She occasionally notices that he will stop breathing through the night.  He does note that when he falls asleep on his back he will wake up and gets up several times through the night to urinate.  He  had an evaluation at his PCP who instructed him to take a half of metoprolol for sustained palpitations.  I agree with this.  We reviewed triggers for palpitations and I will get the sleep hygiene instructions.  We reviewed his previous echocardiogram.  He continues to work 6 days/week running at his [employer].  He has been eating a heart healthy diet.  I did recommend that if he has palpitations more frequently through the day that he may need a cardiac event monitor.  He expressed understanding.  I will give sleep hygiene instructions, have him  maintain his physical activity, continue current medication regimen, and plan follow-up in 6 months.  Today he denies chest pain, shortness of breath, lower extremity edema, fatigue, melena, hematuria, hemoptysis, diaphoresis, weakness, presyncope, syncope, orthopnea, and PND.   Home Medications    Prior to Admission medications   Medication Sig Start Date End Date Taking? Authorizing Provider  acetaminophen (TYLENOL) 500 MG tablet Take 500 mg by mouth 3 (three) times daily as needed for headache (pain).    [provider]  albuterol (VENTOLIN HFA) 108 (90 Base) MCG/ACT inhaler Inhale 2 puffs into the lungs every 6 (six) hours as needed for wheezing or shortness of breath. 01/23/21   Georgette Shell, MD  alfuzosin (UROXATRAL) 10 MG 24 hr tablet Take 1 tablet (10 mg total) by mouth daily with supper. 04/30/21   Croitoru, Mihai, MD  apixaban (ELIQUIS) 5 MG TABS tablet TAKE 1 TABLET BY MOUTH TWICE A DAY 10/24/21   Croitoru, Mihai, MD  atorvastatin (LIPITOR) 10 MG tablet TAKE 1 TABLET BY MOUTH EVERY DAY 08/16/21   [facility], Tami Lin, PA  brimonidine (ALPHAGAN) 0.2 % ophthalmic solution Place 1 drop into both eyes 3 (three) times daily. 02/28/19   [provider]  fludrocortisone (FLORINEF) 0.1 MG tablet Take one tablet 4 days a week (Tuesday, Thursday, Saturday and Sunday) Patient taking differently: Take 0.1 mg by mouth See admin instructions. Take one tablet (0.1 mg) by mouth 4 days a week (Tuesday, Thursday, Saturday and Sunday) 11/19/20   Croitoru, Mihai, MD  lipase/protease/amylase (CREON) 36000 UNITS CPEP capsule Take 36,000 Units by mouth daily with supper.    [provider]  Melatonin 3 MG CAPS Take 1 capsule (3 mg total) by mouth at bedtime. 01/23/21   Georgette Shell, MD  metoprolol succinate (TOPROL-XL) 25 MG 24 hr tablet Take 0.5 tablets (12.5 mg total) by mouth daily with supper. 08/29/21   Croitoru, Mihai, MD  omeprazole (PRILOSEC) 40 MG capsule Take 40 mg  by mouth every morning.    [provider]  ondansetron (ZOFRAN) 4 MG tablet Take 1 tablet (4 mg total) by mouth every 6 (six) hours. 11/26/21   Tonye Pearson, PA-C  saccharomyces boulardii (FLORASTOR) 250 MG capsule Take 1 capsule (250 mg total) by mouth 2 (two) times daily. 01/23/21   Georgette Shell, MD  traZODone (DESYREL) 50 MG tablet Take 1 tablet (50 mg total) by mouth at bedtime. 01/23/21   Georgette Shell, MD    Family History    Family History  Problem Relation Age of Onset   Asthma Son        as a child   Heart disease Mother    Prostate cancer Father    He indicated that the status of his mother is unknown. He indicated that the status of his father is unknown. He indicated that the status of his son is unknown.  Social History  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    Comment: [employer]  Tobacco Use   Smoking status: Former    Packs/day: 1.00    Years: 20.00    Total pack years: 20.00    Types: Cigarettes    Quit date: 03/20/1978    Years since quitting: 43.8   Smokeless tobacco: Never  Vaping Use   Vaping Use: Never used  Substance and Sexual Activity   Alcohol use: Not Currently    Alcohol/week: 1.0 standard drink of alcohol    Types: 1 Cans of beer per week   Drug use: No   Sexual activity: Not on file  Other Topics Concern   Not on file  Social History Narrative   Not on file   Social Determinants of Health   Financial Resource Strain: Not on file  Food Insecurity: Not on file  Transportation Needs: Not on file  Physical Activity: Not on file  Stress: Not on file  Social Connections: Not on file  Intimate Partner Violence: Not on file     Review of Systems    General:  No chills, fever, night sweats or  weight changes.  Cardiovascular:  No chest pain, dyspnea on exertion, edema, orthopnea, palpitations, paroxysmal nocturnal dyspnea. Dermatological: No rash, lesions/masses Respiratory: No cough, dyspnea Urologic: No hematuria, dysuria Abdominal:   No nausea, vomiting, diarrhea, bright red blood per rectum, melena, or hematemesis Neurologic:  No visual changes, wkns, changes in mental status. All other systems reviewed and are otherwise negative except as noted above.  Physical Exam    VS:  BP 138/64   Pulse 77   Ht 5' 9.5" (1.765 m)   Wt 196 lb (88.9 kg)   SpO2 96%   BMI 28.53 kg/m  , BMI Body mass index is 28.53 kg/m. GEN: Well nourished, well developed, in no acute distress. HEENT: normal. Neck: Supple, no JVD, carotid bruits, or masses. Cardiac: RRR, no murmurs, rubs, or gallops. No clubbing, cyanosis, edema.  Radials/DP/PT 2+ and equal bilaterally.  Respiratory:  Respirations regular and unlabored, clear to auscultation bilaterally. GI: Soft, nontender, nondistended, BS + x 4. MS: no deformity or atrophy. Skin: warm and dry, no rash. Neuro:  Strength and sensation are intact. Psych: Normal affect.  Accessory Clinical Findings    Recent Labs: 01/22/2021: B Natriuretic Peptide 46.1 01/23/2021: Magnesium 2.0 11/26/2021: ALT 12; BUN 16; Creatinine, Ser 1.24; Hemoglobin 12.3; Platelets 141; Potassium 4.1; Sodium 137   Recent Lipid Panel    Component Value Date/Time   CHOL 175 05/01/2019 0846   TRIG 90 05/01/2019 0846   HDL 37 (L) 05/01/2019 0846   CHOLHDL 4.7 05/01/2019 0846   LDLCALC 121 (H) 05/01/2019 0846         ECG personally reviewed by me today-none today.  PYP 2022   By semi-quantitative assessment scan is consistent with no increased heart uptake-Grade 0. Heart to contralateral lung ratio is between 1-1.5, indeterminate for amyloid.   Study is equivocal for TTR amyloidosis (visual score of 1/ratio between 1-1.5).   Prior study not available for comparison.    Visual grade 0 at 3 hours (less than rib), but H/CL 1.2.  Overall, findings are inconsistent with TTR amyloidosis.  Assessment & Plan  1.  PAF-does note palpitations with laying down at night.  No palpitations through the day or with increased physical activity.  Chest Vascor 4 (age, hypertension, aortic atherosclerosis).  Reports compliance with apixaban and denies bleeding issues.  Metoprolol discontinued due to side effects.  Offered OSA evaluation.  Wishes to defer. Continue apixaban Heart healthy low-sodium diet-salty 6 given Increase physical activity as tolerated Avoid triggers caffeine, chocolate, EtOH, dehydration etc.  Essential hypertension-BP today 138/64.  Continue to be somewhat labile at home.  Previously was taking Florinef for symptomatic hypotension.  Previously underwent PYP scan 2022 to rule out cardiac amyloidosis due to symptomatic hypotension.  PYP was negative. Continue with metoprolol Heart healthy low-sodium diet-salty 6 given Increase physical activity as tolerated  Aortic atherosclerosis-compliant with statin therapy.  Denies episodes of arm neck back or chest discomfort. Continue atorvastatin Heart healthy low-sodium high-fiber diet . Increase physical activity as tolerated  COVID-19 infection-presented to the emergency department 11/26/2021 with chest discomfort and episodes of hot flashes.  High-sensitivity troponin 3 and not redrawn..  CXR showed no acute findings CMP, CBC stable.  COVID-positive.  Patient was offered Paxlovid but chose symptomatic treatment.  Continues to slowly increase physical activity.  Notes some fatigue.  Denies further episodes of hot flashes/burning sensation. Continue with symptom management Follow-up with PCP  Sleep disturbance-notices frequent urination.  Wakes up several times during the night.  May be having OSA but wishes to defer evaluation.  States that he would not wear CPAP device. Sleep hygiene instructions given Elevate  head of bed Avoid supine sleeping  Disposition: Follow-up with Dr. Sallyanne Kuster or me in 4-6 months.  Jossie Ng. Jesselyn Rask NP-C     01/12/2022, 10:04 AM [place] [address] Office [phone] Fax [phone]  Notice: This dictation was prepared with Dragon dictation along with smaller phrase technology. Any transcriptional errors that result from this process are unintentional and may not be corrected upon review.  I spent 14 minutes examining this patient, reviewing medications, and using patient centered shared decision making involving her cardiac care.  Prior to her visit I spent greater than 20 minutes reviewing her past medical history,  medications, and prior cardiac tests.

## 2022-01-12 ENCOUNTER — Encounter: Payer: Self-pay | Admitting: General Practice

## 2022-01-12 ENCOUNTER — Ambulatory Visit: Payer: Medicare Other | Attending: General Practice | Admitting: General Practice

## 2022-01-12 VITALS — BP 138/64 | HR 77 | Ht 69.5 in | Wt 196.0 lb

## 2022-01-12 DIAGNOSIS — I700 Atherosclerosis of aorta: Secondary | ICD-10-CM

## 2022-01-12 DIAGNOSIS — Z8616 Personal history of COVID-19: Secondary | ICD-10-CM | POA: Insufficient documentation

## 2022-01-12 DIAGNOSIS — I10 Essential (primary) hypertension: Secondary | ICD-10-CM | POA: Diagnosis not present

## 2022-01-12 DIAGNOSIS — I480 Paroxysmal atrial fibrillation: Secondary | ICD-10-CM | POA: Diagnosis present

## 2022-01-12 NOTE — Patient Instructions (Addendum)
Medication Instructions:  The current medical regimen is effective;  continue present plan and medications as directed. Please refer to the Current Medication list given to you today.  *If you need a refill on your cardiac medications before your next appointment, please call your pharmacy*  Lab Work: NONE  Testing/Procedures: NONE  Follow-Up: At [facility], you and your health needs are our priority.  As part of our continuing mission to provide you with exceptional heart care, we have created designated Provider Care Teams.  These Care Teams include your primary Cardiologist (physician) and Advanced Practice Providers (APPs -  Physician Assistants and Nurse Practitioners) who all work together to provide you with the care you need, when you need it.  Your next appointment:   6 month(s)  The format for your next appointment:   In Person  Provider:   Sanda Klein, MD    Other Instructions Please try to avoid these triggers: Do not use any products that have nicotine or tobacco in them. These include cigarettes, e-cigarettes, and chewing tobacco. If you need help quitting, ask your doctor. Eat heart-healthy foods. Talk with your doctor about the right eating plan for you. Exercise regularly as told by your doctor. Stay hydrated Do not drink alcohol, Caffeine or chocolate. Lose weight if you are overweight. Do not use drugs, including cannabis   Quality Sleep Information, Adult Quality sleep is important for your mental and physical health. It also improves your quality of life. Quality sleep means you: Are asleep for most of the time you are in bed. Fall asleep within 30 minutes. Wake up no more than once a night. Are awake for no longer than 20 minutes if you do wake up during the night. Most adults need 7-8 hours of quality sleep each night. How can poor sleep affect me? If you do not get enough quality sleep, you may have: Mood swings. Daytime  sleepiness. Decreased alertness, reaction time, and concentration. Sleep disorders, such as insomnia and sleep apnea. Difficulty with: Solving problems. Coping with stress. Paying attention. These issues may affect your performance and productivity at work, school, and home. Lack of sleep may also put you at higher risk for accidents, suicide, and risky behaviors. If you do not get quality sleep, you may also be at higher risk for several health problems, including: Infections. Type 2 diabetes. Heart disease. High blood pressure. Obesity. Worsening of long-term conditions, like arthritis, kidney disease, depression, Parkinson's disease, and epilepsy. What actions can I take to get more quality sleep? Sleep schedule and routine Stick to a sleep schedule. Go to sleep and wake up at about the same time each day. Do not try to sleep less on weekdays and make up for lost sleep on weekends. This does not work. Limit naps during the day to 30 minutes or less. Do not take naps in the late afternoon. Make time to relax before bed. Reading, listening to music, or taking a hot bath promotes quality sleep. Make your bedroom a place that promotes quality sleep. Keep your bedroom dark, quiet, and at a comfortable room temperature. Make sure your bed is comfortable. Avoid using electronic devices that give off bright blue light for 30 minutes before bedtime. Your brain perceives bright blue light as sunlight. This includes television, phones, and computers. If you are lying awake in bed for longer than 20 minutes, get up and do a relaxing activity until you feel sleepy. Lifestyle     Try to get at least  30 minutes of exercise on most days. Do not exercise 2-3 hours before going to bed. Do not use any products that contain nicotine or tobacco. These products include cigarettes, chewing tobacco, and vaping devices, such as e-cigarettes. If you need help quitting, ask your health care provider. Do not  drink caffeinated beverages for at least 8 hours before going to bed. Coffee, tea, and some sodas contain caffeine. Do not drink alcohol or eat large meals close to bedtime. Try to get at least 30 minutes of sunlight every day. Morning sunlight is best. Medical concerns Work with your health care provider to treat medical conditions that may affect sleeping, such as: Nasal obstruction. Snoring. Sleep apnea and other sleep disorders. Talk to your health care provider if you think any of your prescription medicines may cause you to have difficulty falling or staying asleep. If you have sleep problems, talk with a sleep consultant. If you think you have a sleep disorder, talk with your health care provider about getting evaluated by a specialist. Where to find more information Sleep Foundation: sleepfoundation.org American Academy of Sleep Medicine: aasm.org Centers for Disease Control and Prevention (CDC): StoreMirror.com.cy Contact a health care provider if: You have trouble getting to sleep or staying asleep. You often wake up very early in the morning and cannot get back to sleep. You have daytime sleepiness. You have daytime sleep attacks of suddenly falling asleep and sudden muscle weakness (narcolepsy). You have a tingling sensation in your legs with a strong urge to move your legs (restless legs syndrome). You stop breathing briefly during sleep (sleep apnea). You think you have a sleep disorder or are taking a medicine that is affecting your quality of sleep. Summary Most adults need 7-8 hours of quality sleep each night. Getting enough quality sleep is important for your mental and physical health. Make your bedroom a place that promotes quality sleep, and avoid things that may cause you to have poor sleep, such as alcohol, caffeine, smoking, or large meals. Talk to your health care provider if you have trouble falling asleep or staying asleep. This information is not intended to replace  advice given to you by your health care provider. Make sure you discuss any questions you have with your health care provider. Document Revised: 06/29/2021 Document Reviewed: 06/29/2021 Elsevier Patient Education  [place]

## 2022-05-10 ENCOUNTER — Emergency Department (HOSPITAL_BASED_OUTPATIENT_CLINIC_OR_DEPARTMENT_OTHER): Payer: Medicare Other

## 2022-05-10 ENCOUNTER — Emergency Department (HOSPITAL_BASED_OUTPATIENT_CLINIC_OR_DEPARTMENT_OTHER)
Admission: EM | Admit: 2022-05-10 | Discharge: 2022-05-10 | Disposition: A | Discharge status: Home / Self Care | Payer: Medicare Other | Attending: Emergency Medicine | Admitting: Emergency Medicine

## 2022-05-10 ENCOUNTER — Encounter (HOSPITAL_BASED_OUTPATIENT_CLINIC_OR_DEPARTMENT_OTHER): Payer: Self-pay

## 2022-05-10 ENCOUNTER — Other Ambulatory Visit: Payer: Self-pay

## 2022-05-10 DIAGNOSIS — J45909 Unspecified asthma, uncomplicated: Secondary | ICD-10-CM | POA: Insufficient documentation

## 2022-05-10 DIAGNOSIS — I480 Paroxysmal atrial fibrillation: Secondary | ICD-10-CM | POA: Diagnosis not present

## 2022-05-10 DIAGNOSIS — I2510 Atherosclerotic heart disease of native coronary artery without angina pectoris: Secondary | ICD-10-CM | POA: Insufficient documentation

## 2022-05-10 DIAGNOSIS — Z79899 Other long term (current) drug therapy: Secondary | ICD-10-CM | POA: Diagnosis not present

## 2022-05-10 DIAGNOSIS — Z7901 Long term (current) use of anticoagulants: Secondary | ICD-10-CM | POA: Diagnosis not present

## 2022-05-10 DIAGNOSIS — R55 Syncope and collapse: Secondary | ICD-10-CM | POA: Insufficient documentation

## 2022-05-10 DIAGNOSIS — I10 Essential (primary) hypertension: Secondary | ICD-10-CM | POA: Insufficient documentation

## 2022-05-10 LAB — COMPREHENSIVE METABOLIC PANEL
ALT: 14 U/L (ref 0–44)
AST: 26 U/L (ref 15–41)
Albumin: 4.1 g/dL (ref 3.5–5.0)
Alkaline Phosphatase: 56 U/L (ref 38–126)
Anion gap: 10 (ref 5–15)
BUN: 19 mg/dL (ref 8–23)
CO2: 22 mmol/L (ref 22–32)
Calcium: 8.9 mg/dL (ref 8.9–10.3)
Chloride: 105 mmol/L (ref 98–111)
Creatinine, Ser: 1.2 mg/dL (ref 0.61–1.24)
GFR, Estimated: >60 mL/min (ref >60)
Glucose, Bld: 135 mg/dL — ABNORMAL HIGH (ref 70–99)
Potassium: 4.2 mmol/L (ref 3.5–5.1)
Sodium: 137 mmol/L (ref 135–145)
Total Bilirubin: 1.4 mg/dL — ABNORMAL HIGH (ref 0.3–1.2)
Total Protein: 7.5 g/dL (ref 6.5–8.1)

## 2022-05-10 LAB — CBC WITH DIFFERENTIAL/PLATELET
Abs Immature Granulocytes: 0.01 10*3/uL (ref 0.00–0.07)
Basophils Absolute: 0.1 10*3/uL (ref 0.0–0.1)
Basophils Relative: 1 %
Eosinophils Absolute: 0.2 10*3/uL (ref 0.0–0.5)
Eosinophils Relative: 3 %
HCT: 39.5 % (ref 39.0–52.0)
Hemoglobin: 13.8 g/dL (ref 13.0–17.0)
Immature Granulocytes: 0 %
Lymphocytes Relative: 26 %
Lymphs Abs: 1.7 10*3/uL (ref 0.7–4.0)
MCH: 31.4 pg (ref 26.0–34.0)
MCHC: 34.9 g/dL (ref 30.0–36.0)
MCV: 89.8 fL (ref 80.0–100.0)
Monocytes Absolute: 0.9 10*3/uL (ref 0.1–1.0)
Monocytes Relative: 14 %
Neutro Abs: 3.7 10*3/uL (ref 1.7–7.7)
Neutrophils Relative %: 56 %
Platelets: 184 10*3/uL (ref 150–400)
RBC: 4.4 MIL/uL (ref 4.22–5.81)
RDW: 14 % (ref 11.5–15.5)
WBC: 6.6 10*3/uL (ref 4.0–10.5)
nRBC: 0 % (ref 0.0–0.2)

## 2022-05-10 LAB — CBG MONITORING, ED: Glucose-Capillary: 138 mg/dL — ABNORMAL HIGH (ref 70–99)

## 2022-05-10 LAB — TROPONIN I (HIGH SENSITIVITY)
Troponin I (High Sensitivity): 6 ng/L (ref <18)
Troponin I (High Sensitivity): 6 ng/L (ref <18)

## 2022-05-10 MED ORDER — SODIUM CHLORIDE 0.9 % IV BOLUS
500.0000 mL | Freq: Once | INTRAVENOUS | Status: AC
  Administered 2022-05-10: 500 mL via INTRAVENOUS

## 2022-05-10 NOTE — ED Triage Notes (Signed)
Pt woke up at 0400 to turn on the heater and states that when he stood up he saw black and felt like he had to pass out. Pt got weak and is reporting CP and dyspnea. Pt denies other sx; has hx of afib denies hx of MI.

## 2022-05-10 NOTE — ED Provider Notes (Signed)
[facility] Provider Note   CSN: [id number] Arrival date & time: 05/10/22  [id number]     History  Chief Complaint  Patient presents with   Chest Pain   Near Syncope   Weakness    John Livingston is a 82 y.o. male.  Pt is a 82 yo male with pmhx significant for afib (on eliquis), htn, aortic atherosclerosis, CAD, asthma, dysphagia, GERD, and anemia.  Pt does have a hx of symptomatic hypotension with presyncope on florinef in the past (no longer on it).  He is no longer on the metoprolol as it caused too many side effects. Pt reports compliance with his other meds.  He woke up this am around 0400 to turn up the heater in his bedroom.  He felt like he was going to pass out when he stood up.  He said his vision turned black for a few seconds.  He went back to bed and got back up around 0630.  He said he still did not feel well, so came in for eval.  Pt had some chest tightness, but no current pain.  Pt was in afib when he first arrived, but shortly afterwards, converted to NSR.  He feels better now. Pt said he felt well yesterday.          Home Medications Prior to Admission medications   Medication Sig Start Date End Date Taking? Authorizing Provider  acetaminophen (TYLENOL) 500 MG tablet Take 500 mg by mouth 3 (three) times daily as needed for headache (pain).    [provider]  albuterol (VENTOLIN HFA) 108 (90 Base) MCG/ACT inhaler Inhale 2 puffs into the lungs every 6 (six) hours as needed for wheezing or shortness of breath. 01/23/21   Georgette Shell, MD  alfuzosin (UROXATRAL) 10 MG 24 hr tablet Take 1 tablet (10 mg total) by mouth daily with supper. 04/30/21   Croitoru, Mihai, MD  apixaban (ELIQUIS) 5 MG TABS tablet TAKE 1 TABLET BY MOUTH TWICE A DAY 10/24/21   Croitoru, Mihai, MD  atorvastatin (LIPITOR) 10 MG tablet TAKE 1 TABLET BY MOUTH EVERY DAY 08/16/21   [facility], Tami Lin, PA  brimonidine (ALPHAGAN) 0.2 % ophthalmic  solution Place 1 drop into both eyes 3 (three) times daily. 02/28/19   [provider]  finasteride (PROSCAR) 5 MG tablet Take 5 mg by mouth daily. 12/24/21   [provider]  lipase/protease/amylase (CREON) 36000 UNITS CPEP capsule Take 36,000 Units by mouth daily with supper.    [provider]  metoprolol succinate (TOPROL-XL) 25 MG 24 hr tablet Take 0.5 tablets (12.5 mg total) by mouth daily with supper. 08/29/21   Croitoru, Mihai, MD  omeprazole (PRILOSEC) 40 MG capsule Take 40 mg by mouth every morning.    [provider]  ondansetron (ZOFRAN) 4 MG tablet Take 1 tablet (4 mg total) by mouth every 6 (six) hours. 11/26/21   Tonye Pearson, PA-C  saccharomyces boulardii (FLORASTOR) 250 MG capsule Take 1 capsule (250 mg total) by mouth 2 (two) times daily. 01/23/21   Georgette Shell, MD  traZODone (DESYREL) 50 MG tablet Take 1 tablet (50 mg total) by mouth at bedtime. 01/23/21   Georgette Shell, MD      Allergies    Patient has no known allergies.    Review of Systems   Review of Systems  Cardiovascular:  Positive for chest pain.  All other systems reviewed and are negative.   Physical  Exam Updated Vital Signs BP 98/66   Pulse 76   Temp (!) 97.4 F (36.3 C) (Oral)   Resp 17   Ht 5' 9"$  (1.753 m)   Wt 87.5 kg   SpO2 96%   BMI 28.50 kg/m  Physical Exam Vitals and nursing note reviewed.  Constitutional:      Appearance: He is well-developed.  HENT:     Head: Normocephalic and atraumatic.  Eyes:     Extraocular Movements: Extraocular movements intact.     Pupils: Pupils are equal, round, and reactive to light.  Cardiovascular:     Rate and Rhythm: Normal rate and regular rhythm.     Heart sounds: Normal heart sounds.  Pulmonary:     Effort: Pulmonary effort is normal.     Breath sounds: Normal breath sounds.  Abdominal:     General: Bowel sounds are normal.     Palpations: Abdomen is soft.  Musculoskeletal:        General:  Normal range of motion.     Cervical back: Normal range of motion and neck supple.  Skin:    General: Skin is warm.     Capillary Refill: Capillary refill takes less than 2 seconds.  Neurological:     General: No focal deficit present.     Mental Status: He is alert and oriented to person, place, and time.  Psychiatric:        Mood and Affect: Mood normal.        Behavior: Behavior normal.     ED Results / Procedures / Treatments   Labs (all labs ordered are listed, but only abnormal results are displayed) Labs Reviewed  COMPREHENSIVE METABOLIC PANEL - Abnormal; Notable for the following components:      Result Value   Glucose, Bld 135 (*)    Total Bilirubin 1.4 (*)    All other components within normal limits  CBG MONITORING, ED - Abnormal; Notable for the following components:   Glucose-Capillary 138 (*)    All other components within normal limits  CBC WITH DIFFERENTIAL/PLATELET  URINALYSIS, ROUTINE W REFLEX MICROSCOPIC  TROPONIN I (HIGH SENSITIVITY)  TROPONIN I (HIGH SENSITIVITY)    EKG EKG Interpretation  Date/Time:  Wednesday May 10 2022 07:02:05 EST Ventricular Rate:  87 PR Interval:  194 QRS Duration: 110 QT Interval:  400 QTC Calculation: 482 R Axis:   87 Text Interpretation: Sinus rhythm Borderline right axis deviation Borderline prolonged QT interval now in nsr Confirmed by Isla Pence [phone]) on 05/10/2022 7:16:22 AM  Radiology DG Chest Port 1 View  Result Date: 05/10/2022 CLINICAL DATA:  82 year old male with history of near syncope. EXAM: PORTABLE CHEST 1 VIEW COMPARISON:  Chest x-ray 11/26/2021. FINDINGS: Lung volumes are normal. No consolidative airspace disease. Mild chronic scarring in the periphery of the left lung, stable compared to prior examinations. No pleural effusions. No pneumothorax. No pulmonary nodule or mass noted. Pulmonary vasculature and the cardiomediastinal silhouette are within normal limits. Atherosclerosis in the thoracic  aorta. IMPRESSION: 1.  No radiographic evidence of acute cardiopulmonary disease. 2. Aortic atherosclerosis. Electronically Signed   By: Vinnie Langton M.D.   On: 05/10/2022 07:35    Procedures Procedures    Medications Ordered in ED Medications  sodium chloride 0.9 % bolus 500 mL (500 mLs Intravenous New Bag/Given 05/10/22 [id number])    ED Course/ Medical Decision Making/ A&P  Medical Decision Making Amount and/or Complexity of Data Reviewed Labs: ordered. Radiology: ordered.   This patient presents to the ED for concern of near syncope, this involves an extensive number of treatment options, and is a complaint that carries with it a high risk of complications and morbidity.  The differential diagnosis includes orthostasis, afib with rvr, electrolyte abn, infection   Co morbidities that complicate the patient evaluation  afib (on eliquis), htn, aortic atherosclerosis, CAD, asthma, dysphagia, GERD, and anemia   Additional history obtained:  Additional history obtained from epic chart review External records from outside source obtained and reviewed including family   Lab Tests:  I Ordered, and personally interpreted labs.  The pertinent results include:  cbc nl, cmp nl, trop nl times 2   Imaging Studies ordered:  I ordered imaging studies including cxr  I independently visualized and interpreted imaging which showed   No radiographic evidence of acute cardiopulmonary disease.  2. Aortic atherosclerosis.   I agree with the radiologist interpretation   Cardiac Monitoring:  The patient was maintained on a cardiac monitor.  I personally viewed and interpreted the cardiac monitored which showed an underlying rhythm of: afib then nsr   Medicines ordered and prescription drug management:  I ordered medication including ivfs  for orthostasis  Reevaluation of the patient after these medicines showed that the patient improved I have reviewed the  patients home medicines and have made adjustments as needed   Test Considered:  ct   Critical Interventions:  ivfs   Problem List / ED Course:  Near-syncope:  likely a combination of orthostasis with afib with rvr.  Labs nl.  HR back to normal.  Pt does not want to be on metoprolol or florinef.  He knows to return if worse.  F/u with cards/pcp.   Reevaluation:  After the interventions noted above, I reevaluated the patient and found that they have :improved   Social Determinants of Health:  Lives at home   Dispostion:  After consideration of the diagnostic results and the patients response to treatment, I feel that the patent would benefit from discharge with outpatient f/u.          Final Clinical Impression(s) / ED Diagnoses Final diagnoses:  Near syncope  Paroxysmal atrial fibrillation [facility])    Rx / DC Orders ED Discharge Orders     None         Isla Pence, MD 05/10/22 [phone]

## 2022-05-10 NOTE — ED Notes (Signed)
Checked CBG 138

## 2022-05-19 NOTE — Progress Notes (Addendum)
Cardiology Office Note:    Date:  05/24/2022   ID:  John Livingston, DOB [birth year], MRN [id number]  PCP:  Puschinsky, Adelfa Koh., MD  Cardiologist:  Thurmon Fair, MD  Electrophysiologist:  None   Referring MD: Ivar Bury.,*   Chief Complaint: ED follow-up of near syncope and atrial fibrillation   History of Present Illness:    John Livingston is a 82 y.o. male with a history of history of paroxysmal atrial fibrillation on Eliquis, paroxysmal SVT, aortic atherosclerosis incidentally found on CT dating back to 2015, labile BP with prior hypertension but more recent hypotension, GERD, and prostate cancer s/p radiation who is followed by Dr. Royann Shivers and presents today for ED follow-up of near syncope and atrial fibrillation with RVR.  Patient was diagnosed with atrial fibrillation with RVR in 12/2018 in setting of cholelithiasis/cholecystitis. He had associated chest discomfort and was profoundly hypotensive with BP in the 80s/50s at time of diagnosis and actually required emergent cardioversion in the field with restoration of sinus rhythm. He was started on Eliquis and ultimately referred to Atrial Fibrillation clinic and [facility]. 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 Monitor in 03/2019 showed underlying sinus rhythm and frequent episodes of NSVT (lasting between 4 beats and 36 seconds) but no recurrence of atrial fibrillation. Therefore, he was started on Metoprolol. Repeat monitor in 07/2020 for further evaluation of palpitations with associated chest pain showed underlying sinus rhythm with frequent nocturnal bradycardia as well as frequent PACs and non-sustained atrial tachycardia with longest episode lasting 43 seconds. There were no convincing evidence of true atrial fibrillation.   He was seen by Dr. Royann Shivers in 10/2020 and reported hypotension with BP  as low at the 70s/30s as well as weakness and dizziness. He was only on a very low dose Metoprolol at that time as well as Tamsulosin. He reported serious difficulties with urination if he does not take an alpha-blocker. He was switched from Tamsulosin to Uroxatrol to see if that helped any. AM Cortisol was also ordered for evaluation of adrenal insufficiency and came back low at 5.5. ACTH testing was normal but he was started on Fludrocortisone to help with his hypotension. PYP scan was also ordered to rule out cardiac amyloidosis and was overall inconsistent with TTR amyloidosis. Carotid ultrasounds were also ordered and showed only mild stenosis (1-39% stenosis) of bilateral ICAs.  He was seen in the ED in 11/2021 for further evaluation of chest pain and generalized fatigue. He described the chest pain as intermittent hot flashes which caused a burning sensation on the front side of his body from his chin to his bellybutton. Troponin was negative. He tested positive for COVID-19. Paxlovid was offered but patient declined. He was seen by Edd Fabian, NP, for follow-up in 12/2021 at which time he his main complaints was palpitations when laying down at night. He denied any recurrent chest pain. Sleep study was recommended but patient declined and stated he would not wear a CPAP. He was continued on Toprol-XL 12.5mg  daily.  Since last visit, patient was recently seen in the ED on 05/10/2022 for near syncope after getting out of bed early in the morning with some associated chest tightness. Upon arrival to the ED, he was in rapid atrial fibrillation with rates in the 120s. High-sensitivity troponin was negative x2. Chest x-ray showed no acute findings. Total Bili was  slightly elevated at 1.4 but was otherwise unremarkable. He stopped taking Metoprolol due to "too many side effects" and had also stopped taking Fludrocortisone. Patient converted back to normal sinus rhythm in the ED and felt better after this.  Symptoms were felt to be likely a combination of orthostasis and atrial fibrillation with RVR. He was advised to follow-up with Cardiology and PCP.  Patient presents today for follow-up. Here with wife. Patient reports doing well since recent ED visit. However, his wife does state he had another episode of atrial fibrillation about 1.5 weeks after ED visit. Patient states this was very mild and only lasted a couple of minutes. During this episode, he reported palpitation and lightheadedness. Wife is more concerned about these recurrent episodes than patient is. Patient states he is fine and he would not of come to this visit if his wife had not scheduled it. He denies any recurrent chest tightness/ discomfort. No shortness of breath, orthopnea, or PND. He notes occasional dizziness with position changes but states this is much improved since stopping Metoprolol. He denies any syncope. BP is soft 96/48 today but he is asymptomatic with this. He is compliant with his Eliquis and denies any abnormal bleeding with this.  Past Medical History:  Diagnosis Date   Baker's cyst of knee    left   Cancer of appendix (HCC)    GERD (gastroesophageal reflux disease)    History of kidney stones    Hypertension    Moderate persistent asthma    pt. denies   Paroxysmal atrial fibrillation (HCC)    Paroxysmal SVT (supraventricular tachycardia)    Prostate cancer (HCC)    Ureteral stone     Past Surgical History:  Procedure Laterality Date   APPENDECTOMY     cartilage surgery in nose     CHOLECYSTECTOMY N/A 03/06/2019   Procedure: LAPAROSCOPIC CHOLECYSTECTOMY;  Surgeon: Abigail Miyamoto, MD;  Location: [facility] OR;  Service: General;  Laterality: N/A;   IR EXCHANGE BILIARY DRAIN  02/21/2019   IR PERC CHOLECYSTOSTOMY  01/17/2019   IR RADIOLOGIST EVAL & MGMT  02/19/2019    Current Medications: Current Meds  Medication Sig   acetaminophen (TYLENOL) 500 MG tablet Take 500 mg by mouth 3 (three) times daily as  needed for headache (pain).   albuterol (VENTOLIN HFA) 108 (90 Base) MCG/ACT inhaler Inhale 2 puffs into the lungs every 6 (six) hours as needed for wheezing or shortness of breath.   alfuzosin (UROXATRAL) 10 MG 24 hr tablet Take 1 tablet (10 mg total) by mouth daily with supper.   apixaban (ELIQUIS) 5 MG TABS tablet TAKE 1 TABLET BY MOUTH TWICE A DAY   atorvastatin (LIPITOR) 10 MG tablet TAKE 1 TABLET BY MOUTH EVERY DAY   brimonidine (ALPHAGAN) 0.2 % ophthalmic solution Place 1 drop into both eyes 3 (three) times daily.   omeprazole (PRILOSEC) 40 MG capsule Take 40 mg by mouth every morning.     Allergies:   Patient has no known allergies.   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    Comment: [employer]  Tobacco Use   Smoking status: Former    Packs/day: 1.00    Years: 20.00    Total pack years: 20.00  Types: Cigarettes    Quit date: 03/20/1978    Years since quitting: 44.2   Smokeless tobacco: Never  Vaping Use   Vaping Use: Never used  Substance and Sexual Activity   Alcohol use: Not Currently    Alcohol/week: 1.0 standard drink of alcohol    Types: 1 Cans of beer per week   Drug use: No   Sexual activity: Not on file  Other Topics Concern   Not on file  Social History Narrative   Not on file   Social Determinants of Health   Financial Resource Strain: Not on file  Food Insecurity: Not on file  Transportation Needs: Not on file  Physical Activity: Not on file  Stress: Not on file  Social Connections: Not on file     Family History: The patient's family history includes Asthma in his son; Heart disease in his mother; Prostate cancer in his father.  ROS:   Please see the history of present illness.     EKGs/Labs/Other Studies Reviewed:    The  following studies were reviewed:  Echocardiogram 02/24/2019: Impressions: 1. Left ventricular ejection fraction, by visual estimation, is 55 to  60%. The left ventricle has normal function. There is mildly increased  left ventricular hypertrophy.   2. Left ventricular diastolic parameters are consistent with Grade I  diastolic dysfunction (impaired relaxation).   3. The left ventricle has no regional wall motion abnormalities.   4. Global right ventricle has normal systolic function.The right  ventricular size is normal. No increase in right ventricular wall  thickness.   5. Left atrial size was normal.   6. Right atrial size was normal.   7. The mitral valve is normal in structure. No evidence of mitral valve  regurgitation. No evidence of mitral stenosis.   8. The tricuspid valve is normal in structure. Tricuspid valve  regurgitation is not demonstrated.   9. The aortic valve is tricuspid. Aortic valve regurgitation is not  visualized. Mild aortic valve sclerosis without stenosis.  10. There is mild dilatation of the aortic root measuring 40 mm.  11. TR signal is inadequate for assessing pulmonary artery systolic  pressure.  _______________   [facility] Monitor 03/2019: The dominant rhythm is normal sinus with normal circadian variation. There are extremely frequent episodes of nonsustained sustained paroxysmal atrial tachycardia lasting between 4 beats and 36 seconds. Some of the episodes of atrial tachycardia are associated with symptoms (dizziness). There are no significant episodes of bradycardia and no meaningful ventricular arrhythmia.   Abnormal event monitor due to very frequent episodes of brief nonsustained sustained ectopic atrial tachycardia.  Although true atrial fibrillation is not detected, findings suggest high likelihood of atrial fibrillation recurrence. _______________  [facility] [employer] Monitor 07/2020: The dominant rhythm is normal sinus with normal circadian variation. There are  frequent PACs, atrial couplets and episosed of nonsustained atrial tachycardia. There are also occasional episodes of sustained atrial tachycardia, but the longest episode is only 43 seconds. There is no convincing evidence of true atrial fibrillation, although artifact does hinedr the evaluation at times. If atrial fibrillation does occur, all episodes are less than 1 minute in duration. Ventricular arrhythmia is very infrequent, without complex arrhythmia. There is frequent nocturnal bradycardia in the 40-50 bpm range. No significant pauses are seen, brief pauses <2" are due to blocked PACs.   Abnormal event monitor due to frequent PACs and episodes of nonsustained  And very brief sustained ectopic atrial tachycardia. True atrial fibrillation is not seen. There is moderate nocturnal  bradycardia. There is poor correlation between the timing of detected arrhythmia and patient-reported symptoms. _______________  Carotid Ultrasound 11/10/2020: Summary: - Right Carotid: Velocities in the right ICA are consistent with a 1-39% stenosis.  - Left Carotid: Velocities in the left ICA are consistent with a 1-39% stenosis.  - Vertebrals: Bilateral vertebral arteries demonstrate antegrade flow.  - Subclavians: Normal flow hemodynamics were seen in bilateral subclavian arteries.  _______________  PYP Scan 12/03/2020:   By semi-quantitative assessment scan is consistent with no increased heart uptake-Grade 0. Heart to contralateral lung ratio is between 1-1.5, indeterminate for amyloid.   Study is equivocal for TTR amyloidosis (visual score of 1/ratio between 1-1.5).   Prior study not available for comparison.   Visual grade 0 at 3 hours (less than rib), but H/CL 1.2.  Overall, findings are inconsistent with TTR amyloidosis.     EKG:  EKG ordered today. EKG personally reviewed and demonstrates normal sinus rhythm with marked sinus arrhythmia, rate 68 bpm. Non-specific ST/T changes. Normal axis. Normal  PR and QRS intervals. QTc 455 ms.  Recent Labs: 05/10/2022: Hemoglobin 13.8; Platelets 184 05/24/2022: ALT 13; BUN 15; Creatinine, Ser 1.12; Potassium 4.3; Sodium 140; TSH 5.030  Recent Lipid Panel    Component Value Date/Time   CHOL 175 05/01/2019 0846   TRIG 90 05/01/2019 0846   HDL 37 (L) 05/01/2019 0846   CHOLHDL 4.7 05/01/2019 0846   LDLCALC 121 (H) 05/01/2019 0846    Physical Exam:    Vital Signs: BP (!) 96/48 (BP Location: Left Arm, Patient Position: Sitting, Cuff Size: Normal)   Pulse 68   Ht 5\' 9"  (1.753 m)   Wt 198 lb 9.6 oz (90.1 kg)   SpO2 96%   BMI 29.33 kg/m     Wt Readings from Last 3 Encounters:  05/24/22 198 lb 9.6 oz (90.1 kg)  05/10/22 193 lb (87.5 kg)  01/12/22 196 lb (88.9 kg)     General: 82 y.o. Caucasian male in no acute distress. HEENT: Normocephalic and atraumatic. Sclera clear. EOMs intact. Neck: Supple. No carotid bruits. No JVD. Heart: Irregular rhythm with normal rate. Distinct S1 and S2. No murmurs, gallops, or rubs. Radial pulses 2+ and equal bilaterally. Lungs: No increased work of breathing. Clear to ausculation bilaterally. No wheezes, rhonchi, or rales.  Abdomen: Soft, non-distended, and non-tender to palpation.  Extremities: Trace lower extremity edema bilaterally.   Skin: Warm and dry. Neuro: Alert and oriented x3. No focal deficits. Psych: Normal affect. Responds appropriately.   Assessment:    1. Paroxysmal atrial fibrillation (HCC)   2. Paroxysmal SVT (supraventricular tachycardia)   3. Aortic atherosclerosis (HCC)   4. Hypotension, unspecified hypotension type   5. Hyperlipidemia, unspecified hyperlipidemia type     Plan:    Paroxysmal Atrial Fibrillation Paroxysmal SVT Nocturnal Bradycardia Patient has history of paroxysmal atrial fibrillation as well as non-sustained and brief SVT on monitor in 03/2019. Repeat monitor in 07/2020 for further evaluation of palpitations with associated chest pain showed underlying sinus  rhythm with frequent nocturnal bradycardia as well as frequent PACs and non-sustained atrial tachycardia with longest episode lasting 43 seconds; however, there were no convincing evidence of true atrial fibrillation. He was recently seen in the ED in 04/2022 for near syncope and chest tightness and was found to be in rapid atrial fibrillation with ventricular rates in the 120s but thankfully converted to sinus rhythm spontaneously with improvement in symptoms.  - He reports one other episode of palpitations with associated lightheadedness/dizziness since ED  visit but this was mild in nature and only lasted a couple of minutes. - EKG today shows normal sinus rhythm with sinus arrhyhtmia.  - Previously on low dose Toprol-XL but patient stopped this due to "too many side effects." - Continue chronic anticoagulation with Eliquis 5mg  twice daily. - Discussed starting Amiodarone to help keep him in sinus rhythm. Wife was interested in this but patient is very hesitant to start this due to potential side effects. Will hold off on this for now but advised patient/wife to let us know if these episodes are occurring more frequently or getting more severe. If this happens, would recommend starting Amiodarone 200mg  daily. Will go ahead and check TSH to have as a baseline in case we end up starting Amiodarone. Will also repeat CMET today (Total Bili was slightly elevated in the ED but remainder of LFTs were normal).   Aortic Atherosclerosis Noted on prior CT dating back to 2015. - No aspirin due to need for [facility]. - Continue statin.   Hypotension Patient previously had a history of hypertension but has struggled more with hypotension lately. PYP scan was inconsistent with TTR amyloidosis. AM Cortisol was low in 2022 but ACTH testing was normal. He was previously on Fludrocortisone but stopped taking this.  - BP soft but stable today at 96/48.  - Patient asymptomatic with this today. He does not occasional  lightheadedness/ dizziness with position changes but this is much improved since stopping the Metoprolol. - Recommended changing positions slowly, wearing compression stockings, and elevating legs as much as possible.   Hyperlipidemia Last lipid panel in 04/2019: Total Cholesterol 175, Triglycerides 90, HDL 37, LDL 121. LDL goal <70 give aortic atherosclerosis. - Continue Lipitor 10mg  daily.  - Patient is not fasting today so will come back for fasting lipid panel.  Disposition: Follow up in 6 months.   Medication Adjustments/Labs and Tests Ordered: Current medicines are reviewed at length with the patient today.  Concerns regarding medicines are outlined above.  Orders Placed This Encounter  Procedures   TSH   Lipid panel   Comprehensive metabolic panel   EKG 12-Lead   No orders of the defined types were placed in this encounter.   Patient Instructions  Medication Instructions:   Your physician recommends that you continue on your current medications as directed. Please refer to the Current Medication list given to you today.  *If you need a refill on your cardiac medications before your next appointment, please call your pharmacy*  Lab Work: Your physician recommends that you return for lab work TODAY:  CMP TSH  Your physician recommends that you return for lab work in 1 month:  Fasting Lipid Panel-DO NOT eat or drink past midnight. Okay to have water and/or black coffee only   If you have labs (blood work) drawn today and your tests are completely normal, you will receive your results only by: MyChart Message (if you have MyChart) OR A paper copy in the mail If you have any lab test that is abnormal or we need to change your treatment, we will call you to review the results.  Testing/Procedures: NONE ordered at this time of appointment   Follow-Up: At [facility], you and your health needs are our priority.  As part of our continuing mission to provide you  with exceptional heart care, we have created designated Provider Care Teams.  These Care Teams include your primary Cardiologist (physician) and Advanced Practice Providers (APPs -  Physician Assistants and Nurse  Practitioners) who all work together to provide you with the care you need, when you need it.    Your next appointment:   6 month(s)  Provider:   Thurmon Fair, MD  or Marjie Skiff, PA-C        Other Instructions     Signed, Corrin Parker, PA-C  05/25/2022 7:11 AM    [facility]

## 2022-05-23 ENCOUNTER — Encounter: Payer: Self-pay | Admitting: Student

## 2022-05-23 DIAGNOSIS — I471 Supraventricular tachycardia, unspecified: Secondary | ICD-10-CM | POA: Insufficient documentation

## 2022-05-24 ENCOUNTER — Encounter: Payer: Self-pay | Admitting: Student

## 2022-05-24 ENCOUNTER — Ambulatory Visit: Payer: Medicare Other | Attending: Student | Admitting: Student

## 2022-05-24 VITALS — BP 96/48 | HR 68 | Ht 69.0 in | Wt 198.6 lb

## 2022-05-24 DIAGNOSIS — I700 Atherosclerosis of aorta: Secondary | ICD-10-CM

## 2022-05-24 DIAGNOSIS — I480 Paroxysmal atrial fibrillation: Secondary | ICD-10-CM | POA: Diagnosis not present

## 2022-05-24 DIAGNOSIS — I959 Hypotension, unspecified: Secondary | ICD-10-CM | POA: Diagnosis present

## 2022-05-24 DIAGNOSIS — I471 Supraventricular tachycardia, unspecified: Secondary | ICD-10-CM

## 2022-05-24 DIAGNOSIS — E785 Hyperlipidemia, unspecified: Secondary | ICD-10-CM

## 2022-05-24 NOTE — Patient Instructions (Addendum)
Medication Instructions:   Your physician recommends that you continue on your current medications as directed. Please refer to the Current Medication list given to you today.  *If you need a refill on your cardiac medications before your next appointment, please call your pharmacy*  Lab Work: Your physician recommends that you return for lab work TODAY:  CMP TSH  Your physician recommends that you return for lab work in 1 month:  Fasting Lipid Panel-DO NOT eat or drink past midnight. Okay to have water and/or black coffee only   If you have labs (blood work) drawn today and your tests are completely normal, you will receive your results only by: [place] (if you have MyChart) OR A paper copy in the mail If you have any lab test that is abnormal or we need to change your treatment, we will call you to review the results.  Testing/Procedures: NONE ordered at this time of appointment   Follow-Up: At [facility], you and your health needs are our priority.  As part of our continuing mission to provide you with exceptional heart care, we have created designated Provider Care Teams.  These Care Teams include your primary Cardiologist (physician) and Advanced Practice Providers (APPs -  Physician Assistants and Nurse Practitioners) who all work together to provide you with the care you need, when you need it.    Your next appointment:   6 month(s)  Provider:   Sanda Klein, MD  or Sande Rives, PA-C        Other Instructions

## 2022-05-25 LAB — COMPREHENSIVE METABOLIC PANEL
ALT: 13 IU/L (ref 0–44)
AST: 18 IU/L (ref 0–40)
Albumin/Globulin Ratio: 1.9 (ref 1.2–2.2)
Albumin: 4.3 g/dL (ref 3.7–4.7)
Alkaline Phosphatase: 61 IU/L (ref 44–121)
BUN/Creatinine Ratio: 13 (ref 10–24)
BUN: 15 mg/dL (ref 8–27)
Bilirubin Total: 0.5 mg/dL (ref 0.0–1.2)
CO2: 25 mmol/L (ref 20–29)
Calcium: 9.3 mg/dL (ref 8.6–10.2)
Chloride: 104 mmol/L (ref 96–106)
Creatinine, Ser: 1.12 mg/dL (ref 0.76–1.27)
Globulin, Total: 2.3 g/dL (ref 1.5–4.5)
Glucose: 108 mg/dL — ABNORMAL HIGH (ref 70–99)
Potassium: 4.3 mmol/L (ref 3.5–5.2)
Sodium: 140 mmol/L (ref 134–144)
Total Protein: 6.6 g/dL (ref 6.0–8.5)
eGFR: 65 mL/min/{1.73_m2} (ref >59)

## 2022-05-25 LAB — TSH: TSH: 5.03 u[IU]/mL — ABNORMAL HIGH (ref 0.450–4.500)

## 2022-05-27 NOTE — Progress Notes (Signed)
He is a challenge to treat even if he would take the meds. If he won't take them, even harder.

## 2022-06-03 ENCOUNTER — Other Ambulatory Visit: Payer: Self-pay | Admitting: Cardiovascular Disease

## 2022-06-07 ENCOUNTER — Telehealth: Payer: Self-pay | Admitting: Cardiovascular Disease

## 2022-06-07 NOTE — Telephone Encounter (Signed)
*  STAT* If patient is at the pharmacy, call can be transferred to refill team.   1. Which medications need to be refilled? (please list name of each medication and dose if known) alfuzosin (UROXATRAL) 10 MG 24 hr tablet   2. Which pharmacy/location (including street and city if local pharmacy) is medication to be sent to? [id number] - [place], [place] - [address] AT [address]OSS [address]   3. Do they need a 30 day or 90 day supply? [place]

## 2022-06-09 NOTE — Telephone Encounter (Signed)
Already been taken care of  

## 2022-06-11 ENCOUNTER — Other Ambulatory Visit: Payer: Self-pay | Admitting: Cardiovascular Disease

## 2022-06-12 NOTE — Telephone Encounter (Signed)
Prescription refill request for Eliquis received. Indication: PAF Last office visit: 05/24/22  Virgie Dad MD Scr: 1.12 on 05/24/22 Age: 82 Weight: 90.1kg   Based on above findings Eliquis 5mg  twice daily is the appropriate dose.  Refill approved.

## 2022-06-25 ENCOUNTER — Emergency Department (HOSPITAL_BASED_OUTPATIENT_CLINIC_OR_DEPARTMENT_OTHER): Payer: Medicare Other

## 2022-06-25 ENCOUNTER — Encounter (HOSPITAL_BASED_OUTPATIENT_CLINIC_OR_DEPARTMENT_OTHER): Payer: Self-pay

## 2022-06-25 ENCOUNTER — Emergency Department (HOSPITAL_BASED_OUTPATIENT_CLINIC_OR_DEPARTMENT_OTHER)
Admission: EM | Admit: 2022-06-25 | Discharge: 2022-06-25 | Disposition: A | Discharge status: Home / Self Care | Payer: Medicare Other | Attending: Emergency Medicine | Admitting: Emergency Medicine

## 2022-06-25 DIAGNOSIS — R000 Tachycardia, unspecified: Secondary | ICD-10-CM | POA: Insufficient documentation

## 2022-06-25 DIAGNOSIS — R531 Weakness: Secondary | ICD-10-CM | POA: Diagnosis not present

## 2022-06-25 DIAGNOSIS — R0602 Shortness of breath: Secondary | ICD-10-CM | POA: Insufficient documentation

## 2022-06-25 DIAGNOSIS — Z7901 Long term (current) use of anticoagulants: Secondary | ICD-10-CM | POA: Insufficient documentation

## 2022-06-25 DIAGNOSIS — I480 Paroxysmal atrial fibrillation: Secondary | ICD-10-CM | POA: Insufficient documentation

## 2022-06-25 DIAGNOSIS — R079 Chest pain, unspecified: Secondary | ICD-10-CM | POA: Diagnosis present

## 2022-06-25 LAB — CBC
HCT: 37.7 % — ABNORMAL LOW (ref 39.0–52.0)
Hemoglobin: 13 g/dL (ref 13.0–17.0)
MCH: 30.9 pg (ref 26.0–34.0)
MCHC: 34.5 g/dL (ref 30.0–36.0)
MCV: 89.5 fL (ref 80.0–100.0)
Platelets: 152 10*3/uL (ref 150–400)
RBC: 4.21 MIL/uL — ABNORMAL LOW (ref 4.22–5.81)
RDW: 13.9 % (ref 11.5–15.5)
WBC: 4.1 10*3/uL (ref 4.0–10.5)
nRBC: 0 % (ref 0.0–0.2)

## 2022-06-25 LAB — BASIC METABOLIC PANEL
Anion gap: 9 (ref 5–15)
BUN: 17 mg/dL (ref 8–23)
CO2: 25 mmol/L (ref 22–32)
Calcium: 8.8 mg/dL — ABNORMAL LOW (ref 8.9–10.3)
Chloride: 102 mmol/L (ref 98–111)
Creatinine, Ser: 1.48 mg/dL — ABNORMAL HIGH (ref 0.61–1.24)
GFR, Estimated: 47 mL/min — ABNORMAL LOW (ref >60)
Glucose, Bld: 181 mg/dL — ABNORMAL HIGH (ref 70–99)
Potassium: 3.8 mmol/L (ref 3.5–5.1)
Sodium: 136 mmol/L (ref 135–145)

## 2022-06-25 LAB — TROPONIN I (HIGH SENSITIVITY): Troponin I (High Sensitivity): 4 ng/L (ref <18)

## 2022-06-25 LAB — MAGNESIUM: Magnesium: 2.1 mg/dL (ref 1.7–2.4)

## 2022-06-25 MED ORDER — SODIUM CHLORIDE 0.9 % IV BOLUS
500.0000 mL | Freq: Once | INTRAVENOUS | Status: AC
  Administered 2022-06-25: 500 mL via INTRAVENOUS

## 2022-06-25 MED ORDER — SODIUM CHLORIDE 0.9 % IV BOLUS: 500.0000 mL | Freq: Once | INTRAVENOUS | Status: DC

## 2022-06-25 MED ORDER — ETOMIDATE 2 MG/ML IV SOLN
10.0000 mg | Freq: Once | INTRAVENOUS | Status: AC
  Administered 2022-06-25: 10 mg via INTRAVENOUS
  Filled 2022-06-25: qty 10

## 2022-06-25 NOTE — Progress Notes (Signed)
RT on standby for Cardioiversion. Patient SPO2 maintained at 97-99% for procedure, and ETCO2 maintained no lower tan 21 then increased back to 31. Patient appeared to have tolerated well

## 2022-06-25 NOTE — ED Notes (Signed)
Per EDP, holding off on cardioversion until results come back.

## 2022-06-25 NOTE — Sedation Documentation (Addendum)
Pt unresponsive. Synchronized cardioversion given at 200j. NSR noted on monitor. Unable to rate pain during procedure

## 2022-06-25 NOTE — ED Notes (Signed)
Ambulatory to BR, denies dizziness, CP or SOB. Gait steady

## 2022-06-25 NOTE — ED Triage Notes (Signed)
Chest tightness since last night, hx of A.fib, states felt like he was in it last night and almost passed out. C/o generalized weakness.

## 2022-06-25 NOTE — ED Notes (Addendum)
Pt had episode of HR in 180s during X ray. EDP informed. EDP talking to patient about treatment options. HR now 155. Verbal consent received for synchronized cardioversion and sedation.

## 2022-06-25 NOTE — Progress Notes (Signed)
Patient taken off the 2L Nasal cannula. He is tolerating room air well at this time and doesn't appear to be in any distress

## 2022-06-25 NOTE — Discharge Instructions (Addendum)
You are seen in the emergency room for dizziness, weakness and were noted to be in A-fib with RVR.  With cardioversion, your heart rate and rhythm has normalized.  We recommend that you follow-up with the A-fib clinic or your cardiologist in 1 to 2-weeks.  Since you received procedural sedation, recommend that you take it easy rest of the day with any strenuous activity.  Additionally, if there is any symptoms of stroke such as one-sided weakness, numbness, slurred speech, vision change or loss of vision call 911 immediately.

## 2022-06-25 NOTE — ED Provider Notes (Signed)
[facility] Provider Note   CSN: [id number] Arrival date & time: 06/25/22  0932     History  Chief Complaint  Patient presents with   Chest Pain    John Livingston is a 82 y.o. male.  HPI     82 year old male comes in with chief complaint of chest pain, dizziness and near fainting.  Patient has known history of paroxysmal A-fib for which he has been taking Eliquis as prescribed.  He was on metoprolol but that was discontinued as he was not tolerating it.  Patient states that last night he started noticing chest tightness and generalized weakness.  This morning he felt like he was going to pass out, and decided to come to the ER.  He could tell that his heart is racing.  Patient denies any recent illnesses, new medications, severe nausea or vomiting.  He does indicate that his appetite has been low the last 2 weeks and he has had reduced p.o. intake because of that.  He required cardioversion few years back.  Home Medications Prior to Admission medications   Medication Sig Start Date End Date Taking? Authorizing Provider  acetaminophen (TYLENOL) 500 MG tablet Take 500 mg by mouth 3 (three) times daily as needed for headache (pain).    [provider]  albuterol (VENTOLIN HFA) 108 (90 Base) MCG/ACT inhaler Inhale 2 puffs into the lungs every 6 (six) hours as needed for wheezing or shortness of breath. 01/23/21   Alwyn Ren, MD  alfuzosin (UROXATRAL) 10 MG 24 hr tablet TAKE 1 TABLET (10 MG TOTAL) BY MOUTH DAILY WITH SUPPER. 06/08/22   Croitoru, Mihai, MD  apixaban (ELIQUIS) 5 MG TABS tablet TAKE 1 TABLET BY MOUTH TWICE A DAY 06/12/22   Croitoru, Mihai, MD  atorvastatin (LIPITOR) 10 MG tablet TAKE 1 TABLET BY MOUTH EVERY DAY 08/16/21   [facility], Roe Rutherford, PA  brimonidine (ALPHAGAN) 0.2 % ophthalmic solution Place 1 drop into both eyes 3 (three) times daily. 02/28/19   [provider]  finasteride (PROSCAR) 5 MG  tablet Take 5 mg by mouth daily. 12/24/21   [provider]  lipase/protease/amylase (CREON) 36000 UNITS CPEP capsule Take 36,000 Units by mouth daily with supper.    [provider]  omeprazole (PRILOSEC) 40 MG capsule Take 40 mg by mouth every morning.    [provider]  ondansetron (ZOFRAN) 4 MG tablet Take 1 tablet (4 mg total) by mouth every 6 (six) hours. 11/26/21   Janell Quiet, PA-C  saccharomyces boulardii (FLORASTOR) 250 MG capsule Take 1 capsule (250 mg total) by mouth 2 (two) times daily. 01/23/21   Alwyn Ren, MD  traZODone (DESYREL) 50 MG tablet Take 1 tablet (50 mg total) by mouth at bedtime. 01/23/21   Alwyn Ren, MD      Allergies    Patient has no known allergies.    Review of Systems   Review of Systems  All other systems reviewed and are negative.   Physical Exam Updated Vital Signs BP 126/74   Pulse 80   Temp 97.9 F (36.6 C) (Oral)   Resp 14   Ht 5\' 9"  (1.753 m)   Wt 93 kg   SpO2 94%   BMI 30.27 kg/m  Physical Exam Vitals and nursing note reviewed.  Constitutional:      Appearance: He is ill-appearing and diaphoretic.  HENT:     Head: Atraumatic.  Cardiovascular:     Rate  and Rhythm: Tachycardia present. Rhythm irregular.  Pulmonary:     Effort: Pulmonary effort is normal.     Breath sounds: Normal breath sounds.  Musculoskeletal:     Cervical back: Neck supple.  Skin:    General: Skin is warm.  Neurological:     Mental Status: He is alert and oriented to person, place, and time.     ED Results / Procedures / Treatments   Labs (all labs ordered are listed, but only abnormal results are displayed) Labs Reviewed  BASIC METABOLIC PANEL - Abnormal; Notable for the following components:      Result Value   Glucose, Bld 181 (*)    Creatinine, Ser 1.48 (*)    Calcium 8.8 (*)    GFR, Estimated 47 (*)    All other components within normal limits  CBC - Abnormal; Notable for the following components:    RBC 4.21 (*)    HCT 37.7 (*)    All other components within normal limits  MAGNESIUM  TROPONIN I (HIGH SENSITIVITY)    EKG EKG Interpretation  Date/Time:  Sunday June 25 2022 09:37:26 EDT Ventricular Rate:  137 PR Interval:    QRS Duration: 106 QT Interval:  333 QTC Calculation: 503 R Axis:   103 Text Interpretation: Atrial fibrillation with rapid V-rate Right axis deviation ST depression, probably rate related afib with RVR Confirmed by Derwood Kaplan ([id number]) on 06/25/2022 10:34:07 AM  Radiology DG Chest Port 1 View  Result Date: 06/25/2022 CLINICAL DATA:  Chest pain beginning last night. Presyncope. Weakness. Tachycardia. EXAM: PORTABLE CHEST 1 VIEW COMPARISON:  05/10/2022 FINDINGS: The heart size and mediastinal contours are within normal limits. Aortic atherosclerotic calcification incidentally noted. Both lungs are clear. IMPRESSION: No active disease. Electronically Signed   By: Danae Orleans M.D.   On: 06/25/2022 10:40    Procedures .Critical Care  Performed by: Derwood Kaplan, MD Authorized by: Derwood Kaplan, MD   Critical care provider statement:    Critical care time (minutes):  36   Critical care time was exclusive of:  Separately billable procedures and treating other patients   Critical care was necessary to treat or prevent imminent or life-threatening deterioration of the following conditions:  Circulatory failure and shock   Critical care was time spent personally by me on the following activities:  Development of treatment plan with patient or surrogate, discussions with consultants, evaluation of patient's response to treatment, examination of patient, ordering and review of laboratory studies, ordering and review of radiographic studies, ordering and performing treatments and interventions, pulse oximetry, re-evaluation of patient's condition, review of old charts and obtaining history from patient or surrogate .Sedation  Date/Time: 06/25/2022 10:01  AM  Performed by: Derwood Kaplan, MD Authorized by: Derwood Kaplan, MD   Consent:    Consent obtained:  Written   Consent given by:  Patient   Risks discussed:  Prolonged hypoxia resulting in organ damage, prolonged sedation necessitating reversal, allergic reaction, dysrhythmia, inadequate sedation, respiratory compromise necessitating ventilatory assistance and intubation, nausea and vomiting Universal protocol:    Procedure explained and questions answered to patient or proxy's satisfaction: yes     Relevant documents present and verified: yes     Test results available: yes     Imaging studies available: yes     Required blood products, implants, devices, and special equipment available: yes     Site/side marked: yes     Immediately prior to procedure, a time out was called: yes  Patient identity confirmed:  Arm band Indications:    Procedure performed:  Cardioversion   Procedure necessitating sedation performed by:  Physician performing sedation Pre-sedation assessment:    Time since last food or drink:  3 hours   ASA classification: class 3 - patient with severe systemic disease     Mouth opening:  3 or more finger widths   Mallampati score:  II - soft palate, uvula, fauces visible   Neck mobility: normal     Pre-sedation assessments completed and reviewed: airway patency, cardiovascular function, hydration status, mental status, nausea/vomiting, pain level, respiratory function and temperature     Pre-sedation assessment completed:  06/25/2022 10:02 AM Immediate pre-procedure details:    Reassessment: Patient reassessed immediately prior to procedure     Reviewed: vital signs, relevant labs/tests and NPO status     Verified: bag valve mask available, emergency equipment available, intubation equipment available, IV patency confirmed, oxygen available and reversal medications available   Procedure details (see MAR for exact dosages):    Preoxygenation:  Nasal cannula    Sedation:  Etomidate   Intended level of sedation: deep   Intra-procedure monitoring:  Blood pressure monitoring, continuous capnometry, frequent LOC assessments, frequent vital sign checks, continuous pulse oximetry and cardiac monitor   Intra-procedure events: none     Total Provider sedation time (minutes):  13 Post-procedure details:    Post-sedation assessment completed:  06/25/2022 12:02 PM   Attendance: Constant attendance by certified staff until patient recovered     Recovery: Patient returned to pre-procedure baseline     Post-sedation assessments completed and reviewed: airway patency, cardiovascular function, hydration status, mental status, nausea/vomiting, pain level, respiratory function and temperature     Patient is stable for discharge or admission: yes     Procedure completion:  Tolerated well, no immediate complications .Cardioversion  Date/Time: 06/25/2022 12:03 PM  Performed by: Derwood Kaplan, MD Authorized by: Derwood Kaplan, MD   Consent:    Consent obtained:  Written   Consent given by:  Spouse   Alternatives discussed:  Rate-control medication Universal protocol:    Procedure explained and questions answered to patient or proxy's satisfaction: yes     Relevant documents present and verified: yes     Test results available and properly labeled: yes     Imaging studies available: yes     Required blood products, implants, devices, and special equipment available: yes     Site/side marked: yes     Immediately prior to procedure a time out was called: yes     Patient identity confirmed:  Arm band Pre-procedure details:    Cardioversion basis:  Emergent   Rhythm:  Atrial fibrillation   Electrode placement:  Anterior-posterior Patient sedated: Yes. Refer to sedation procedure documentation for details of sedation.  Attempt one:    Cardioversion mode:  Synchronous   Waveform:  Biphasic   Shock (Joules):  200   Shock outcome:  Conversion to normal sinus  rhythm Post-procedure details:    Patient status:  Awake   Patient tolerance of procedure:  Tolerated well, no immediate complications     Medications Ordered in ED Medications  sodium chloride 0.9 % bolus 500 mL (0 mLs Intravenous Stopped 06/25/22 1023)  etomidate (AMIDATE) injection 10 mg (10 mg Intravenous Given 06/25/22 1036)  sodium chloride 0.9 % bolus 500 mL (0 mLs Intravenous Stopped 06/25/22 1048)    ED Course/ Medical Decision Making/ A&P  Medical Decision Making Amount and/or Complexity of Data Reviewed Labs: ordered. Radiology: ordered. ECG/medicine tests: ordered.  Risk Prescription drug management.   This patient presents to the ED with chief complaint(s) of chest pressure, shortness of breath, generalized weakness, dizziness with near fainting with pertinent past medical history of atrial fibrillation not on rate control, but anticoagulated.The complaint involves an extensive differential diagnosis and also carries with it a high risk of complications and morbidity.    The differential diagnosis includes : Symptomatic A-fib with RVR, A-fib secondary to severe dehydration, electrolyte abnormality, underlying infectious process, medication interactions.  The initial plan is to initiate fluids and start getting labs to make sure there is no reversible cause.  Patient likely is dehydrated.  His blood pressure initially was low, but it stabilized on the second read.  Anytime he sits up, he does feel dizzy.  He is laying flat at this time.   Additional history obtained: Additional history obtained from spouse Records reviewed  cardiology note, it seems like patient has paroxysmal A-fib.  He is often found in sinus rhythm.  Previous ER visit where cardioversion was completed was also noted.  Independent labs interpretation:  The following labs were independently interpreted: Patient has mild elevation in creatinine.  Otherwise labs are  reassuring.  Independent visualization and interpretation of imaging: - I independently visualized the following imaging with scope of interpretation limited to determining acute life threatening conditions related to emergency care: X-ray of the chest, which revealed no potential pulmonary edema  Treatment and Reassessment: I reassessed the patient around 10:30 AM.  At that time patient was getting x-ray, he was sat up and he became finally dizzy, sweaty and felt like he was going to faint again.  His heart rate jumped to 190.  Family consented at that time to proceed with cardioversion, which was subsequently completed without any complications.  Patient reassessed again at 12 PM.  He is stable for discharge.  He has returned to baseline.  He will be ambulated prior to discharge.  A-fib clinic follow-up or cardiology follow-up with Dr. Salena Saner recommended.  Final Clinical Impression(s) / ED Diagnoses Final diagnoses:  Paroxysmal atrial fibrillation with RVR    Rx / DC Orders ED Discharge Orders          Ordered    Amb referral to AFIB Clinic        06/25/22 0947              Derwood KaplanNanavati, Kassadie Pancake, MD 06/25/22 1206

## 2022-06-25 NOTE — ED Notes (Signed)
ED Provider at bedside. 

## 2022-06-25 NOTE — ED Notes (Signed)
RT set patient up for End-Tidal CO2 and oxygen monitoring. Currently on 2L [place] and tolerating well preparing for a Cardioversion

## 2022-07-04 ENCOUNTER — Encounter (HOSPITAL_COMMUNITY): Payer: Self-pay | Admitting: Internal Medicine

## 2022-07-04 ENCOUNTER — Ambulatory Visit (HOSPITAL_COMMUNITY)
Admission: RE | Admit: 2022-07-04 | Discharge: 2022-07-04 | Disposition: A | Discharge status: Home / Self Care | Payer: Medicare Other | Source: Ambulatory Visit | Attending: Internal Medicine | Admitting: Internal Medicine

## 2022-07-04 VITALS — BP 124/70 | HR 82 | Ht 69.0 in | Wt 195.4 lb

## 2022-07-04 DIAGNOSIS — I10 Essential (primary) hypertension: Secondary | ICD-10-CM | POA: Insufficient documentation

## 2022-07-04 DIAGNOSIS — Z7901 Long term (current) use of anticoagulants: Secondary | ICD-10-CM | POA: Diagnosis not present

## 2022-07-04 DIAGNOSIS — E669 Obesity, unspecified: Secondary | ICD-10-CM | POA: Diagnosis not present

## 2022-07-04 DIAGNOSIS — D6869 Other thrombophilia: Secondary | ICD-10-CM | POA: Insufficient documentation

## 2022-07-04 DIAGNOSIS — Z6828 Body mass index (BMI) 28.0-28.9, adult: Secondary | ICD-10-CM | POA: Insufficient documentation

## 2022-07-04 DIAGNOSIS — I471 Supraventricular tachycardia, unspecified: Secondary | ICD-10-CM

## 2022-07-04 DIAGNOSIS — I480 Paroxysmal atrial fibrillation: Secondary | ICD-10-CM | POA: Insufficient documentation

## 2022-07-04 MED ORDER — APIXABAN 5 MG PO TABS: 5.0000 mg | ORAL_TABLET | Freq: Two times a day (BID) | ORAL | 5 refills | Status: DC

## 2022-07-04 NOTE — Progress Notes (Signed)
Primary Care Physician: Puschinsky, Adelfa Koh., MD Primary Cardiologist: Dr. Royann Shivers Primary Electrophysiologist: None Referring Physician: ED at [facility]   John Livingston is a 82 y.o. male with a history of paroxysmal Afib, paroxysmal SVT, aortic atherosclerosis, HTN, GERD, and prostate cancer s/p radiation who presents for consultation in the [facility].  The patient was initially diagnosed with atrial fibrillation in 12/2018. He is most recently s/p ED visit on 4/7 for Afib with RVR and was cardioverted in the ED. He was successfully converted to SR. Patient is on Eliquis for a CHADS2VASC score of 4.  On evaluation today, he is doing well since ED visit. He has not had any additional episodes of Afib since then. He does note significant symptoms when he is in Afib. He has had about 3 total episodes of Afib including the first event in 2020. He is interested in considering treatment for Afib since the most recent episode was very symptomatic and required ED intervention.  He is compliant with anticoagulation and has not missed any doses. He has no bleeding concerns. Noted that he is taking Eliquis 2.5 mg BID but historically has been taking 5 mg BID. I cannot find why he was lowered to 2.5 mg dose.  Today, he denies symptoms of palpitations, chest pain, shortness of breath, orthopnea, PND, lower extremity edema, dizziness, presyncope, syncope, snoring, daytime somnolence, bleeding, or neurologic sequela. The patient is tolerating medications without difficulties and is otherwise without complaint today.   Atrial Fibrillation Risk Factors:  he does not have symptoms or diagnosis of sleep apnea. he does not have a history of rheumatic fever. he does not have a history of alcohol use. The patient does not have a history of early familial atrial fibrillation or other arrhythmias.  he has a BMI of Body mass index is 28.86 kg/m.Marland Kitchen Filed Weights    07/04/22 0842  Weight: 88.6 kg    Family History  Problem Relation Age of Onset   Asthma Son        as a child   Heart disease Mother    Prostate cancer Father      Atrial Fibrillation Management history:  Previous antiarrhythmic drugs: None Previous cardioversions: 2, most recently 06/25/22 Previous ablations: None Anticoagulation history: Eliquis   Past Medical History:  Diagnosis Date   Baker's cyst of knee    left   Cancer of appendix    GERD (gastroesophageal reflux disease)    History of kidney stones    Hypertension    Moderate persistent asthma    pt. denies   Paroxysmal atrial fibrillation    Paroxysmal SVT (supraventricular tachycardia)    Prostate cancer    Ureteral stone    Past Surgical History:  Procedure Laterality Date   APPENDECTOMY     cartilage surgery in nose     CHOLECYSTECTOMY N/A 03/06/2019   Procedure: LAPAROSCOPIC CHOLECYSTECTOMY;  Surgeon: Abigail Miyamoto, MD;  Location: [facility] OR;  Service: General;  Laterality: N/A;   IR EXCHANGE BILIARY DRAIN  02/21/2019   IR PERC CHOLECYSTOSTOMY  01/17/2019   IR RADIOLOGIST EVAL & MGMT  02/19/2019    Current Outpatient Medications  Medication Sig Dispense Refill   acetaminophen (TYLENOL) 500 MG tablet Take 500 mg by mouth 3 (three) times daily as needed for headache (pain).     alfuzosin (UROXATRAL) 10 MG 24 hr tablet TAKE 1 TABLET (10 MG TOTAL) BY MOUTH DAILY WITH SUPPER. 90 tablet 3   atorvastatin (  LIPITOR) 10 MG tablet TAKE 1 TABLET BY MOUTH EVERY DAY 90 tablet 3   brimonidine (ALPHAGAN) 0.2 % ophthalmic solution Place 1 drop into both eyes 3 (three) times daily.     dorzolamide-timolol (COSOPT) 2-0.5 % ophthalmic solution Place 1 drop into the left eye 2 (two) times daily.     finasteride (PROSCAR) 5 MG tablet Take 5 mg by mouth daily.     moxifloxacin (VIGAMOX) 0.5 % ophthalmic solution Place 1 drop into the right eye 4 (four) times daily.     omeprazole (PRILOSEC) 40 MG capsule Take 40 mg by mouth  every morning.     prednisoLONE acetate (PRED FORTE) 1 % ophthalmic suspension Place 1 drop into the right eye 4 (four) times daily.     apixaban (ELIQUIS) 5 MG TABS tablet Take 1 tablet (5 mg total) by mouth 2 (two) times daily. 60 tablet 5   No current facility-administered medications for this encounter.    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    Comment: [employer]  Tobacco Use   Smoking status: Former    Packs/day: 1.00    Years: 20.00    Additional pack years: 0.00    Total pack years: 20.00    Types: Cigarettes    Quit date: 03/20/1978    Years since quitting: 44.3   Smokeless tobacco: Never   Tobacco comments:    Former smoker 07/04/22  Vaping Use   Vaping Use: Never used  Substance and Sexual Activity   Alcohol use: Not Currently    Alcohol/week: 1.0 standard drink of alcohol    Types: 1 Cans of beer per week   Drug use: No   Sexual activity: Not on file  Other Topics Concern   Not on file  Social History Narrative   Not on file   Social Determinants of Health   Financial Resource Strain: Not on file  Food Insecurity: Not on file  Transportation Needs: Not on file  Physical Activity: Not on file  Stress: Not on file  Social Connections: Not on file  Intimate Partner Violence: Not on file     ROS- All systems are reviewed and negative except as per the HPI above.  Physical Exam: Vitals:   07/04/22 0842  BP: 124/70  Pulse: 82  Weight: 88.6 kg  Height:  (1.753 m)   GEN- The patient is well appearing, alert and oriented x 3 today.   Head- normocephalic, atraumatic Eyes-  Sclera clear, conjunctiva pink Ears- hearing intact Oropharynx- clear Neck- supple, no JVP Lymph- no cervical lymphadenopathy Lungs-  Clear to ausculation bilaterally, normal work of breathing Heart- Regular rate and rhythm, no murmurs, rubs or gallops, PMI not laterally displaced GI- soft, NT, ND, + BS Extremities- no clubbing, cyanosis, or edema MS- no significant deformity or atrophy Skin- no rash or lesion Psych- euthymic mood, full affect Neuro- strength and sensation are intact   Wt Readings from Last 3 Encounters:  07/04/22 88.6 kg  06/25/22 93 kg  05/24/22 90.1 kg    EKG today demonstrates SR with sinus arrhythmia HR 82 PR 176 ms QRS 98 ms QT/Qtc 386/450 ms  Echo 02/24/2019 demonstrated:  1. Left ventricular ejection fraction,  by visual estimation, is 55 to  60%. The left ventricle has normal function. There is mildly increased  left ventricular hypertrophy.   2. Left ventricular diastolic parameters are consistent with Grade I  diastolic dysfunction (impaired relaxation).   3. The left ventricle has no regional wall motion abnormalities.   4. Global right ventricle has normal systolic function.The right  ventricular size is normal. No increase in right ventricular wall  thickness.   5. Left atrial size was normal.   6. Right atrial size was normal.   7. The mitral valve is normal in structure. No evidence of mitral valve  regurgitation. No evidence of mitral stenosis.   8. The tricuspid valve is normal in structure. Tricuspid valve  regurgitation is not demonstrated.   9. The aortic valve is tricuspid. Aortic valve regurgitation is not  visualized. Mild aortic valve sclerosis without stenosis.  10. There is mild dilatation of the aortic root measuring 40 mm.  11. TR signal is inadequate for assessing pulmonary artery systolic  pressure.   Epic records are reviewed at length today.  CHA2DS2-VASc Score = 4  The patient's score is based upon: CHF History: 0 HTN History: 1 Diabetes History: 0 Stroke History: 0 Vascular Disease History: 1 Age Score: 2 Gender Score: 0       ASSESSMENT AND  PLAN: Paroxysmal Atrial Fibrillation (ICD10:  I48.0) The patient's CHA2DS2-VASc score is 4, indicating a 4.8% annual risk of stroke.    Education provided about Afib with visual diagram. Discussion about medication treatments and ablation in detail. After discussion, we will proceed with conservative observation at this time.   2. Secondary Hypercoagulable State (ICD10:  D68.69) The patient is at significant risk for stroke/thromboembolism based upon his CHA2DS2-VASc Score of 4.  Continue Apixaban (Eliquis).   Review of records demonstrate he is currently on Eliquis 2.5 mg BID and I cannot determine why his original 5 mg dose was lowered. He does not currently meet criteria for the lower dose (only meets > 58 years of age; creatinine not greater than 1.5 and he weighs above 60 kg). I have sent a new Rx for Eliquis 5 mg BID to begin immediately since he was just cardioverted on 06/25/22.   No missed doses.  3. Obesity Body mass index is 28.86 kg/m. Lifestyle modification was discussed at length including regular exercise and weight reduction. Encouraged daily walking  4. HTN Well controlled today, no changes.    Follow up in 1 month Afib clinic.   Lake Bells, PA-C Afib Clinic [facility] [address] [place], [place] [address] [phone] 07/04/2022 9:35 AM

## 2022-07-04 NOTE — Patient Instructions (Signed)
Eliquis 5 mg twice a day.

## 2022-07-17 ENCOUNTER — Ambulatory Visit: Payer: Medicare Other | Admitting: General Practice

## 2022-08-03 ENCOUNTER — Ambulatory Visit (HOSPITAL_COMMUNITY)
Admission: RE | Admit: 2022-08-03 | Discharge: 2022-08-03 | Disposition: A | Discharge status: Home / Self Care | Payer: Medicare Other | Source: Ambulatory Visit | Attending: Internal Medicine | Admitting: Internal Medicine

## 2022-08-03 ENCOUNTER — Encounter (HOSPITAL_COMMUNITY): Payer: Self-pay | Admitting: Internal Medicine

## 2022-08-03 VITALS — BP 110/64 | HR 86 | Ht 69.0 in | Wt 198.8 lb

## 2022-08-03 DIAGNOSIS — Z87891 Personal history of nicotine dependence: Secondary | ICD-10-CM | POA: Diagnosis not present

## 2022-08-03 DIAGNOSIS — Z7901 Long term (current) use of anticoagulants: Secondary | ICD-10-CM | POA: Diagnosis not present

## 2022-08-03 DIAGNOSIS — Z713 Dietary counseling and surveillance: Secondary | ICD-10-CM | POA: Insufficient documentation

## 2022-08-03 DIAGNOSIS — I480 Paroxysmal atrial fibrillation: Secondary | ICD-10-CM

## 2022-08-03 DIAGNOSIS — R9431 Abnormal electrocardiogram [ECG] [EKG]: Secondary | ICD-10-CM | POA: Diagnosis not present

## 2022-08-03 DIAGNOSIS — I700 Atherosclerosis of aorta: Secondary | ICD-10-CM | POA: Insufficient documentation

## 2022-08-03 DIAGNOSIS — Z923 Personal history of irradiation: Secondary | ICD-10-CM | POA: Diagnosis not present

## 2022-08-03 DIAGNOSIS — E669 Obesity, unspecified: Secondary | ICD-10-CM | POA: Diagnosis not present

## 2022-08-03 DIAGNOSIS — Z6829 Body mass index (BMI) 29.0-29.9, adult: Secondary | ICD-10-CM | POA: Diagnosis not present

## 2022-08-03 DIAGNOSIS — Z8546 Personal history of malignant neoplasm of prostate: Secondary | ICD-10-CM | POA: Insufficient documentation

## 2022-08-03 DIAGNOSIS — D6869 Other thrombophilia: Secondary | ICD-10-CM | POA: Insufficient documentation

## 2022-08-03 DIAGNOSIS — I10 Essential (primary) hypertension: Secondary | ICD-10-CM | POA: Diagnosis not present

## 2022-08-03 NOTE — Progress Notes (Signed)
Primary Care Physician: Puschinsky, Adelfa Koh., MD Primary Cardiologist: Dr. Royann Shivers Primary Electrophysiologist: None Referring Physician: ED at [facility]   John Livingston is a 82 y.o. male with a history of paroxysmal Afib, paroxysmal SVT, aortic atherosclerosis, HTN, GERD, and prostate cancer s/p radiation who presents for consultation in the [facility].  The patient was initially diagnosed with atrial fibrillation in 12/2018. He is most recently s/p ED visit on 4/7 for Afib with RVR and was cardioverted in the ED. He was successfully converted to SR. Patient is on Eliquis for a CHADS2VASC score of 4.  On evaluation 07/04/22, he is doing well since ED visit. He has not had any additional episodes of Afib since then. He does note significant symptoms when he is in Afib. He has had about 3 total episodes of Afib including the first event in 2020. He is interested in considering treatment for Afib since the most recent episode was very symptomatic and required ED intervention.  He is compliant with anticoagulation and has not missed any doses. He has no bleeding concerns. Noted that he is taking Eliquis 2.5 mg BID but historically has been taking 5 mg BID. I cannot find why he was lowered to 2.5 mg dose.  On follow up 08/03/22, he is currently in NSR. His wife is here today and states on his behalf patient had 2 episodes of Afib in the past month. She states one of those episodes was "bad" in which he felt weak. Patient states both episodes lasted an hour or so. He has been compliant with his anticoagulation.   Today, he denies symptoms of palpitations, chest pain, shortness of breath, orthopnea, PND, lower extremity edema, dizziness, presyncope, syncope, snoring, daytime somnolence, bleeding, or neurologic sequela. The patient is tolerating medications without difficulties and is otherwise without complaint today.   Atrial Fibrillation Risk Factors:  he  does not have symptoms or diagnosis of sleep apnea. he does not have a history of rheumatic fever. he does not have a history of alcohol use. The patient does not have a history of early familial atrial fibrillation or other arrhythmias.  he has a BMI of Body mass index is 29.36 kg/m.Marland Kitchen Filed Weights   08/03/22 0833  Weight: 90.2 kg     Family History  Problem Relation Age of Onset   Asthma Son        as a child   Heart disease Mother    Prostate cancer Father     Atrial Fibrillation Management history:  Previous antiarrhythmic drugs: None Previous cardioversions: 2, most recently 06/25/22 Previous ablations: None Anticoagulation history: Eliquis 5 mg BID   Past Medical History:  Diagnosis Date   Baker's cyst of knee    left   Cancer of appendix (HCC)    GERD (gastroesophageal reflux disease)    History of kidney stones    Hypertension    Moderate persistent asthma    pt. denies   Paroxysmal atrial fibrillation (HCC)    Paroxysmal SVT (supraventricular tachycardia)    Prostate cancer (HCC)    Ureteral stone    Past Surgical History:  Procedure Laterality Date   APPENDECTOMY     cartilage surgery in nose     CHOLECYSTECTOMY N/A 03/06/2019   Procedure: LAPAROSCOPIC CHOLECYSTECTOMY;  Surgeon: Abigail Miyamoto, MD;  Location: [facility] OR;  Service: General;  Laterality: N/A;   IR EXCHANGE BILIARY DRAIN  02/21/2019   IR PERC CHOLECYSTOSTOMY  01/17/2019   IR  RADIOLOGIST EVAL & MGMT  02/19/2019    Current Outpatient Medications  Medication Sig Dispense Refill   acetaminophen (TYLENOL) 500 MG tablet Take 500 mg by mouth 3 (three) times daily as needed for headache (pain).     apixaban (ELIQUIS) 5 MG TABS tablet Take 1 tablet (5 mg total) by mouth 2 (two) times daily. 60 tablet 5   atorvastatin (LIPITOR) 10 MG tablet TAKE 1 TABLET BY MOUTH EVERY DAY 90 tablet 3   brimonidine (ALPHAGAN) 0.2 % ophthalmic solution Place 1 drop into both eyes 3 (three) times daily.      omeprazole (PRILOSEC) 40 MG capsule Take 40 mg by mouth every morning.     tamsulosin (FLOMAX) 0.4 MG CAPS capsule Take 0.4 mg by mouth in the morning and at bedtime.     No current facility-administered medications for this encounter.    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    Comment: [employer]  Tobacco Use   Smoking status: Former    Packs/day: 1.00    Years: 20.00    Additional pack years: 0.00    Total pack years: 20.00    Types: Cigarettes    Quit date: 03/20/1978    Years since quitting: 44.4   Smokeless tobacco: Never   Tobacco comments:    Former smoker 07/04/22  Vaping Use   Vaping Use: Never used  Substance and Sexual Activity   Alcohol use: Not Currently    Alcohol/week: 1.0 standard drink of alcohol    Types: 1 Cans of beer per week   Drug use: No   Sexual activity: Not on file  Other Topics Concern   Not on file  Social History Narrative   Not on file   Social Determinants of Health   Financial Resource Strain: Not on file  Food Insecurity: Not on file  Transportation Needs: Not on file  Physical Activity: Not on file  Stress: Not on file  Social Connections: Not on file  Intimate Partner Violence: Not on file     ROS- All systems are reviewed and negative except as per the HPI above.  Physical Exam: Vitals:   08/03/22 0833  BP: 110/64  Pulse: 86  Weight: 90.2 kg  Height: 5\' 9"  (1.753 m)    GEN- The patient is well appearing, alert and oriented x 3 today.   Head- normocephalic, atraumatic Eyes-  Sclera clear, conjunctiva pink Ears- hearing intact Oropharynx- clear Neck- supple, no JVP Lymph- no cervical lymphadenopathy Lungs- Clear to ausculation bilaterally, normal work of breathing Heart-  Regular rate and rhythm, no murmurs, rubs or gallops, PMI not laterally displaced GI- soft, NT, ND, + BS Extremities- no clubbing, cyanosis, or edema MS- no significant deformity or atrophy Skin- no rash or lesion Psych- euthymic mood, full affect Neuro- strength and sensation are intact   Wt Readings from Last 3 Encounters:  08/03/22 90.2 kg  07/04/22 88.6 kg  06/25/22 93 kg    EKG today demonstrates Vent. rate 86 BPM PR interval 190 ms QRS duration 98 ms QT/QTcB 414/495 ms P-R-T axes 53 80 12 Normal sinus rhythm Prolonged QT Abnormal ECG When compared with ECG of 04-Jul-2022 08:45, PREVIOUS ECG IS PRESENT  Echo  02/24/2019 demonstrated:  1. Left ventricular ejection fraction, by visual estimation, is 55 to  60%. The left ventricle has normal function. There is mildly increased  left ventricular hypertrophy.   2. Left ventricular diastolic parameters are consistent with Grade I  diastolic dysfunction (impaired relaxation).   3. The left ventricle has no regional wall motion abnormalities.   4. Global right ventricle has normal systolic function.The right  ventricular size is normal. No increase in right ventricular wall  thickness.   5. Left atrial size was normal.   6. Right atrial size was normal.   7. The mitral valve is normal in structure. No evidence of mitral valve  regurgitation. No evidence of mitral stenosis.   8. The tricuspid valve is normal in structure. Tricuspid valve  regurgitation is not demonstrated.   9. The aortic valve is tricuspid. Aortic valve regurgitation is not  visualized. Mild aortic valve sclerosis without stenosis.  10. There is mild dilatation of the aortic root measuring 40 mm.  11. TR signal is inadequate for assessing pulmonary artery systolic  pressure.   Epic records are reviewed at length today.  CHA2DS2-VASc Score = 4  The patient's score is based upon: CHF History: 0 HTN History: 1 Diabetes History: 0 Stroke History:  0 Vascular Disease History: 1 Age Score: 2 Gender Score: 0       ASSESSMENT AND PLAN: Paroxysmal Atrial Fibrillation (ICD10:  I48.0) The patient's CHA2DS2-VASc score is 4, indicating a 4.8% annual risk of stroke.    He is in NSR today. He has [employer] via wife to check his rhythm at home.  I revisited options for treatment including medication treatments and ablation in detail. After discussion, he does not want to proceed with any intervention and would just like to continue conservative observation. He seems reluctant to pursue any treatment for Afib at this time.  Continue with current medication regimen.    2. Secondary Hypercoagulable State (ICD10:  D68.69) The patient is at significant risk for stroke/thromboembolism based upon his CHA2DS2-VASc Score of 4.  Continue Apixaban (Eliquis).   He is taking Eliquis 5 mg BID.   No missed doses.  3. Obesity Body mass index is 29.36 kg/m. Lifestyle modification was discussed at length including regular exercise and weight reduction. Encouraged daily walking  4. HTN Well controlled today, no changes.    Follow up 6 months from Dr. Erin Hearing visit.    Lake Bells, PA-C Afib Clinic [facility] [address] [place], [place] [address] [phone] 08/03/2022 9:12 AM

## 2022-08-30 ENCOUNTER — Other Ambulatory Visit: Payer: Self-pay

## 2022-08-30 MED ORDER — ATORVASTATIN CALCIUM 10 MG PO TABS: 10.0000 mg | ORAL_TABLET | Freq: Every day | ORAL | 2 refills | Status: DC

## 2022-11-18 ENCOUNTER — Encounter (HOSPITAL_BASED_OUTPATIENT_CLINIC_OR_DEPARTMENT_OTHER): Payer: Self-pay

## 2022-11-18 ENCOUNTER — Other Ambulatory Visit: Payer: Self-pay

## 2022-11-18 ENCOUNTER — Emergency Department (HOSPITAL_BASED_OUTPATIENT_CLINIC_OR_DEPARTMENT_OTHER): Payer: Medicare Other

## 2022-11-18 ENCOUNTER — Emergency Department (HOSPITAL_BASED_OUTPATIENT_CLINIC_OR_DEPARTMENT_OTHER)
Admission: EM | Admit: 2022-11-18 | Discharge: 2022-11-18 | Disposition: A | Discharge status: Home / Self Care | Payer: Medicare Other | Attending: Emergency Medicine | Admitting: Emergency Medicine

## 2022-11-18 DIAGNOSIS — I10 Essential (primary) hypertension: Secondary | ICD-10-CM | POA: Insufficient documentation

## 2022-11-18 DIAGNOSIS — Z8546 Personal history of malignant neoplasm of prostate: Secondary | ICD-10-CM | POA: Diagnosis not present

## 2022-11-18 DIAGNOSIS — Z87442 Personal history of urinary calculi: Secondary | ICD-10-CM | POA: Diagnosis not present

## 2022-11-18 DIAGNOSIS — I4891 Unspecified atrial fibrillation: Secondary | ICD-10-CM

## 2022-11-18 DIAGNOSIS — J4540 Moderate persistent asthma, uncomplicated: Secondary | ICD-10-CM | POA: Diagnosis not present

## 2022-11-18 DIAGNOSIS — Z87891 Personal history of nicotine dependence: Secondary | ICD-10-CM | POA: Diagnosis not present

## 2022-11-18 DIAGNOSIS — Z7901 Long term (current) use of anticoagulants: Secondary | ICD-10-CM | POA: Diagnosis not present

## 2022-11-18 DIAGNOSIS — R0789 Other chest pain: Secondary | ICD-10-CM | POA: Diagnosis present

## 2022-11-18 LAB — BASIC METABOLIC PANEL
Anion gap: 10 (ref 5–15)
BUN: 15 mg/dL (ref 8–23)
CO2: 23 mmol/L (ref 22–32)
Calcium: 8.9 mg/dL (ref 8.9–10.3)
Chloride: 106 mmol/L (ref 98–111)
Creatinine, Ser: 1.1 mg/dL (ref 0.61–1.24)
GFR, Estimated: >60 mL/min (ref >60)
Glucose, Bld: 167 mg/dL — ABNORMAL HIGH (ref 70–99)
Potassium: 3.5 mmol/L (ref 3.5–5.1)
Sodium: 139 mmol/L (ref 135–145)

## 2022-11-18 LAB — CBC
HCT: 37.4 % — ABNORMAL LOW (ref 39.0–52.0)
Hemoglobin: 13 g/dL (ref 13.0–17.0)
MCH: 30.9 pg (ref 26.0–34.0)
MCHC: 34.8 g/dL (ref 30.0–36.0)
MCV: 88.8 fL (ref 80.0–100.0)
Platelets: 162 10*3/uL (ref 150–400)
RBC: 4.21 MIL/uL — ABNORMAL LOW (ref 4.22–5.81)
RDW: 14.2 % (ref 11.5–15.5)
WBC: 4.4 10*3/uL (ref 4.0–10.5)
nRBC: 0 % (ref 0.0–0.2)

## 2022-11-18 LAB — TROPONIN I (HIGH SENSITIVITY)
Troponin I (High Sensitivity): 5 ng/L (ref <18)
Troponin I (High Sensitivity): 9 ng/L (ref <18)

## 2022-11-18 LAB — MAGNESIUM: Magnesium: 2 mg/dL (ref 1.7–2.4)

## 2022-11-18 LAB — TSH: TSH: 5.509 u[IU]/mL — ABNORMAL HIGH (ref 0.350–4.500)

## 2022-11-18 MED ORDER — METOPROLOL TARTRATE 5 MG/5ML IV SOLN
5.0000 mg | Freq: Once | INTRAVENOUS | Status: AC
  Administered 2022-11-18: 5 mg via INTRAVENOUS
  Filled 2022-11-18: qty 5

## 2022-11-18 MED ORDER — SODIUM CHLORIDE 0.9 % IV BOLUS
1000.0000 mL | Freq: Once | INTRAVENOUS | Status: AC
  Administered 2022-11-18: 1000 mL via INTRAVENOUS

## 2022-11-18 MED ORDER — SODIUM CHLORIDE 0.9 % IV BOLUS
500.0000 mL | Freq: Once | INTRAVENOUS | Status: AC
  Administered 2022-11-18: 500 mL via INTRAVENOUS

## 2022-11-18 MED ORDER — METOPROLOL TARTRATE 25 MG PO TABS: 12.5000 mg | ORAL_TABLET | Freq: Every day | ORAL | 0 refills | Status: AC | PRN

## 2022-11-18 NOTE — Discharge Instructions (Addendum)
Please follow up with atrial fibrillation clinic/cardiologist next week at your scheduled appointment, please continue taking your eliquis as prescribed  May take metoprolol once daily as needed if you have reoccurrence rapid heart rate associated with A-fib.  Please check your blood pressure prior to taking medication and do not take metoprolol if your blood pressure is low  It was a pleasure caring for you today in the emergency department.  Please return to the emergency department for any worsening or worrisome symptoms.

## 2022-11-18 NOTE — ED Triage Notes (Addendum)
Pt reports weakness and heart pounding since 4 am. Chest tightness. Hx of Afib. On eliquis twice daily

## 2022-11-18 NOTE — ED Provider Notes (Signed)
[place] [facility] Provider Note  CSN: [id number] Arrival date & time: 11/18/22 4098  Chief Complaint(s) Atrial Fibrillation  HPI John Livingston is a 82 y.o. male with past medical history as below, significant for afib on DOAC, GERD, HTN, asthma who presents to the ED with complaint of afib.   He has history of paroxysmal atrial fibrillation, follows with A-fib clinic patient was seen 5/16 in the A-fib clinic.  Per chart review patient is only on Eliquis, no rate control or rhythm control medications.  Seems though he has been reluctant to pursue any further treatment of his A-fib and has elected to proceed with observation.  Reports today he began having elevated heart rate around 4 AM with chest tightness.  No nausea or vomiting.  No syncope or presyncope. Feeling tired since early this am when symptoms began, went to bed feeling okay last pm  Past Medical History Past Medical History:  Diagnosis Date   Baker's cyst of knee    left   Cancer of appendix (HCC)    GERD (gastroesophageal reflux disease)    History of kidney stones    Hypertension    Moderate persistent asthma    pt. denies   Paroxysmal atrial fibrillation (HCC)    Paroxysmal SVT (supraventricular tachycardia)    Prostate cancer (HCC)    Ureteral stone    Patient Active Problem List   Diagnosis Date Noted   Hypercoagulable state due to paroxysmal atrial fibrillation (HCC) 07/04/2022   Paroxysmal SVT (supraventricular tachycardia) 05/23/2022   Aortic atherosclerosis (HCC) 01/22/2021   Microcytic anemia 01/22/2021   Paroxysmal atrial fibrillation (HCC) 01/15/2019   Prostate cancer (HCC) 01/15/2019   Mild intermittent asthma 01/15/2019   Hypertension 01/15/2019   Moderate persistent asthma with reflux disease 04/18/2012   GERD (gastroesophageal reflux disease) 04/18/2012   Home Medication(s) Prior to Admission medications   Medication Sig Start Date End Date Taking?  Authorizing Provider  metoprolol tartrate (LOPRESSOR) 25 MG tablet Take 0.5 tablets (12.5 mg total) by mouth daily as needed (RAPID HEART RATE). 11/18/22  Yes Sloan Leiter, DO  acetaminophen (TYLENOL) 500 MG tablet Take 500 mg by mouth 3 (three) times daily as needed for headache (pain).    [provider]  apixaban (ELIQUIS) 5 MG TABS tablet Take 1 tablet (5 mg total) by mouth 2 (two) times daily. 07/04/22   Eustace Pen, PA-C  atorvastatin (LIPITOR) 10 MG tablet Take 1 tablet (10 mg total) by mouth daily. 08/30/22   Croitoru, Mihai, MD  brimonidine (ALPHAGAN) 0.2 % ophthalmic solution Place 1 drop into both eyes 3 (three) times daily. 02/28/19   [provider]  omeprazole (PRILOSEC) 40 MG capsule Take 40 mg by mouth every morning.    [provider]  tamsulosin (FLOMAX) 0.4 MG CAPS capsule Take 0.4 mg by mouth in the morning and at bedtime.    [provider]  Past Surgical History Past Surgical History:  Procedure Laterality Date   APPENDECTOMY     cartilage surgery in nose     CHOLECYSTECTOMY N/A 03/06/2019   Procedure: LAPAROSCOPIC CHOLECYSTECTOMY;  Surgeon: Abigail Miyamoto, MD;  Location: [facility] OR;  Service: General;  Laterality: N/A;   IR EXCHANGE BILIARY DRAIN  02/21/2019   IR PERC CHOLECYSTOSTOMY  01/17/2019   IR RADIOLOGIST EVAL & MGMT  02/19/2019   Family History Family History  Problem Relation Age of Onset   Asthma Son        as a child   Heart disease Mother    Prostate cancer Father     Social History Social History   Tobacco Use   Smoking status: Former    Current packs/day: 0.00    Average packs/day: 1 pack/day for 20.0 years (20.0 ttl pk-yrs)    Types: Cigarettes    Start date: 03/20/1958    Quit date: 03/20/1978    Years since quitting: 44.6   Smokeless tobacco: Never   Tobacco comments:     Former smoker 07/04/22  Vaping Use   Vaping status: Never Used  Substance Use Topics   Alcohol use: Not Currently    Alcohol/week: 1.0 standard drink of alcohol    Types: 1 Cans of beer per week   Drug use: No   Allergies Patient has no known allergies.  Review of Systems Review of Systems  Constitutional:  Positive for fatigue. Negative for chills and fever.  Respiratory:  Positive for chest tightness. Negative for shortness of breath.   Cardiovascular:  Negative for chest pain and palpitations.  Gastrointestinal:  Negative for abdominal pain and nausea.  All other systems reviewed and are negative.   Physical Exam Vital Signs  I have reviewed the triage vital signs BP (!) 99/57   Pulse (!) 102   Temp 98.2 F (36.8 C)   Resp (!) 25   Wt 87.5 kg   SpO2 97%   BMI 28.50 kg/m  Physical Exam Vitals and nursing note reviewed.  Constitutional:      General: He is not in acute distress.    Appearance: He is well-developed.  HENT:     Head: Normocephalic and atraumatic.     Right Ear: External ear normal.     Left Ear: External ear normal.     Mouth/Throat:     Mouth: Mucous membranes are moist.  Eyes:     General: No scleral icterus. Cardiovascular:     Rate and Rhythm: Tachycardia present. Rhythm irregular.     Pulses: Normal pulses.     Heart sounds: Normal heart sounds.  Pulmonary:     Effort: Pulmonary effort is normal. No respiratory distress.     Breath sounds: Normal breath sounds.  Abdominal:     General: Abdomen is flat.     Palpations: Abdomen is soft.     Tenderness: There is no abdominal tenderness.  Musculoskeletal:     Cervical back: No rigidity.     Right lower leg: No edema.     Left lower leg: No edema.  Skin:    General: Skin is warm and dry.     Capillary Refill: Capillary refill takes less than 2 seconds.  Neurological:     Mental Status: He is alert.  Psychiatric:        Mood and Affect: Mood normal.        Behavior: Behavior normal.      ED Results and Treatments Labs (all labs  ordered are listed, but only abnormal results are displayed) Labs Reviewed  BASIC METABOLIC PANEL - Abnormal; Notable for the following components:      Result Value   Glucose, Bld 167 (*)    All other components within normal limits  CBC - Abnormal; Notable for the following components:   RBC 4.21 (*)    HCT 37.4 (*)    All other components within normal limits  MAGNESIUM  TSH  TROPONIN I (HIGH SENSITIVITY)  TROPONIN I (HIGH SENSITIVITY)                                                                                                                          Radiology DG Chest Port 1 View  Result Date: 11/18/2022 CLINICAL DATA:  82 year old male with palpitations, chest tightness. Atrial fibrillation. RVR. EXAM: PORTABLE CHEST 1 VIEW COMPARISON:  Portable chest 06/25/2022 and earlier. FINDINGS: Portable AP semi upright view at 0728 hours. Calcified aortic atherosclerosis. Other mediastinal contours are within normal limits. Lung volumes are within normal limits. Allowing for portable technique the lungs are clear. No pneumothorax or pleural effusion, pulmonary edema. Visualized tracheal air column is within normal limits. No acute osseous abnormality identified. Paucity of bowel gas in the upper abdomen. IMPRESSION: No acute cardiopulmonary abnormality. Aortic Atherosclerosis (ICD10-I70.0). Electronically Signed   By: Odessa Fleming M.D.   On: 11/18/2022 07:42    Pertinent labs & imaging results that were available during my care of the patient were reviewed by me and considered in my medical decision making (see MDM for details).  Medications Ordered in ED Medications  metoprolol tartrate (LOPRESSOR) injection 5 mg (5 mg Intravenous Given 11/18/22 0729)  sodium chloride 0.9 % bolus 1,000 mL (0 mLs Intravenous Stopped 11/18/22 0835)  sodium chloride 0.9 % bolus 500 mL (0 mLs Intravenous Stopped 11/18/22 0958)                                                                                                                                      Procedures .Critical Care  Performed by: Sloan Leiter, DO Authorized by: Sloan Leiter, DO   Critical care provider statement:    Critical care time (minutes):  32   Critical care time was exclusive of:  Separately billable procedures and treating other patients   Critical care was necessary to treat or prevent imminent or life-threatening deterioration of the  following conditions:  Cardiac failure   Critical care was time spent personally by me on the following activities:  Development of treatment plan with patient or surrogate, discussions with consultants, evaluation of patient's response to treatment, examination of patient, ordering and review of laboratory studies, ordering and review of radiographic studies, ordering and performing treatments and interventions, pulse oximetry, re-evaluation of patient's condition and review of old charts Comments:     Afib RVR   (including critical care time)  Medical Decision Making / ED Course    Medical Decision Making:    QURON KELTY is a 82 y.o. male with past medical history as below, significant for afib on DOAC, GERD, HTN, asthma who presents to the ED with complaint of afib. . The complaint involves an extensive differential diagnosis and also carries with it a high risk of complications and morbidity.  Serious etiology was considered. Ddx includes but is not limited to: Differential includes all life-threatening causes for chest pain. This includes but is not exclusive to acute coronary syndrome, aortic dissection, pulmonary embolism, cardiac tamponade, community-acquired pneumonia, pericarditis, musculoskeletal chest wall pain, etc.   Complete initial physical exam performed, notably the patient  was A-fib with RVR noted, bp stable.    Reviewed and confirmed nursing documentation for past medical history, family history, social history.   Vital signs reviewed.   -Patient with A-fib with RVR on telemetry on arrival.  He is on a rate rhythm control medication.  He takes Eliquis and compliant -Note from recent A-fib clinic visit and recent ER visit where he underwent successful cardioversion -LVEF 55-60%, G1DD per echo 12/20 -will give lopressor/IVF, HR 140's afib   Clinical Course as of 11/18/22 1012  Sat Nov 18, 2022  0747 HR improved around 100 with 5mg  lopressor and 1L IVF [SG]  0850 Pt feeling better, sleeping [SG]  1011 HR 65, BP 103/64, he is feeling much better overall; feels back to normal.  [SG]    Clinical Course User Index [SG] Tanda Rockers A, DO     Feeling better on recheck, labs stable.  Rate control achieved with Lopressor.  Will give p.o. metoprolol for home.  He has follow-up with A-fib clinic next week.  Continue taking Eliquis.   The patient improved significantly and was discharged in stable condition. Detailed discussions were had with the patient regarding current findings, and need for close f/u with PCP or on call doctor. The patient has been instructed to return immediately if the symptoms worsen in any way for re-evaluation. Patient verbalized understanding and is in agreement with current care plan. All questions answered prior to discharge.                     Additional history obtained: -Additional history obtained from spouse -External records from outside source obtained and reviewed including: Chart review including previous notes, labs, imaging, consultation notes including  Medications, prior labs and imaging, prior visits   Lab Tests: -I ordered, reviewed, and interpreted labs.   The pertinent results include:   Labs Reviewed  BASIC METABOLIC PANEL - Abnormal; Notable for the following components:      Result Value   Glucose, Bld 167 (*)    All other components within normal limits  CBC - Abnormal; Notable for the following components:   RBC 4.21 (*)    HCT  37.4 (*)    All other components within normal limits  MAGNESIUM  TSH  TROPONIN I (HIGH SENSITIVITY)  TROPONIN  I (HIGH SENSITIVITY)    Notable for labs stable  EKG   EKG Interpretation Date/Time:  Saturday November 18 2022 07:10:47 EDT Ventricular Rate:  157 PR Interval:    QRS Duration:  102 QT Interval:  327 QTC Calculation: 529 R Axis:   88  Text Interpretation: Atrial fibrillation with rapid V-rate Ventricular premature complex Aberrant complex Borderline right axis deviation ST depression, probably rate related Confirmed by Tanda Rockers ([id number]) on 11/18/2022 8:11:21 AM         Imaging Studies ordered: I ordered imaging studies including CXR I independently visualized the following imaging with scope of interpretation limited to determining acute life threatening conditions related to emergency care; findings noted above, significant for no effusion or ptx I independently visualized and interpreted imaging. I agree with the radiologist interpretation   Medicines ordered and prescription drug management: Meds ordered this encounter  Medications   metoprolol tartrate (LOPRESSOR) injection 5 mg   sodium chloride 0.9 % bolus 1,000 mL   sodium chloride 0.9 % bolus 500 mL   metoprolol tartrate (LOPRESSOR) 25 MG tablet    Sig: Take 0.5 tablets (12.5 mg total) by mouth daily as needed (RAPID HEART RATE).    Dispense:  10 tablet    Refill:  0    -I have reviewed the patients home medicines and have made adjustments as needed   Consultations Obtained: na   Cardiac Monitoring: The patient was maintained on a cardiac monitor.  I personally viewed and interpreted the cardiac monitored which showed an underlying rhythm of: afib RVR  Social Determinants of Health:  Diagnosis or treatment significantly limited by social determinants of health: former smoker   Reevaluation: After the interventions noted above, I reevaluated the patient and found that they have improved  Co  morbidities that complicate the patient evaluation  Past Medical History:  Diagnosis Date   Baker's cyst of knee    left   Cancer of appendix (HCC)    GERD (gastroesophageal reflux disease)    History of kidney stones    Hypertension    Moderate persistent asthma    pt. denies   Paroxysmal atrial fibrillation (HCC)    Paroxysmal SVT (supraventricular tachycardia)    Prostate cancer (HCC)    Ureteral stone       Dispostion: Disposition decision including need for hospitalization was considered, and patient discharged from emergency department.    Final Clinical Impression(s) / ED Diagnoses Final diagnoses:  Atrial fibrillation with RVR (HCC)           Sloan Leiter, DO 11/18/22 1012

## 2022-11-18 NOTE — ED Notes (Signed)
Reviewed discharge instructions and recommendations with pt  pt states understanding and ambulatory at discharge

## 2022-11-23 NOTE — Progress Notes (Signed)
Cardiology Office Note:    Date:  11/24/2022   ID:  John Livingston, DOB Mar 01, 1941, MRN [id number]  PCP:  Puschinsky, Adelfa Koh., MD  Cardiologist:  John Livingston Electrophysiologist:  None   Referring MD: John Livingston.,*   Chief Complaint  Patient presents with   Atrial Fibrillation      History of Present Illness:    John Livingston is a 82 y.o. male with paroxysmal atrial fibrillation, incidentally discovered aortic atherosclerosis, periods of symptomatic orthostatic hypotension here for f/u.  He was in the emergency room 11/18/2022 for atrial fibrillation rapid ventricular response.  He felt weak and had vague chest discomfort.  On arrival to the ER his heart rate was 157 bpm.  She received a single dose of intravenous metoprolol and shortly thereafter was back in normal sinus rhythm with a heart rate of 65 bpm.  He is not currently taking any maintenance rate or rhythm control medications.  She felt poorly when he took daily metoprolol.  He is on chronic anticoagulation with Eliquis and has not had falls, injuries or bleeding problems.  His blood pressure is unusually high for him today.  Outside of the episodes of atrial fibrillation he feels well.  He continues to work full-time in his [employer] The patient specifically denies any chest pain at rest exertion, dyspnea at rest or with exertion, orthopnea, paroxysmal nocturnal dyspnea, syncope, palpitations, focal neurological deficits, intermittent claudication, lower extremity edema, unexplained weight gain, cough, hemoptysis or wheezing.   His abdominal CT is described "advanced atherosclerotic calcification of the aorta".  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  Additional problems include prostate cancer treated with radiation therapy, GERD on PPI, longstanding hypertension, mild hypercholesterolemia.Marland Kitchen  He does not have known CAD, PAD, diabetes mellitus and  does not smoke.  Past Medical History:  Diagnosis Date   Baker's cyst of knee    left   Cancer of appendix (HCC)    GERD (gastroesophageal reflux disease)    History of kidney stones    Hypertension    Moderate persistent asthma    pt. denies   Paroxysmal atrial fibrillation (HCC)    Paroxysmal SVT (supraventricular tachycardia)    Prostate cancer (HCC)    Ureteral stone     Past Surgical History:  Procedure Laterality Date   APPENDECTOMY     cartilage surgery in nose     CHOLECYSTECTOMY N/A 03/06/2019   Procedure: LAPAROSCOPIC CHOLECYSTECTOMY;  Surgeon: John Miyamoto, MD;  Location: [facility] OR;  Service: General;  Laterality: N/A;   IR EXCHANGE BILIARY DRAIN  02/21/2019   IR PERC CHOLECYSTOSTOMY  01/17/2019   IR RADIOLOGIST EVAL & MGMT  02/19/2019    Current Medications: Current Meds  Medication Sig   acetaminophen (TYLENOL) 500 MG tablet Take 500 mg by mouth 3 (three) times daily as needed for headache (pain).   apixaban (ELIQUIS) 5 MG TABS tablet Take 1 tablet (5 mg total) by mouth 2 (two) times daily.   atorvastatin (LIPITOR) 10 MG tablet Take 1 tablet (10 mg total) by mouth daily.   brimonidine (ALPHAGAN) 0.2 % ophthalmic solution Place 1 drop into both eyes 3 (three) times daily.   metoprolol tartrate (LOPRESSOR) 25 MG tablet Take 0.5 tablets (12.5 mg total) by mouth daily as needed (RAPID HEART RATE).   omeprazole (PRILOSEC) 40 MG capsule Take 40 mg by mouth every morning.   tamsulosin (FLOMAX) 0.4 MG CAPS capsule Take 0.4 mg by  mouth at bedtime.     Allergies:   Patient has no known allergies.   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    Comment: [employer]  Tobacco Use   Smoking status: Former    Current packs/day: 0.00    Average packs/day:  1 pack/day for 20.0 years (20.0 ttl pk-yrs)    Types: Cigarettes    Start date: 03/20/1958    Quit date: 03/20/1978    Years since quitting: 44.7   Smokeless tobacco: Never   Tobacco comments:    Former smoker 07/04/22  Vaping Use   Vaping status: Never Used  Substance and Sexual Activity   Alcohol use: Not Currently    Alcohol/week: 1.0 standard drink of alcohol    Types: 1 Cans of beer per week   Drug use: No   Sexual activity: Not on file  Other Topics Concern   Not on file  Social History Narrative   Not on file   Social Determinants of Health   Financial Resource Strain: Not on file  Food Insecurity: Not on file  Transportation Needs: Not on file  Physical Activity: Not on file  Stress: Not on file  Social Connections: Not on file     Family History: The patient's family history includes Asthma in his son; Heart disease in his mother; Prostate cancer in his father.  ROS:   Please see the history of present illness.    All other systems are reviewed and are negative.   EKGs/Labs/Other Studies Reviewed:    The following studies were reviewed today: ECHO 02/24/2019:  1. Left ventricular ejection fraction, by visual estimation, is 55 to  60%. The left ventricle has normal function. There is mildly increased  left ventricular hypertrophy.   2. Left ventricular diastolic parameters are consistent with Grade I  diastolic dysfunction (impaired relaxation).   3. The left ventricle has no regional wall motion abnormalities.   4. Global right ventricle has normal systolic function.The right  ventricular size is normal. No increase in right ventricular wall  thickness.   5. Left atrial size was normal.   6. Right atrial size was normal.   7. The mitral valve is normal in structure. No evidence of mitral valve  regurgitation. No evidence of mitral stenosis.   8. The tricuspid valve is normal in structure. Tricuspid valve  regurgitation is not demonstrated.   9. The aortic  valve is tricuspid. Aortic valve regurgitation is not  visualized. Mild aortic valve sclerosis without stenosis.  10. There is mild dilatation of the aortic root measuring 40 mm.  11. TR signal is inadequate for assessing pulmonary artery systolic  pressure.  EKG:  EKG is ordered today shows sinus rhythm, normal tracing.  ECG from 08 31 shows atrial fibrillation with rapid ventricular response.  Recent Labs: 05/24/2022: ALT 13 11/18/2022: BUN 15; Creatinine, Ser 1.10; Hemoglobin 13.0; Magnesium 2.0; Platelets 162; Potassium 3.5; Sodium 139; TSH 5.509  Recent Lipid Panel    Component Value Date/Time   CHOL 175 05/01/2019 0846   TRIG 90 05/01/2019 0846   HDL 37 (L) 05/01/2019 0846   CHOLHDL 4.7 05/01/2019 0846   LDLCALC 121 (H) 05/01/2019 0846    Physical Exam:    VS:  BP (!) 145/72   Pulse 65   Ht 5\' 9"  (1.753 m)   Wt 200 lb 9.6 oz (91 kg)   SpO2 94%   BMI 29.62 kg/m     Wt Readings from Last 3 Encounters:  11/24/22 200 lb 9.6 oz (91 kg)  11/18/22 193 lb (87.5 kg)  08/03/22 198 lb 12.8 oz (90.2 kg)     General: Alert, oriented x3, no distress, appears younger than stated age. Head: no evidence of trauma, PERRL, EOMI, no exophtalmos or lid lag, no myxedema, no xanthelasma; normal ears, nose and oropharynx Neck: normal jugular venous pulsations and no hepatojugular reflux; brisk carotid pulses without delay and no carotid bruits Chest: clear to auscultation, no signs of consolidation by percussion or palpation, normal fremitus, symmetrical and full respiratory excursions Cardiovascular: normal position and quality of the apical impulse, regular rhythm, normal first and second heart sounds, no murmurs, rubs or gallops Abdomen: no tenderness or distention, no masses by palpation, no abnormal pulsatility or arterial bruits, normal bowel sounds, no hepatosplenomegaly Extremities: no clubbing, cyanosis or edema; 2+ radial, ulnar and brachial pulses bilaterally; 2+ right femoral,  posterior tibial and dorsalis pedis pulses; 2+ left femoral, posterior tibial and dorsalis pedis pulses; no subclavian or femoral bruits Neurological: grossly nonfocal Psych: Normal mood and affect    ASSESSMENT:    1. Paroxysmal atrial fibrillation (HCC)   2. Symptomatic hypotension   3. Acquired thrombophilia (HCC)   4. Aortic atherosclerosis (HCC)   5. Prostate cancer (HCC)   6. Renal mass      PLAN:    In order of problems listed above:  AFib: He has very infrequent symptomatic episodes of atrial fibrillation.  He does not tolerate daily doses of AV node blocking agents due to hypotension and bradycardia.  We discussed the fact that if he has recurrent rapid palpitations and does not have serious symptoms (chest pain, dyspnea, syncope) he can try to manage this at home with oral metoprolol.  He can take increasing doses of metoprolol tartrate (25 mg every 30 minutes as long as his systolic blood pressure is over 100 mm Hg. he should then go to the emergency room if he develops any of the above symptoms or if he cannot get the heart rate less than 110 bpm after several hours.  We also reviewed the fact that we do have antiarrhythmic medications, pacemaker implantation to allow daily doses of rate control medication or even ablation as options for treatment, but he wants to be as conservative as possible. Previous monitor showed extremely frequent episodes of paroxysmal atrial tachycardia, which may represent the trigger for his atrial fibrillation. Since his symptoms are very infrequent I think a conservative approach at this time is reasonable.   CHA2DS2-VASc 4 (age 67, hypertension, aortic atherosclerosis).  Hypotension: Intermittent, possibly related to his alpha-blocker.  Has not been a serious problem recently.  PYP scan showed no evidence of amyloidosis. Anticoagulation: Eliquis is well-tolerated.  He has not had any bleeding problems.  Has near normal renal function. Aortic  atherosclerosis: on statin, monitored by PCP.  He does not have angina or claudication. On statin.  Overdue for lipid profile. Prostate cancer: s/p XRT. 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  Followed by Dr. Merry Lofty at [facility]   Medication Adjustments/Labs and Tests Ordered: Current medicines are reviewed at length with the patient today.  Concerns regarding medicines are outlined above.  Orders Placed This Encounter  Procedures   EKG 12-Lead  No orders of the defined types were placed in this encounter.    Patient Instructions  Medication Instructions:  No changes *If you need a refill on your cardiac medications before your next appointment, please call your pharmacy*  Follow-Up: At [facility], you and your health needs are our priority.  As part of our continuing mission to provide you with exceptional heart care, we have created designated Provider Care Teams.  These Care Teams include your primary Cardiologist (physician) and Advanced Practice Providers (APPs -  Physician Assistants and Nurse Practitioners) who all work together to provide you with the care you need, when you need it.  We recommend signing up for the patient portal called "MyChart".  Sign up information is provided on this After Visit Summary.  MyChart is used to connect with patients for Virtual Visits (Telemedicine).  Patients are able to view lab/test results, encounter notes, upcoming appointments, etc.  Non-urgent messages can be sent to your provider as well.   To learn more about what you can do with MyChart, go to [email or web address].    Your next appointment:   1 year(s)  Provider:   Thurmon Fair, MD        Signed, Thurmon Fair, MD  11/24/2022 9:32 AM    [facility]

## 2022-11-24 ENCOUNTER — Ambulatory Visit: Payer: Medicare Other | Attending: Cardiovascular Disease | Admitting: Cardiovascular Disease

## 2022-11-24 ENCOUNTER — Encounter: Payer: Self-pay | Admitting: Cardiovascular Disease

## 2022-11-24 VITALS — BP 145/72 | HR 65 | Ht 69.0 in | Wt 200.6 lb

## 2022-11-24 DIAGNOSIS — D6869 Other thrombophilia: Secondary | ICD-10-CM | POA: Diagnosis present

## 2022-11-24 DIAGNOSIS — I480 Paroxysmal atrial fibrillation: Secondary | ICD-10-CM

## 2022-11-24 DIAGNOSIS — N2889 Other specified disorders of kidney and ureter: Secondary | ICD-10-CM

## 2022-11-24 DIAGNOSIS — I959 Hypotension, unspecified: Secondary | ICD-10-CM | POA: Diagnosis present

## 2022-11-24 DIAGNOSIS — I700 Atherosclerosis of aorta: Secondary | ICD-10-CM

## 2022-11-24 DIAGNOSIS — C61 Malignant neoplasm of prostate: Secondary | ICD-10-CM

## 2022-11-24 NOTE — Patient Instructions (Signed)

## 2023-01-15 ENCOUNTER — Telehealth: Payer: Self-pay

## 2023-01-15 NOTE — Telephone Encounter (Signed)
I think he is at low risk for cardiovascular complications with blepharoplasty.  No additional testing recommended.

## 2023-01-15 NOTE — Telephone Encounter (Signed)
Patient Name: John Livingston  DOB: 05-Apr-1940 MRN: [id number]  Primary Cardiologist: Thurmon Fair, MD  Chart reviewed as part of pre-operative protocol coverage. Given past medical history and time since last visit, based on ACC/AHA guidelines, JAMESPATRICK CHARTRAND is at acceptable risk for the planned procedure without further cardiovascular testing.   Per office protocol, patient can hold Eliquis for 1-2 days prior to procedure.   I will route this recommendation to the requesting party via Epic fax function and remove from pre-op pool.  Please call with questions.  Napoleon Form, Leodis Rains, NP 01/15/2023, 3:54 PM

## 2023-01-15 NOTE — Telephone Encounter (Signed)
Pharmacy please advise on holding Eliquis prior to blepharoplasty scheduled for TBD. Thank you.

## 2023-01-15 NOTE — Telephone Encounter (Signed)
Good Afternoon Dr. Royann Shivers  We have received a surgical clearance request for John Livingston for a.m. bilateral lid blepharoplasty. They were seen recently in clinic on 11/24/2022.  He has a PMH of atrial fibrillation, aortic atherosclerosis, symptomatic orthostatic hypotension.  Can you please comment on surgical clearance and if patient will require any additional testing. Please forward you guidance and recommendations to P CV DIV PREOP.  Thank you, Robin Searing, NP

## 2023-01-15 NOTE — Telephone Encounter (Signed)
Patient with diagnosis of afib on Eliquis for anticoagulation.    Procedure: bilateral upper lip blepharoplasty  Date of procedure: TBD  CHA2DS2-VASc Score = 4  This indicates a 4.8% annual risk of stroke. The patient's score is based upon: CHF History: 0 HTN History: 1 Diabetes History: 0 Stroke History: 0 Vascular Disease History: 1 Age Score: 2 Gender Score: 0   CrCl 33mL/min using adjusted body weight Platelet count 162K  Per office protocol, patient can hold Eliquis for 1-2 days prior to procedure.    **This guidance is not considered finalized until pre-operative APP has relayed final recommendations.**

## 2023-01-15 NOTE — Telephone Encounter (Signed)
..  Pre-operative Risk Assessment    Patient Name: John Livingston  DOB: [birth year] MRN: [id number]      Request for Surgical Clearance    Procedure:   bilateral upper lip blepharoplasty  Date of Surgery:  Clearance TBD                                 Surgeon:  dr Kelli Hope Surgeon's Group or Practice Name:  [facility] Phone number:  [phone] Fax number:  [phone]   Type of Clearance Requested:   - Medical  - Pharmacy:  Hold Apixaban (Eliquis)     Type of Anesthesia:  Not Indicated   Additional requests/questions:   last o/v 11/24/22, no new appt  Signed, Renee Ramus   01/15/2023, 2:01 PM

## 2023-06-10 ENCOUNTER — Other Ambulatory Visit: Payer: Self-pay | Admitting: Cardiovascular Disease

## 2023-06-12 ENCOUNTER — Other Ambulatory Visit: Payer: Self-pay | Admitting: Cardiovascular Disease

## 2023-06-12 ENCOUNTER — Telehealth: Payer: Self-pay | Admitting: Cardiovascular Disease

## 2023-06-12 MED ORDER — ALFUZOSIN HCL ER 10 MG PO TB24: 10.0000 mg | ORAL_TABLET | Freq: Every day | ORAL | 2 refills | Status: DC

## 2023-06-12 NOTE — Telephone Encounter (Signed)
 Patient identification verified by 2 forms. Marilynn Rail, RN    Called and spoke to patients spouse Jamesetta So  Informed Jamesetta So Rx for Alfuzosin sent to pharmacy  Allison Quarry to call in June to schedule September follow up  Jamesetta So agrees with plan, no questions at this time

## 2023-06-12 NOTE — Telephone Encounter (Signed)
 Patient identification verified by 2 forms. Marilynn Rail, RN    Called and spoke to patient spouse Ellwood Dense states:   -Dr. Royann Shivers discontinued Flomax and started alfuzosin   -does not recall alfuzosin being discontinued  -would like to clarify medications  Informed Jamesetta So message sent to Dr. Royann Shivers

## 2023-06-12 NOTE — Telephone Encounter (Signed)
 He can take either alfuzosin (Uroxatral) or tamsulosin (Flomax), whichever works best for him. Alfuzosin may cause less orthostatic dizziness than tamsulosin, which is why I think we made the change.  But if he does not notice a difference, tamsulosin may be cheaper.

## 2023-06-12 NOTE — Telephone Encounter (Signed)
*  STAT* If patient is at the pharmacy, call can be transferred to refill team.   1. Which medications need to be refilled? (please list name of each medication and dose if known)  tamsulosin (FLOMAX) 0.4 MG CAPS capsule  2. Which pharmacy/location (including street and city if local pharmacy) is medication to be sent to? [id number] - [place], [place] - [address] AT [address]OSS [address] [phone]   3. Do they need a 30 day or 90 day supply? 90

## 2023-06-12 NOTE — Telephone Encounter (Signed)
 Pt c/o medication issue:  1. Name of Medication: alfuzosin (UROXATRAL) 10 MG 24 hr tablet   2. How are you currently taking this medication (dosage and times per day)?   3. Are you having a reaction (difficulty breathing--STAT)? No  4. What is your medication issue? Pt's spouse is requesting a callback regarding her wanting to know why this medication refill was denied. Please advise

## 2023-06-12 NOTE — Telephone Encounter (Signed)
 This Pt of Dr. Royann Shivers is asking for a refill of Tamsulosin. This was refilled by [facility] last. As this is not a Cardiac med, does Dr. Royann Shivers want to refill? Please advise

## 2023-06-14 NOTE — Telephone Encounter (Signed)
 Yes, please refill

## 2023-06-14 NOTE — Telephone Encounter (Signed)
 Yes, we filled alfuzosin 3/25. Take tamsulosin off list

## 2023-06-14 NOTE — Telephone Encounter (Signed)
 Hold on. Sorry, but did we not switch him to alfuzosin? There should be some recent notes

## 2023-07-08 ENCOUNTER — Other Ambulatory Visit (HOSPITAL_COMMUNITY): Payer: Self-pay | Admitting: Internal Medicine

## 2023-07-09 NOTE — Telephone Encounter (Signed)
 Prescription refill request for Eliquis  received. Indication: PAF Last office visit: 11/24/22  John Stable Croitoru MD Scr: 1.10 on 11/18/22  Epic Age: 83 Weight: 91kg  Based on above findings Eliquis  5mg  twice daily is the appropriate dose.  Refill approved.

## 2023-07-10 ENCOUNTER — Other Ambulatory Visit: Payer: Self-pay | Admitting: Cardiovascular Disease

## 2023-11-28 ENCOUNTER — Encounter: Payer: Self-pay | Admitting: Cardiovascular Disease

## 2024-01-02 ENCOUNTER — Other Ambulatory Visit: Payer: Self-pay | Admitting: Cardiovascular Disease

## 2024-01-02 DIAGNOSIS — I480 Paroxysmal atrial fibrillation: Secondary | ICD-10-CM

## 2024-01-02 DIAGNOSIS — I700 Atherosclerosis of aorta: Secondary | ICD-10-CM

## 2024-01-02 MED ORDER — ATORVASTATIN CALCIUM 10 MG PO TABS
10.0000 mg | ORAL_TABLET | Freq: Every day | ORAL | 0 refills | Status: DC
Start: 2024-01-02 — End: 2024-01-30

## 2024-01-02 NOTE — Telephone Encounter (Signed)
 Prescription refill request for Eliquis  received. Indication: a fib Last office visit: 11/24/22, overdue Scr: overdue Age: 83 Weight: 91kg  Patient overdue for OV and lab work. 1 month supply sent and message sent to scheduling

## 2024-01-28 ENCOUNTER — Other Ambulatory Visit: Payer: Self-pay | Admitting: Cardiovascular Disease

## 2024-01-28 DIAGNOSIS — I700 Atherosclerosis of aorta: Secondary | ICD-10-CM

## 2024-01-30 ENCOUNTER — Other Ambulatory Visit: Payer: Self-pay | Admitting: Cardiovascular Disease

## 2024-01-30 DIAGNOSIS — I480 Paroxysmal atrial fibrillation: Secondary | ICD-10-CM

## 2024-01-30 MED ORDER — APIXABAN 5 MG PO TABS: 5.0000 mg | ORAL_TABLET | Freq: Two times a day (BID) | ORAL | 1 refills | Status: DC

## 2024-01-30 NOTE — Telephone Encounter (Signed)
 Prescription refill request for John Livingston  received. Indication: PAF Last office visit: 11/24/22  CHRISTELLA Croitoru MD Scr: 1.10 on 11/18/22  Epic Age: 83 Weight: 91kg  Based on above findings John Livingston  5mg  twice daily is the appropriate dose.  Pt is past due for appt with Dr Francyne and lab work.  Message sent to schedulers.  Refill approved x 2

## 2024-02-07 ENCOUNTER — Other Ambulatory Visit: Payer: Self-pay | Admitting: Cardiovascular Disease

## 2024-02-07 DIAGNOSIS — I700 Atherosclerosis of aorta: Secondary | ICD-10-CM

## 2024-02-20 ENCOUNTER — Other Ambulatory Visit: Payer: Self-pay | Admitting: Cardiovascular Disease

## 2024-02-20 DIAGNOSIS — I700 Atherosclerosis of aorta: Secondary | ICD-10-CM

## 2024-02-27 ENCOUNTER — Other Ambulatory Visit: Payer: Self-pay | Admitting: Cardiovascular Disease

## 2024-03-01 ENCOUNTER — Other Ambulatory Visit: Payer: Self-pay | Admitting: Cardiovascular Disease

## 2024-03-01 DIAGNOSIS — I700 Atherosclerosis of aorta: Secondary | ICD-10-CM

## 2024-03-16 ENCOUNTER — Other Ambulatory Visit: Payer: Self-pay | Admitting: Cardiovascular Disease

## 2024-03-16 DIAGNOSIS — I700 Atherosclerosis of aorta: Secondary | ICD-10-CM

## 2024-03-18 NOTE — Telephone Encounter (Signed)
 Pt of Dr. Sherrell. Passed 3rd attempt. Does Dr. Francyne want to refill? Please advise.

## 2024-03-27 ENCOUNTER — Other Ambulatory Visit: Payer: Self-pay | Admitting: Cardiovascular Disease

## 2024-03-27 DIAGNOSIS — I480 Paroxysmal atrial fibrillation: Secondary | ICD-10-CM

## 2024-03-27 NOTE — Telephone Encounter (Signed)
 Overdue for a F/U. Overdue Labs.

## 2024-03-31 NOTE — Telephone Encounter (Signed)
 S/w the patient. Appt made with NP 04/15/24- Informed him of location. Will send in enough meds for him to make it to the appt, then they can send in more refills after appt. He verbalized understanding of all information.

## 2024-04-11 ENCOUNTER — Encounter: Payer: Self-pay | Admitting: *Deleted

## 2024-04-15 ENCOUNTER — Ambulatory Visit: Admitting: Emergency Medicine

## 2024-05-30 ENCOUNTER — Ambulatory Visit: Admitting: Emergency Medicine
